# Patient Record
Sex: Male | Born: 1935 | Race: White | Hispanic: No | Marital: Married | Smoking: Former smoker
Health system: Southern US, Community
[De-identification: ages and names within clinical notes are randomized; demographics above are authoritative.]

## PROBLEM LIST (undated history)

## (undated) DIAGNOSIS — N1330 Unspecified hydronephrosis: Secondary | ICD-10-CM

## (undated) DIAGNOSIS — N400 Enlarged prostate without lower urinary tract symptoms: Secondary | ICD-10-CM

## (undated) DIAGNOSIS — I639 Cerebral infarction, unspecified: Secondary | ICD-10-CM

## (undated) DIAGNOSIS — E785 Hyperlipidemia, unspecified: Secondary | ICD-10-CM

## (undated) DIAGNOSIS — I2510 Atherosclerotic heart disease of native coronary artery without angina pectoris: Secondary | ICD-10-CM

## (undated) DIAGNOSIS — I10 Essential (primary) hypertension: Secondary | ICD-10-CM

## (undated) DIAGNOSIS — E119 Type 2 diabetes mellitus without complications: Secondary | ICD-10-CM

## (undated) DIAGNOSIS — R339 Retention of urine, unspecified: Secondary | ICD-10-CM

## (undated) DIAGNOSIS — N2889 Other specified disorders of kidney and ureter: Secondary | ICD-10-CM

## (undated) DIAGNOSIS — M1990 Unspecified osteoarthritis, unspecified site: Secondary | ICD-10-CM

## (undated) HISTORY — DX: Atherosclerotic heart disease of native coronary artery without angina pectoris: I25.10

## (undated) HISTORY — PX: BACK SURGERY: SHX140

## (undated) HISTORY — DX: Type 2 diabetes mellitus without complications: E11.9

## (undated) HISTORY — DX: Other specified disorders of kidney and ureter: N28.89

## (undated) HISTORY — DX: Unspecified hydronephrosis: N13.30

## (undated) HISTORY — DX: Cerebral infarction, unspecified: I63.9

## (undated) HISTORY — DX: Retention of urine, unspecified: R33.9

## (undated) HISTORY — DX: Benign prostatic hyperplasia without lower urinary tract symptoms: N40.0

## (undated) HISTORY — DX: Essential (primary) hypertension: I10

## (undated) HISTORY — DX: Unspecified osteoarthritis, unspecified site: M19.90

## (undated) HISTORY — DX: Hyperlipidemia, unspecified: E78.5

---

## 2005-04-02 ENCOUNTER — Ambulatory Visit: Payer: Self-pay | Admitting: Internal Medicine

## 2008-12-03 ENCOUNTER — Ambulatory Visit: Payer: Self-pay | Admitting: Neurology

## 2013-11-28 ENCOUNTER — Observation Stay: Payer: Self-pay | Admitting: Internal Medicine

## 2013-11-28 LAB — COMPREHENSIVE METABOLIC PANEL
Albumin: 3.5 g/dL (ref 3.4–5.0)
Alkaline Phosphatase: 69 U/L
Calcium, Total: 8.2 mg/dL — ABNORMAL LOW (ref 8.5–10.1)
Chloride: 108 mmol/L — ABNORMAL HIGH (ref 98–107)
Co2: 21 mmol/L (ref 21–32)
Creatinine: 0.71 mg/dL (ref 0.60–1.30)
Glucose: 132 mg/dL — ABNORMAL HIGH (ref 65–99)
Osmolality: 277 (ref 275–301)
Sodium: 135 mmol/L — ABNORMAL LOW (ref 136–145)

## 2013-11-28 LAB — CBC
HGB: 14.4 g/dL (ref 13.0–18.0)
MCH: 31.3 pg (ref 26.0–34.0)
MCHC: 34.1 g/dL (ref 32.0–36.0)
MCV: 92 fL (ref 80–100)
Platelet: 99 10*3/uL — ABNORMAL LOW (ref 150–440)
RBC: 4.6 10*6/uL (ref 4.40–5.90)
RDW: 13.2 % (ref 11.5–14.5)

## 2013-11-28 LAB — TROPONIN I
Troponin-I: <0.02 ng/mL
Troponin-I: <0.02 ng/mL

## 2013-11-28 LAB — URINALYSIS, COMPLETE
Bacteria: NONE SEEN
Glucose,UR: NEGATIVE mg/dL (ref 0–75)
Specific Gravity: 1.019 (ref 1.003–1.030)
WBC UR: NONE SEEN /HPF (ref 0–5)

## 2013-11-28 LAB — CK-MB
CK-MB: 1.5 ng/mL (ref 0.5–3.6)
CK-MB: 1.6 ng/mL (ref 0.5–3.6)

## 2013-11-29 LAB — CK-MB: CK-MB: 1.6 ng/mL (ref 0.5–3.6)

## 2013-11-29 LAB — TROPONIN I: Troponin-I: <0.02 ng/mL

## 2013-11-29 LAB — PLATELET COUNT: Platelet: 100 10*3/uL — ABNORMAL LOW (ref 150–440)

## 2013-12-03 LAB — CULTURE, BLOOD (SINGLE)

## 2014-06-01 DIAGNOSIS — M5416 Radiculopathy, lumbar region: Secondary | ICD-10-CM | POA: Insufficient documentation

## 2014-06-01 DIAGNOSIS — M545 Low back pain, unspecified: Secondary | ICD-10-CM | POA: Insufficient documentation

## 2014-06-18 ENCOUNTER — Ambulatory Visit: Payer: Self-pay | Admitting: Nurse Practitioner

## 2014-06-26 ENCOUNTER — Ambulatory Visit: Payer: Self-pay | Admitting: Family Medicine

## 2014-08-23 DIAGNOSIS — M5416 Radiculopathy, lumbar region: Secondary | ICD-10-CM | POA: Insufficient documentation

## 2014-08-23 DIAGNOSIS — M5136 Other intervertebral disc degeneration, lumbar region: Secondary | ICD-10-CM | POA: Insufficient documentation

## 2015-04-19 NOTE — H&P (Signed)
PATIENT NAME:  Matthew Schultz, Matthew Schultz MR#:  [id number] DATE OF BIRTH:  04/08/1932  DATE OF ADMISSION:  11/28/2013  PRIMARY CARE PHYSICIAN: Dr. Jarome Lamas.   CHIEF COMPLAINT: Dizziness, nausea and vomiting.   HISTORY OF PRESENT ILLNESS: Matthew Schultz is a 81 year old Caucasian gentleman with history of hypertension and TIA in the very remote past without any neuro deficit who complains of chronic dizziness on and off. Comes in today accompanied by his daughter with increasing dizziness, complaining of room spinning and some blurred vision associated with it. He vomited x 2 in the Emergency Room. ER physician, when he evaluated, the patient had some pronator drift on the left side along with weakness. However, during my evaluation, I could not see any pronator drift and the patient did not appear to have any weakness in his left upper or lower extremities.   Please note, the patient is quite a poor historian. He is hard of hearing. Daughter does not have much information regarding the patient's current symptoms.   The patient denies any falls or any trouble walking. He did fill unsteady secondary to dizziness today. Denies any ear pain or any ear discharge or upper respiratory tract infection. Given symptoms with ongoing dizziness, vomiting and possible TIA, the patient is being admitted for further evaluation and management.   PAST MEDICAL HISTORY:  1. Hypertension.  2. TIA. Marland Kitchen   ALLERGIES: No known drug allergies.   MEDICATIONS:  1. Vitamin B12 2000 mcg p.o. daily.  2. Os-Cal D 500 p.o. daily.  3. Motrin 200 mg 1 every 6 hours.  4. Meloxicam 7.5 mg p.o. daily.  5. Melatonin 3 mg daily at bedtime.  6. Garlique 1 tablet daily.  7. Gabapentin 300 mg daily at bedtime.  8. Doxazosin 2 mg p.o. daily at bedtime.  9. Centrum Silver 1 p.o. daily.  10. Aspirin 81 mg p.o. daily.   FAMILY HISTORY: Positive for hypertension and heart disease.   SOCIAL HISTORY: Lives at home with his daughter. Former  smoker, nonalcoholic.   REVIEW OF SYSTEMS:   CONSTITUTIONAL: No fever, fatigue. Positive for weakness.  EYES: Positive for some blurred vision during dizzy spell. No double vision, glaucoma or cataracts.  EARS, NOSE, THROAT: No tinnitus, ear pain, hearing loss or sinus pain.  RESPIRATORY: No cough, wheeze, hemoptysis, dyspnea or COPD.   CARDIOVASCULAR: No chest pain, orthopnea, edema or hypertension.  GASTROINTESTINAL: Positive for nausea and vomiting. No abdominal pain, melena or hematemesis.  GENITOURINARY: No dysuria or hematuria.  ENDOCRINE: No polyuria, nocturia or thyroid problems.  HEMATOLOGY: No anemia or easy bruising.  SKIN: No acne or rash.  MUSCULOSKELETAL: Positive for arthritis. No swelling or gout.  NEUROLOGIC: Positive for dizziness and weakness.   PSYCHIATRIC: No anxiety, depression or bipolar disorder.   All other systems reviewed and negative.   PHYSICAL EXAMINATION:  GENERAL: The patient is awake, alert, oriented x 3, not in acute distress.  VITAL SIGNS: Afebrile. Pulse is 50. Blood pressure is 118/64. Sats are 97% on room air.  HEENT: Atraumatic, normocephalic. RIGHT EYE: The patient has squinted eye. PERRLA. EOM intact. Oral mucosa is moist.  NECK: Supple. No JVD. No carotid bruit.  RESPIRATORY: Clear to auscultation bilaterally. No rales, rhonchi, respiratory distress or labored breathing.  HEART: Both of the heart sounds are normal. Rate, rhythm is regular. PMI not lateralized. Chest is nontender.  EXTREMITIES: Good pedal pulses. Good femoral pulses. No lower extremity edema.  ABDOMEN: Soft, benign, nontender. No organomegaly. Positive bowel sounds.  NEUROLOGIC: Grossly  intact cranial nerves II through XII. No motor or sensory deficit. The patient does not have any pronator drift in the left upper and lower extremities. The patient has reflexes 1+ in both upper and lower extremities. Normal sensory exam. Plantars are flexor bilaterally. No facial droop.  SKIN:  Warm and dry.   LABORATORY AND DIAGNOSTIC DATA: EKG shows sinus bradycardia.   UA negative for urinary tract infection.   CHEST X-RAY: No active disease, mild cardiomegaly.   CT OF THE HEAD: Shows no evidence of acute skull fractures. Mild diffuse cerebellar and cerebral atrophy. No evidence of ischemic or hemorrhagic infarction.   White count is 7.2, H and H is 14.4 and 42.2, platelet count is 99. Basic metabolic panel within normal limits except calcium of 8.2 and chloride of 108. Troponin is 0.02.   ASSESSMENT AND PLAN: Matthew Schultz is a 81 year old with history of hypertension and transient ischemic attack in the remote past. Comes in with:  1. Dizziness, acute on chronic, along with nausea and vomiting: Given history of transient ischemic attack in the past, we need to rule out another transient ischemic attack. Will continue aspirin for now. Will check MRI of the brain in the morning. Will continue neuro checks every 4 hourly. For dizziness, I will give him some meclizine around the clock along with Zofran for vomiting. Continue monitoring on telemetry.  2. Hypertension: The patient has relative low blood pressure at this time. I will hold off on his doxazosin.  3. Acute on chronic dizziness: This could be a possibility or vertigo. However, need to rule out any cerebellar stroke/transient ischemic attack. Will give him meclizine around the clock.  4. Generalized weakness: Will have physical therapy see the patient.  5. Further workup according to the patient's clinical course. Hospital admission plan was discussed with the patient and the patient's family members.   TIME SPENT: 50 minutes.   ____________________________ Hart Rochester Posey Pronto, MD sap:gb D: 11/28/2013 18:36:32 ET T: 11/28/2013 18:48:28 ET JOB#: [id number]  cc: Debanhi Blaker A. Posey Pronto, MD, <Dictator> Irven Easterly. Kary Kos, MD Ilda Basset MD ELECTRONICALLY SIGNED 12/08/2013 11:08

## 2015-04-19 NOTE — Discharge Summary (Signed)
PATIENT NAME:  Matthew Schultz, Matthew Schultz MR#:  [id number] DATE OF BIRTH:  02/02/1932  DATE OF ADMISSION:  11/28/2013 DATE OF DISCHARGE:  11/29/2013  ADMITTING DIAGNOSIS: Dizziness.   DISCHARGE DIAGNOSES: 1.  Dizziness.  2.  Bradycardia.  3.  Relative hypotension, resolved with IV fluid administration.  4.  Likely mild dehydration.  5.  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  6.  History of hypertension, transient ischemic attack.   DISCHARGE CONDITION: Stable.   DISCHARGE MEDICATIONS:  1.  The patient is to continue Motrin 200 mg every 6 hours as needed.  2.  Os-Cal with vitamin D 1 tablet once daily.  3.  Centrum Silver once daily.  4.  Melatonin 3 mg p.o. at bedtime.  5.  Gabapentin 300 mg p.o. at bedtime.  6.  Doxazosin 2 mg p.o. at bedtime.  7.  Meloxicam 7.5 mg p.o. daily as needed.  8.  Aspirin 81 mg p.o. daily.  9.  Simvastatin 40 mg p.o. bedtime.  10.  Lisinopril 2.5 mg p.o. daily.  11.  Meclizine 25 mg p.o. 3 times daily.  12.  Metoprolol extended-release 25 mg p.o. daily. This is a new medication. The patient was advised not to take his usual dose of metoprolol as well as Imdur until recommended by primary care physician.   HOME OXYGEN: None.   DIET: Low fat, low cholesterol. Salt was recommended for taste. Diet consistency regular.   ACTIVITY LIMITATIONS: As tolerated.   REFERRAL: To home health physical therapy.    FOLLOWUP APPOINTMENT: With Dr. Kary Kos in 2 days after discharge. Followup appointment with Dr. Manuella Ghazi on 11/30/2013 at 9:15 in his office in [facility].    CONSULTANTS: Care management, social work.   RADIOLOGIC STUDIES: Chest x-ray, portable single view, 2nd of December 2014 revealed no active disease, mild cardiomegaly. CT scan of head without contrast 2nd of December 2014 revealed no evidence of acute ischemic or hemorrhagic infarction, no intracranial mass effect. There was mild diffuse cerebral as well as  cerebellar atrophy with compensatory ventriculomegaly. No evidence of acute or chronic paranasal sinusitis. No evidence of acute skull fracture. MRI of brain with and without contrast on the 3rd of December 2014 showed no acute infarct, prominence of small vessel disease-type changes, global atrophy, ventricular prominence probably related to atrophy rather than hydrocephalus. No parenchymal enhancing lesion, right parietal calvarial enhancing lesion was noted. This has increased slowly since prior CT scan in 2009 when this had maximal dimension of 1.6 cm versus present at 2.1 cm. It is  possible that this represents a hemangioma. Metastatic disease or aggressive skull lesion felt to be less likely considerations given the lack of significant change in 4 years. Attention to this on followup appointment to radiologist.   HISTORY OF PRESENT ILLNESS: The patient is a 81 year old Caucasian male with past medical history significant for history of TIAs as well as hypertension who presents to the hospital with complaints of dizziness, nausea as well as vomiting. Please refer to Dr. Gus Height Patel's admission note on the 7th of December 2014. Apparently, the patient was evaluated by Emergency Room physician and was noted to have some pronator drift on the left as well as lower extremity weakness. He also was having some dizziness which was worse than in the past. On arrival to the hospital, to the Emergency Room, the patient's pulse was 50, blood pressure 118/64, saturation was 97% on room air. He was afebrile. Physical exam revealed right ICA deviation  towards the midline. Otherwise, he also had some left-sided weakness. EKG showed sinus brady. Lab data revealed mild elevation of BUN to 27, glucose 132, sodium 135, otherwise BMP was unremarkable. Liver enzymes were normal. Cardiac enzymes x 3 were within normal limits. CBC was normal except the platelet count was 99. Blood culture x 1 did not show any growth. Urinalysis was  unremarkable. The patient's CT scan of head was unremarkable as well as chest x-ray.   HOSPITAL COURSE:  The patient was admitted to the hospital for further evaluation because of concerns of possible stroke in the posterior fossa. The patient underwent an MRI of his brain done which showed a mass in his parietal region on the right. Upon this, I discussed patient's case with Dr. Manuella Ghazi, neurology, who recommended followup with him tomorrow, on the 4th of December 2014, in his office at 9:15 a.m. This was discussed with family as well as patient and they were agreeable. In regards to dizziness, the patient's dizziness resolved with therapy with  meclizine. The patient is to continue meclizine as needed. It was also felt that the patient's dizziness could have been related to relative bradycardia as well as hypotension. The patient's heart rate remained low in 50s to low 60s while he was in the hospital. He was also intermittently hypotensive with systolic blood pressure going below 100, somewhere around 99 to 50 range. It was felt that patient's beta blocker dose should be decreased to improve the patient's heart rate as well as possibly his blood pressure. For this reason, the patient's metoprolol 25 mg p.o. twice daily dose was decreased to Toprol-XL 25 mg p.o. daily dose. It is recommended to follow the patient's blood pressure readings and make decisions about advancement or decreasing medications even lower if needed.  The patient's hypotension, however, resolved with IV fluid administration.  It was felt that the patient very likely had mild dehydration. In regards to left-sided weakness, it was felt to be right parietal lobe mass-related since it worsened somewhat, increased in size.  Neurology consultation was requested and Dr. Manuella Ghazi was accepted the patient to see him in the office tomorrow, on the 4th of December 2014. In regards to hypertension, as mentioned above, the patient is to continue his  medications, however, some medication doses were decreased and Imdur was placed on hold until the patient is seen by primary care physician. The patient is being discharged in stable condition with the above-mentioned medications and followup.   TIME SPENT:  40 minutes.    ____________________________ Theodoro Grist, MD rv:cs D: 11/29/2013 16:03:00 ET T: 11/29/2013 18:45:18 ET JOB#: [id number]  cc: Theodoro Grist, MD, <Dictator> Irven Easterly. Kary Kos, MD Dr. Manuella Ghazi in [place] MD ELECTRONICALLY SIGNED 12/06/2013 15:34

## 2015-06-10 ENCOUNTER — Telehealth: Payer: Self-pay

## 2015-06-10 DIAGNOSIS — N400 Enlarged prostate without lower urinary tract symptoms: Secondary | ICD-10-CM

## 2015-06-10 MED ORDER — FINASTERIDE 5 MG PO TABS: 5.0000 mg | ORAL_TABLET | Freq: Every day | ORAL | Status: DC

## 2015-06-10 NOTE — Telephone Encounter (Signed)
Medication called into pharmacy. Cw,lpn

## 2015-08-09 ENCOUNTER — Telehealth: Payer: Self-pay

## 2015-08-09 MED ORDER — TAMSULOSIN HCL 0.4 MG PO CAPS: 0.4000 mg | ORAL_CAPSULE | Freq: Every day | ORAL | Status: DC

## 2015-08-09 NOTE — Telephone Encounter (Signed)
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 Pt pharmacy is requesting a refill on tamsulosin. Pt was last seen Jan 2016. Per Shannon's last note pt was supposed to return in 1mo but did not. Pt has bph with inreased PVR at each visit. Pt previously denied a foley, to learn CIC, and a cysto. Please advise.

## 2015-08-09 NOTE — Telephone Encounter (Signed)
He can have refill x 1 months but needs to arrange a follow up.    Script written but does not have pharmacy on file.    Hollice Espy, MD

## 2015-08-15 ENCOUNTER — Encounter: Payer: Self-pay | Admitting: Urology

## 2015-08-15 ENCOUNTER — Ambulatory Visit: Payer: Self-pay | Admitting: Urology

## 2015-11-12 ENCOUNTER — Emergency Department: Payer: Medicare PPO

## 2015-11-12 ENCOUNTER — Emergency Department
Admission: EM | Admit: 2015-11-12 | Discharge: 2015-11-12 | Disposition: A | Discharge status: Home / Self Care | Payer: Medicare PPO | Attending: Student | Admitting: Student

## 2015-11-12 ENCOUNTER — Encounter: Payer: Self-pay | Admitting: Emergency Medicine

## 2015-11-12 DIAGNOSIS — Z79899 Other long term (current) drug therapy: Secondary | ICD-10-CM | POA: Diagnosis not present

## 2015-11-12 DIAGNOSIS — G8929 Other chronic pain: Secondary | ICD-10-CM | POA: Insufficient documentation

## 2015-11-12 DIAGNOSIS — E119 Type 2 diabetes mellitus without complications: Secondary | ICD-10-CM | POA: Insufficient documentation

## 2015-11-12 DIAGNOSIS — I10 Essential (primary) hypertension: Secondary | ICD-10-CM | POA: Diagnosis not present

## 2015-11-12 DIAGNOSIS — Z87891 Personal history of nicotine dependence: Secondary | ICD-10-CM | POA: Diagnosis not present

## 2015-11-12 DIAGNOSIS — M545 Low back pain: Secondary | ICD-10-CM | POA: Insufficient documentation

## 2015-11-12 LAB — URINALYSIS COMPLETE WITH MICROSCOPIC (ARMC ONLY)
BILIRUBIN URINE: NEGATIVE
Bacteria, UA: NONE SEEN
GLUCOSE, UA: NEGATIVE mg/dL
HGB URINE DIPSTICK: NEGATIVE
KETONES UR: NEGATIVE mg/dL
LEUKOCYTES UA: NEGATIVE
Nitrite: NEGATIVE
PH: 7 (ref 5.0–8.0)
Protein, ur: NEGATIVE mg/dL
Specific Gravity, Urine: 1.013 (ref 1.005–1.030)
Squamous Epithelial / LPF: NONE SEEN

## 2015-11-12 MED ORDER — IBUPROFEN 400 MG PO TABS
400.0000 mg | ORAL_TABLET | Freq: Once | ORAL | Status: AC
  Administered 2015-11-12: 400 mg via ORAL
  Filled 2015-11-12: qty 1

## 2015-11-12 MED ORDER — ACETAMINOPHEN 325 MG PO TABS
650.0000 mg | ORAL_TABLET | Freq: Once | ORAL | Status: AC
  Administered 2015-11-12: 650 mg via ORAL
  Filled 2015-11-12: qty 2

## 2015-11-12 NOTE — ED Notes (Signed)
This RN walked pt in room, pt tolerated well using cane, states his back is hurting much less than on initial assessment. Dr Edd Fabian notified.

## 2015-11-12 NOTE — ED Provider Notes (Signed)
[facility] Emergency Department Provider Note  ____________________________________________  Time seen: Approximately 8:54 AM  I have reviewed the triage vital signs and the nursing notes.   HISTORY  Chief Complaint Back Pain    HPI Matthew Schultz is a 81 y.o. male with coronary artery disease, hypertension, BPH, diabetes, chronic back pain who presents for evaluation of an onset worsening back pain this morning, constant since onset but now improving spontaneously. Patient reports that he has had back pain "for years and years". This morning when he awoke from sleep he reports his pain was more "sharp" than usual and he was having such severe back pain that he was unable to get out of bed. For this reason EMS was called and he was transported to the emergency department. He reports that at this time his pain has improved to mention. He denies any bowel or bladder incontinence, or drug use, fevers, weakness. Currently his pain is mild. He denies any chest pain or abdominal pain, no vomiting or diarrhea.   Past Medical History  Diagnosis Date  . Arthritis   . HLD (hyperlipidemia)   . CAD (coronary artery disease)   . Stroke ([place])   . HTN (hypertension)   . Renal mass   . OA (osteoarthritis)   . DM (diabetes mellitus) ([place])   . Incomplete bladder emptying   . Hydronephrosis   . BPH (benign prostatic hyperplasia)     There are no active problems to display for this patient.   Past Surgical History  Procedure Laterality Date  . Back surgery      Current Outpatient Rx  Name  Route  Sig  Dispense  Refill  . donepezil (ARICEPT) 10 MG tablet   Oral   Take 10 mg by mouth at bedtime.         . finasteride (PROSCAR) 5 MG tablet   Oral   Take 1 tablet (5 mg total) by mouth daily.   30 tablet   3   . fluticasone (FLONASE) 50 MCG/ACT nasal spray   Each Nare   Place 2 sprays into both nostrils daily.          Marland Kitchen gabapentin (NEURONTIN) 300 MG  capsule   Oral   Take 300 mg by mouth at bedtime.          . isosorbide mononitrate (IMDUR) 30 MG 24 hr tablet   Oral   Take 30 mg by mouth daily.         Marland Kitchen lisinopril (PRINIVIL,ZESTRIL) 2.5 MG tablet   Oral   Take 2.5 mg by mouth daily.         . simvastatin (ZOCOR) 40 MG tablet   Oral   Take 40 mg by mouth every evening.          . tamsulosin (FLOMAX) 0.4 MG CAPS capsule   Oral   Take 1 capsule (0.4 mg total) by mouth daily.   30 capsule   0     Allergies Review of patient's allergies indicates no known allergies.  Family History  Problem Relation Age of Onset  . Kidney cancer Neg Hx   . Kidney disease Neg Hx   . Prostate cancer Neg Hx     Social History Social History  Substance Use Topics  . Smoking status: Former Smoker    Types: Cigarettes  . Smokeless tobacco: None  . Alcohol Use: No    Review of Systems Constitutional: No fever/chills Eyes: No visual changes. ENT: No  sore throat. Cardiovascular: Denies chest pain. Respiratory: Denies shortness of breath. Gastrointestinal: No abdominal pain.  No nausea, no vomiting.  No diarrhea.  No constipation. Genitourinary: Negative for dysuria. Musculoskeletal: + for back pain. Skin: Negative for rash. Neurological: Negative for headaches, focal weakness or numbness.  10-point ROS otherwise negative.  ____________________________________________   PHYSICAL EXAM:  VITAL SIGNS: ED Triage Vitals  Enc Vitals Group     BP 11/12/15 0843 133/97 mmHg     Pulse Rate 11/12/15 0843 60     Resp 11/12/15 0843 18     Temp 11/12/15 0843 97.6 F (36.4 C)     Temp Source 11/12/15 0843 Oral     SpO2 11/12/15 0843 100 %     Weight 11/12/15 0843 155 lb (70.308 kg)     Height 11/12/15 0843 5\' 8"  (1.727 m)     Head Cir --      Peak Flow --      Pain Score 11/12/15 0843 9     Pain Loc --      Pain Edu? --      Excl. in [place]? --     Constitutional: Alert and oriented. Well appearing and in no acute  distress. Eyes: Conjunctivae are normal. PERRL. EOMI. Head: Atraumatic. Nose: No congestion/rhinnorhea. Mouth/Throat: Mucous membranes are moist.  Oropharynx non-erythematous. Neck: No stridor.   Cardiovascular: Normal rate, regular rhythm. Grossly normal heart sounds.  Good peripheral circulation. Respiratory: Normal respiratory effort.  No retractions. Lungs CTAB. Gastrointestinal: Soft and nontender. No distention. No abdominal bruits. No CVA tenderness. Genitourinary: deferred Musculoskeletal: No lower extremity tenderness nor edema.  No joint effusions. Mild midline tenderness to palpation in the lumbar spine. Neurologic:  Normal speech and language. No gross focal neurologic deficits are appreciated. 5 out of 5 strength in bilateral upper and lower extremities, sensation intact to light touch throughout. Strong/normal dorsiflexion of the big toes bilaterally. Skin:  Skin is warm, dry and intact. No rash noted. Psychiatric: Mood and affect are normal. Speech and behavior are normal.  ____________________________________________   LABS (all labs ordered are listed, but only abnormal results are displayed)  Labs Reviewed  URINALYSIS COMPLETEWITH MICROSCOPIC ([place]) - Abnormal; Notable for the following:    Color, Urine YELLOW (*)    APPearance CLEAR (*)    All other components within normal limits   ____________________________________________  EKG  none ____________________________________________  RADIOLOGY  Lumbar plain films IMPRESSION: Levoscoliosis and degenerative disease in the lumbar spine. No acute bone abnormality.  ____________________________________________   PROCEDURES  Procedure(s) performed: None  Critical Care performed: No  ____________________________________________   INITIAL IMPRESSION / ASSESSMENT AND PLAN / ED COURSE  Pertinent labs & imaging results that were available during my care of the patient were reviewed by me and  considered in my medical decision making (see chart for details).  Matthew Schultz is a 81 y.o. male with coronary artery disease, hypertension, BPH, diabetes, chronic back pain who presents for evaluation of an onset worsening back pain this morning, constant since onset but now improving spontaneously. On exam, he is very well-appearing and in no acute distress. Vital signs stable, he is afebrile. He does have mild tenderness to palpation in the midline of the lumbar spine but intact neurological exam. Suspect exacerbation of his chronic back pain which is now improved without any intervention. We'll treat with Motrin and Tylenol and reassess for disposition. No read by concern for cauda equina or epidural abscess. The laterality of pain, no abdominal  pain or tenderness, doubt acute aortic pathology.  ----------------------------------------- 11:04 AM on 11/12/2015 -----------------------------------------  Patient reports that he has significant improvement of his pain at this time after motrin and tylenol. He is ambulatory with his cane without any assistance. He reports he feels quite well. Urinalysis is not consistent with infection. Plain films show no acute only abnormality. Discussed return precautions, need for close PCP follow-up and he is comfortable with the discharge plan. ____________________________________________   FINAL CLINICAL IMPRESSION(S) / ED DIAGNOSES  Final diagnoses:  Midline low back pain, with sciatica presence unspecified      Joanne Gavel, MD 11/12/15 1105

## 2015-11-12 NOTE — ED Notes (Signed)
Pt to ED via EMS with c/o severe lower back pain, states he has chronic back pain, but today its worse. Pt appears in no distress.

## 2015-11-12 NOTE — ED Notes (Signed)
Uses a cane for ambulation.

## 2016-07-29 ENCOUNTER — Other Ambulatory Visit: Payer: Self-pay | Admitting: Urology

## 2016-07-29 DIAGNOSIS — N400 Enlarged prostate without lower urinary tract symptoms: Secondary | ICD-10-CM

## 2016-07-30 ENCOUNTER — Telehealth: Payer: Self-pay

## 2016-07-30 NOTE — Telephone Encounter (Signed)
LMOM- pt needs an appt prior to refill of finasteride.

## 2016-07-31 NOTE — Telephone Encounter (Signed)
LMOM

## 2016-08-01 ENCOUNTER — Telehealth: Payer: Self-pay | Admitting: Urology

## 2016-08-01 NOTE — Telephone Encounter (Signed)
Patient is requesting a refill for finasteride, but he will need an office visit prior to this refill.

## 2016-08-03 NOTE — Telephone Encounter (Signed)
LMOM- will send a letter.  

## 2016-08-12 ENCOUNTER — Telehealth: Payer: Self-pay | Admitting: Urology

## 2016-08-12 NOTE — Telephone Encounter (Signed)
Spoke with pt daughter in reference to refill of medications. Made aware will need an office f/u prior to refills due to pt having not been seen within a year. Daughter voiced understanding. Daughter was transferred to the front to make f/u appt.

## 2016-09-07 ENCOUNTER — Encounter: Payer: Self-pay | Admitting: Urology

## 2016-09-07 ENCOUNTER — Ambulatory Visit (INDEPENDENT_AMBULATORY_CARE_PROVIDER_SITE_OTHER): Payer: Medicare PPO | Admitting: Urology

## 2016-09-07 VITALS — BP 138/85 | HR 84 | Ht 67.0 in | Wt 165.0 lb

## 2016-09-07 DIAGNOSIS — M1990 Unspecified osteoarthritis, unspecified site: Secondary | ICD-10-CM | POA: Insufficient documentation

## 2016-09-07 DIAGNOSIS — N400 Enlarged prostate without lower urinary tract symptoms: Secondary | ICD-10-CM | POA: Diagnosis not present

## 2016-09-07 DIAGNOSIS — I639 Cerebral infarction, unspecified: Secondary | ICD-10-CM | POA: Insufficient documentation

## 2016-09-07 DIAGNOSIS — I10 Essential (primary) hypertension: Secondary | ICD-10-CM | POA: Insufficient documentation

## 2016-09-07 DIAGNOSIS — N135 Crossing vessel and stricture of ureter without hydronephrosis: Secondary | ICD-10-CM

## 2016-09-07 DIAGNOSIS — E119 Type 2 diabetes mellitus without complications: Secondary | ICD-10-CM | POA: Insufficient documentation

## 2016-09-07 DIAGNOSIS — E785 Hyperlipidemia, unspecified: Secondary | ICD-10-CM | POA: Insufficient documentation

## 2016-09-07 DIAGNOSIS — N138 Other obstructive and reflux uropathy: Secondary | ICD-10-CM

## 2016-09-07 DIAGNOSIS — N401 Enlarged prostate with lower urinary tract symptoms: Secondary | ICD-10-CM

## 2016-09-07 DIAGNOSIS — I2510 Atherosclerotic heart disease of native coronary artery without angina pectoris: Secondary | ICD-10-CM | POA: Insufficient documentation

## 2016-09-07 DIAGNOSIS — H269 Unspecified cataract: Secondary | ICD-10-CM | POA: Insufficient documentation

## 2016-09-07 LAB — BLADDER SCAN AMB NON-IMAGING

## 2016-09-07 MED ORDER — FINASTERIDE 5 MG PO TABS: 5.0000 mg | ORAL_TABLET | Freq: Every day | ORAL | 4 refills | Status: DC

## 2016-09-07 MED ORDER — TAMSULOSIN HCL 0.4 MG PO CAPS: 0.4000 mg | ORAL_CAPSULE | Freq: Every day | ORAL | 4 refills | Status: AC

## 2016-09-07 NOTE — Progress Notes (Signed)
09/07/2016 11:02 AM   Matthew Schultz 04-03-1932 [id number]  Referring provider: No referring provider defined for this encounter.  Chief Complaint  Patient presents with  . Benign Prostatic Hypertrophy    follow up    HPI: Patient is an 82 year old Caucasian male who presents today for a yearly follow up for BPH with LUTS and a chronic right UPJ obstruction.    BPH WITH LUTS His IPSS score today is 12, which is moderate lower urinary tract symptomatology. He is pleased with his quality life due to his urinary symptoms.  His PVR is 127 mL.  His major complaint today is feeling of incomplete emptying.  He has had these symptoms for several years.  He denies any dysuria, hematuria or suprapubic pain.   He currently taking tamsulosin 0.4 mg and finasteride 5 mg daily.  He also denies any recent fevers, chills, nausea or vomiting.  He does not have a family history of PCa.  He did state that at his visit in 2015, he went home and vomited.  He has psychological issues with GU exams.        IPSS    Row Name 09/07/16 1000         International Prostate Symptom Score   How often have you had the sensation of not emptying your bladder? Almost always     How often have you had to urinate less than every two hours? Not at All     How often have you found you stopped and started again several times when you urinated? Less than 1 in 5 times     How often have you found it difficult to postpone urination? Less than half the time     How often have you had a weak urinary stream? Less than 1 in 5 times     How often have you had to strain to start urination? Not at All     How many times did you typically get up at night to urinate? 3 Times     Total IPSS Score 12       Quality of Life due to urinary symptoms   If you were to spend the rest of your life with your urinary condition just the way it is now how would you feel about that? Pleased        Score:  1-7 Mild 8-19  Moderate 20-35 Severe  Chronic right UPJ obstruction CT scan performed on 06/26/2014 noted a right pelvic kidney. Marked right hydronephrosis with decompressed right ureter. Findings most compatible with long-standing severe chronic UPJ obstruction. Right nephrolithiasis. Right adrenal adenoma. Coronary artery disease. His most recent serum creatinine was 1.3 in 08/2015.  He is not experiencing any flank pain or gross hematuria at this time.    PMH: Past Medical History:  Diagnosis Date  . Arthritis   . BPH (benign prostatic hyperplasia)   . CAD (coronary artery disease)   . DM (diabetes mellitus) ([place])   . HLD (hyperlipidemia)   . HTN (hypertension)   . Hydronephrosis   . Incomplete bladder emptying   . OA (osteoarthritis)   . Renal mass   . Stroke [facility])     Surgical History: Past Surgical History:  Procedure Laterality Date  . BACK SURGERY      Home Medications:    Medication List       Accurate as of 09/07/16 11:02 AM. Always use your most recent med list.  donepezil 10 MG tablet Commonly known as:  ARICEPT Take 10 mg by mouth at bedtime.   finasteride 5 MG tablet Commonly known as:  PROSCAR Take 1 tablet (5 mg total) by mouth daily.   fluticasone 50 MCG/ACT nasal spray Commonly known as:  FLONASE Place 2 sprays into both nostrils daily.   gabapentin 300 MG capsule Commonly known as:  NEURONTIN Take 300 mg by mouth at bedtime.   isosorbide mononitrate 30 MG 24 hr tablet Commonly known as:  IMDUR Take 30 mg by mouth daily.   lisinopril 2.5 MG tablet Commonly known as:  PRINIVIL,ZESTRIL Take 2.5 mg by mouth daily.   simvastatin 40 MG tablet Commonly known as:  ZOCOR Take 40 mg by mouth every evening.   tamsulosin 0.4 MG Caps capsule Commonly known as:  FLOMAX Take 1 capsule (0.4 mg total) by mouth daily.       Allergies: No Known Allergies  Family History: Family History  Problem Relation Age of Onset  . Kidney cancer Neg Hx   .  Kidney disease Neg Hx   . Prostate cancer Neg Hx     Social History:  reports that he has quit smoking. His smoking use included Cigarettes. His smokeless tobacco use includes Chew. He reports that he does not drink alcohol or use drugs.  ROS: UROLOGY Frequent Urination?: No Hard to postpone urination?: No Burning/pain with urination?: No Get up at night to urinate?: No Leakage of urine?: No Urine stream starts and stops?: No Trouble starting stream?: No Do you have to strain to urinate?: No Blood in urine?: No Urinary tract infection?: No Sexually transmitted disease?: No Injury to kidneys or bladder?: No Painful intercourse?: No Weak stream?: No Erection problems?: No Penile pain?: No  Gastrointestinal Nausea?: No Vomiting?: No Indigestion/heartburn?: No Diarrhea?: No Constipation?: No  Constitutional Fever: No Night sweats?: No Weight loss?: No Fatigue?: No  Skin Skin rash/lesions?: No Itching?: No  Eyes Blurred vision?: No Double vision?: No  Ears/Nose/Throat Sore throat?: No Sinus problems?: No  Hematologic/Lymphatic Swollen glands?: No Easy bruising?: No  Cardiovascular Leg swelling?: No Chest pain?: No  Respiratory Cough?: No Shortness of breath?: No  Endocrine Excessive thirst?: No  Musculoskeletal Back pain?: No Joint pain?: No  Neurological Headaches?: No Dizziness?: No  Psychologic Depression?: No Anxiety?: No  Physical Exam: BP 138/85   Pulse 84   Ht 5\' 7"  (1.702 m)   Wt 165 lb (74.8 kg)   BMI 25.84 kg/m   Constitutional: Well nourished. Alert and oriented, No acute distress. HEENT: [place] AT, moist mucus membranes. Trachea midline, no masses. Cardiovascular: No clubbing, cyanosis, or edema. Respiratory: Normal respiratory effort, no increased work of breathing. GI: Abdomen is soft, non tender, non distended, no abdominal masses. Liver and spleen not palpable.  No hernias appreciated.  Stool sample for occult testing is  not indicated.   GU: No CVA tenderness.  No bladder fullness or masses.  Patient with uncircumcised phallus. Foreskin easily retracted  Urethral meatus is patent.  No penile discharge. No penile lesions or rashes. Scrotum without lesions, cysts, rashes and/or edema.  Testicles are located scrotally bilaterally. No masses are appreciated in the testicles. Left and right epididymis are normal. Rectal: Patient with  normal sphincter tone. Anus and perineum without scarring or rashes. No rectal masses are appreciated. Prostate is approximately 45 grams, no nodules are appreciated. Seminal vesicles are normal. Skin: No rashes, bruises or suspicious lesions. Lymph: No cervical or inguinal adenopathy. Neurologic: Grossly intact, no focal  deficits, moving all 4 extremities. Psychiatric: Normal mood and affect.  Laboratory Data: Lab Results  Component Value Date   WBC 7.2 11/28/2013   HGB 14.4 11/28/2013   HCT 42.2 11/28/2013   MCV 92 11/28/2013   PLT 100 (L) 11/29/2013    Lab Results  Component Value Date   CREATININE 0.71 11/28/2013    Lab Results  Component Value Date   AST 28 11/28/2013   Lab Results  Component Value Date   ALT 25 11/28/2013      Pertinent Imaging: Results for RYANN, HANSBURY (MRN [id number]) as of 09/13/2016 15:59  Ref. Range 09/07/2016 10:58  Scan Result Unknown 140ml    Assessment & Plan:    1. BPH with LUTS  - IPSS score is 12/1   - Continue conservative management, avoiding bladder irritants and timed voiding's  - Continue tamsulosin 0.4 mg daily and finasteride 5 mg daily, refills given today  - RTC in 12 months for IPSS, PSA, PVR and exam   2. Chronic UPJ obstruction  - recheck BUN + creatinine  - follow up in one year  Return in about 1 year (around 09/07/2017) for IPSS, PVR, BUN + creatinine and exam.  These notes generated with voice recognition software. I apologize for typographical errors.  Zara Council, [facility] [address] [place], [place] [address] [phone]

## 2016-09-08 LAB — BUN+CREAT
BUN / CREAT RATIO: 24 (ref 10–24)
BUN: 25 mg/dL (ref 8–27)
CREATININE: 1.05 mg/dL (ref 0.76–1.27)
GFR, EST AFRICAN AMERICAN: 76 mL/min/{1.73_m2} (ref >59)
GFR, EST NON AFRICAN AMERICAN: 66 mL/min/{1.73_m2} (ref >59)

## 2016-10-19 ENCOUNTER — Emergency Department: Payer: Medicare PPO

## 2016-10-19 ENCOUNTER — Observation Stay
Admission: EM | Admit: 2016-10-19 | Discharge: 2016-10-20 | Disposition: A | Discharge status: Home / Self Care | Payer: Medicare PPO | Attending: Internal Medicine | Admitting: Internal Medicine

## 2016-10-19 ENCOUNTER — Encounter: Payer: Self-pay | Admitting: Emergency Medicine

## 2016-10-19 DIAGNOSIS — Z7951 Long term (current) use of inhaled steroids: Secondary | ICD-10-CM | POA: Diagnosis not present

## 2016-10-19 DIAGNOSIS — M1990 Unspecified osteoarthritis, unspecified site: Secondary | ICD-10-CM | POA: Diagnosis not present

## 2016-10-19 DIAGNOSIS — I2510 Atherosclerotic heart disease of native coronary artery without angina pectoris: Secondary | ICD-10-CM | POA: Diagnosis present

## 2016-10-19 DIAGNOSIS — I700 Atherosclerosis of aorta: Secondary | ICD-10-CM | POA: Diagnosis not present

## 2016-10-19 DIAGNOSIS — Z87891 Personal history of nicotine dependence: Secondary | ICD-10-CM | POA: Diagnosis not present

## 2016-10-19 DIAGNOSIS — N179 Acute kidney failure, unspecified: Secondary | ICD-10-CM | POA: Diagnosis not present

## 2016-10-19 DIAGNOSIS — Z23 Encounter for immunization: Secondary | ICD-10-CM | POA: Diagnosis not present

## 2016-10-19 DIAGNOSIS — E119 Type 2 diabetes mellitus without complications: Secondary | ICD-10-CM

## 2016-10-19 DIAGNOSIS — I10 Essential (primary) hypertension: Secondary | ICD-10-CM | POA: Diagnosis not present

## 2016-10-19 DIAGNOSIS — Z8673 Personal history of transient ischemic attack (TIA), and cerebral infarction without residual deficits: Secondary | ICD-10-CM | POA: Insufficient documentation

## 2016-10-19 DIAGNOSIS — E1136 Type 2 diabetes mellitus with diabetic cataract: Secondary | ICD-10-CM | POA: Insufficient documentation

## 2016-10-19 DIAGNOSIS — M6281 Muscle weakness (generalized): Secondary | ICD-10-CM

## 2016-10-19 DIAGNOSIS — N400 Enlarged prostate without lower urinary tract symptoms: Secondary | ICD-10-CM | POA: Diagnosis not present

## 2016-10-19 DIAGNOSIS — E785 Hyperlipidemia, unspecified: Secondary | ICD-10-CM | POA: Insufficient documentation

## 2016-10-19 DIAGNOSIS — I959 Hypotension, unspecified: Secondary | ICD-10-CM | POA: Diagnosis not present

## 2016-10-19 DIAGNOSIS — R55 Syncope and collapse: Principal | ICD-10-CM | POA: Insufficient documentation

## 2016-10-19 DIAGNOSIS — R2681 Unsteadiness on feet: Secondary | ICD-10-CM

## 2016-10-19 DIAGNOSIS — Z79899 Other long term (current) drug therapy: Secondary | ICD-10-CM | POA: Diagnosis not present

## 2016-10-19 LAB — HEPATIC FUNCTION PANEL
ALK PHOS: 65 U/L (ref 38–126)
ALT: 15 U/L — AB (ref 17–63)
AST: 24 U/L (ref 15–41)
Albumin: 4.1 g/dL (ref 3.5–5.0)
BILIRUBIN DIRECT: 0.2 mg/dL (ref 0.1–0.5)
BILIRUBIN INDIRECT: 0.7 mg/dL (ref 0.3–0.9)
TOTAL PROTEIN: 7.1 g/dL (ref 6.5–8.1)
Total Bilirubin: 0.9 mg/dL (ref 0.3–1.2)

## 2016-10-19 LAB — CBC
HEMATOCRIT: 43.1 % (ref 40.0–52.0)
HEMOGLOBIN: 14.9 g/dL (ref 13.0–18.0)
MCH: 31.4 pg (ref 26.0–34.0)
MCHC: 34.5 g/dL (ref 32.0–36.0)
MCV: 90.9 fL (ref 80.0–100.0)
Platelets: 162 10*3/uL (ref 150–440)
RBC: 4.74 MIL/uL (ref 4.40–5.90)
RDW: 13.3 % (ref 11.5–14.5)
WBC: 8.9 10*3/uL (ref 3.8–10.6)

## 2016-10-19 LAB — CBC WITH DIFFERENTIAL/PLATELET
BASOS ABS: 0 10*3/uL (ref 0–0.1)
Basophils Relative: 1 %
Eosinophils Absolute: 0.1 10*3/uL (ref 0–0.7)
Eosinophils Relative: 2 %
LYMPHS ABS: 1.5 10*3/uL (ref 1.0–3.6)
LYMPHS PCT: 16 %
Monocytes Absolute: 0.8 10*3/uL (ref 0.2–1.0)
Monocytes Relative: 9 %
NEUTROS PCT: 72 %
Neutro Abs: 6.6 10*3/uL — ABNORMAL HIGH (ref 1.4–6.5)

## 2016-10-19 LAB — BASIC METABOLIC PANEL
ANION GAP: 9 (ref 5–15)
BUN: 41 mg/dL — ABNORMAL HIGH (ref 6–20)
CALCIUM: 9 mg/dL (ref 8.9–10.3)
CO2: 23 mmol/L (ref 22–32)
Chloride: 105 mmol/L (ref 101–111)
Creatinine, Ser: 1.43 mg/dL — ABNORMAL HIGH (ref 0.61–1.24)
GFR, EST AFRICAN AMERICAN: 51 mL/min — AB (ref >60)
GFR, EST NON AFRICAN AMERICAN: 44 mL/min — AB (ref >60)
Glucose, Bld: 184 mg/dL — ABNORMAL HIGH (ref 65–99)
POTASSIUM: 4.1 mmol/L (ref 3.5–5.1)
SODIUM: 137 mmol/L (ref 135–145)

## 2016-10-19 LAB — TROPONIN I

## 2016-10-19 LAB — GLUCOSE, CAPILLARY: GLUCOSE-CAPILLARY: 122 mg/dL — AB (ref 65–99)

## 2016-10-19 MED ORDER — ONDANSETRON HCL 4 MG PO TABS: 4.0000 mg | ORAL_TABLET | Freq: Four times a day (QID) | ORAL | Status: DC | PRN

## 2016-10-19 MED ORDER — SODIUM CHLORIDE 0.9 % IV SOLN
INTRAVENOUS | Status: AC
  Administered 2016-10-19: 23:00:00 via INTRAVENOUS

## 2016-10-19 MED ORDER — DIPHENHYDRAMINE HCL 25 MG PO CAPS
25.0000 mg | ORAL_CAPSULE | Freq: Every evening | ORAL | Status: DC | PRN
  Administered 2016-10-19: 25 mg via ORAL
  Filled 2016-10-19: qty 1

## 2016-10-19 MED ORDER — SODIUM CHLORIDE 0.9% FLUSH
3.0000 mL | Freq: Two times a day (BID) | INTRAVENOUS | Status: DC
  Administered 2016-10-19 – 2016-10-20 (×2): 3 mL via INTRAVENOUS

## 2016-10-19 MED ORDER — ACETAMINOPHEN 650 MG RE SUPP: 650.0000 mg | Freq: Four times a day (QID) | RECTAL | Status: DC | PRN

## 2016-10-19 MED ORDER — FINASTERIDE 5 MG PO TABS
5.0000 mg | ORAL_TABLET | Freq: Every day | ORAL | Status: DC
  Administered 2016-10-20: 5 mg via ORAL
  Filled 2016-10-19: qty 1

## 2016-10-19 MED ORDER — ENOXAPARIN SODIUM 40 MG/0.4ML ~~LOC~~ SOLN
40.0000 mg | SUBCUTANEOUS | Status: DC
  Administered 2016-10-19: 40 mg via SUBCUTANEOUS
  Filled 2016-10-19: qty 0.4

## 2016-10-19 MED ORDER — ONDANSETRON HCL 4 MG/2ML IJ SOLN: 4.0000 mg | Freq: Four times a day (QID) | INTRAMUSCULAR | Status: DC | PRN

## 2016-10-19 MED ORDER — INFLUENZA VAC SPLIT QUAD 0.5 ML IM SUSY
0.5000 mL | PREFILLED_SYRINGE | INTRAMUSCULAR | Status: AC
  Administered 2016-10-20: 0.5 mL via INTRAMUSCULAR
  Filled 2016-10-19: qty 0.5

## 2016-10-19 MED ORDER — INSULIN ASPART 100 UNIT/ML ~~LOC~~ SOLN: 0.0000 [IU] | Freq: Every day | SUBCUTANEOUS | Status: DC

## 2016-10-19 MED ORDER — TAMSULOSIN HCL 0.4 MG PO CAPS
0.4000 mg | ORAL_CAPSULE | Freq: Every day | ORAL | Status: DC
  Administered 2016-10-20: 0.4 mg via ORAL
  Filled 2016-10-19: qty 1

## 2016-10-19 MED ORDER — ACETAMINOPHEN 325 MG PO TABS: 650.0000 mg | ORAL_TABLET | Freq: Four times a day (QID) | ORAL | Status: DC | PRN

## 2016-10-19 MED ORDER — SODIUM CHLORIDE 0.9 % IV BOLUS (SEPSIS)
1000.0000 mL | Freq: Once | INTRAVENOUS | Status: AC
Start: 2016-10-19 — End: 2016-10-19
  Administered 2016-10-19: 1000 mL via INTRAVENOUS

## 2016-10-19 MED ORDER — INSULIN ASPART 100 UNIT/ML ~~LOC~~ SOLN
0.0000 [IU] | Freq: Three times a day (TID) | SUBCUTANEOUS | Status: DC
  Administered 2016-10-20: 1 [IU] via SUBCUTANEOUS
  Administered 2016-10-20: 2 [IU] via SUBCUTANEOUS
  Filled 2016-10-19: qty 1
  Filled 2016-10-19: qty 2

## 2016-10-19 MED ORDER — SIMVASTATIN 40 MG PO TABS
40.0000 mg | ORAL_TABLET | Freq: Every evening | ORAL | Status: DC
  Administered 2016-10-19: 40 mg via ORAL
  Filled 2016-10-19: qty 1

## 2016-10-19 NOTE — ED Notes (Signed)
Pt brought in by daughter.  Family reports pt may have taken a double dose of blood pressure medicine today.  Blood pressure is low today.  Pt denies any pain.  Pt reports dizziness.  Pt has a headache.  md at bedside with family.  Pt alert.  Iv in place.

## 2016-10-19 NOTE — ED Provider Notes (Signed)
[facility] Emergency Department Provider Note   ____________________________________________   First MD Initiated Contact with Patient 10/19/16 1808     (approximate)  I have reviewed the triage vital signs and the nursing notes.   HISTORY  Chief Complaint Loss of Consciousness    HPI Matthew Schultz is a 82 y.o. male who comes in with his daughter. Reportedly he was sitting at the table and killed over past out onto the table. He became pale. He was out for not very long. Daughter reports that she saw that called 911 and he woke up spontaneously other still on the phone. At present patient says he just doesn't feel right. Nothing is hurting him he has no chest discomfort no shortness of breath is not sweating he "just doesn't feel right".  Past Medical History:  Diagnosis Date  . Arthritis   . BPH (benign prostatic hyperplasia)   . CAD (coronary artery disease)   . DM (diabetes mellitus) ([place])   . HLD (hyperlipidemia)   . HTN (hypertension)   . Hydronephrosis   . Incomplete bladder emptying   . OA (osteoarthritis)   . Renal mass   . Stroke [facility])     Patient Active Problem List   Diagnosis Date Noted  . Cataract 09/07/2016  . Coronary artery disease 09/07/2016  . Diabetes ([place]) 09/07/2016  . HTN (hypertension) 09/07/2016  . Hyperlipidemia 09/07/2016  . Osteoarthritis 09/07/2016  . Stroke ([place]) 09/07/2016  . DDD (degenerative disc disease), lumbar 08/23/2014  . Lumbar radiculitis 08/23/2014  . Low back pain 06/01/2014  . Lumbar radiculopathy 06/01/2014    Past Surgical History:  Procedure Laterality Date  . BACK SURGERY      Prior to Admission medications   Medication Sig Start Date End Date Taking? Authorizing Provider  donepezil (ARICEPT) 10 MG tablet Take 10 mg by mouth at bedtime.    Historical Provider, MD  finasteride (PROSCAR) 5 MG tablet Take 1 tablet (5 mg total) by mouth daily. 09/07/16   Larene Beach A McGowan, PA-C    fluticasone (FLONASE) 50 MCG/ACT nasal spray Place 2 sprays into both nostrils daily.     Historical Provider, MD  gabapentin (NEURONTIN) 300 MG capsule Take 300 mg by mouth at bedtime.     Historical Provider, MD  isosorbide mononitrate (IMDUR) 30 MG 24 hr tablet Take 30 mg by mouth daily.    Historical Provider, MD  lisinopril (PRINIVIL,ZESTRIL) 2.5 MG tablet Take 2.5 mg by mouth daily.    Historical Provider, MD  simvastatin (ZOCOR) 40 MG tablet Take 40 mg by mouth every evening.     Historical Provider, MD  tamsulosin (FLOMAX) 0.4 MG CAPS capsule Take 1 capsule (0.4 mg total) by mouth daily. 09/07/16   Nori Riis, PA-C    Allergies Review of patient's allergies indicates no known allergies.  Family History  Problem Relation Age of Onset  . Kidney cancer Neg Hx   . Kidney disease Neg Hx   . Prostate cancer Neg Hx     Social History Social History  Substance Use Topics  . Smoking status: Former Smoker    Types: Cigarettes  . Smokeless tobacco: Current User    Types: Chew  . Alcohol use No    Review of Systems Constitutional: No fever/chills Eyes: No visual changes. ENT: No sore throat. Cardiovascular: Denies chest pain. Respiratory: Denies shortness of breath. Gastrointestinal: No abdominal pain.  No nausea, no vomiting.  No diarrhea.  No constipation. Genitourinary: Negative for dysuria.  Musculoskeletal: Negative for back pain. Skin: Negative for rash. Neurological: Negative for headaches, focal weakness or numbness.  10-point ROS otherwise negative.  ____________________________________________   PHYSICAL EXAM:  VITAL SIGNS: ED Triage Vitals  Enc Vitals Group     BP 10/19/16 1754 (!) 74/47     Pulse Rate 10/19/16 1754 66     Resp 10/19/16 1754 16     Temp 10/19/16 1754 97.6 F (36.4 C)     Temp Source 10/19/16 1754 Oral     SpO2 10/19/16 1754 96 %     Weight 10/19/16 1753 160 lb (72.6 kg)     Height 10/19/16 1752 5\' 9"  (1.753 m)     Head  Circumference --      Peak Flow --      Pain Score 10/19/16 1753 0     Pain Loc --      Pain Edu? --      Excl. in [place]? --     Constitutional: Alert and oriented. Well appearing and in no acute distress. Eyes: Conjunctivae are normal. PERRL. EOMI.Patient's right eye turns in  chronically. Head: Atraumatic. Nose: No congestion/rhinnorhea. Mouth/Throat: Mucous membranes are moist.  Oropharynx non-erythematous. Neck: No stridor.  Cardiovascular: Normal rate, regular rhythm. Grossly normal heart sounds.  Good peripheral circulation. Respiratory: Normal respiratory effort.  No retractions. Lungs CTAB. Gastrointestinal: Soft and nontender. No distention. No abdominal bruits. No CVA tenderness. Musculoskeletal: No lower extremity tenderness nor edema.  No joint effusions. Neurologic:  Normal speech and language. No gross focal neurologic deficits are appreciated. Skin:  Skin is warm, dry and intact. No rash noted. Psychiatric: Mood and affect are normal. Speech and behavior are normal.  ____________________________________________   LABS (all labs ordered are listed, but only abnormal results are displayed)  Labs Reviewed  BASIC METABOLIC PANEL - Abnormal; Notable for the following:       Result Value   Glucose, Bld 184 (*)    BUN 41 (*)    Creatinine, Ser 1.43 (*)    GFR calc non Af Amer 44 (*)    GFR calc Af Amer 51 (*)    All other components within normal limits  CBC  URINALYSIS COMPLETEWITH MICROSCOPIC ([facility] ONLY)  TROPONIN I  CBC WITH DIFFERENTIAL/PLATELET  HEPATIC FUNCTION PANEL  CBG MONITORING, ED   ____________________________________________  EKG  EKG read and interpreted by me shows normal sinus rhythm rate of 75 normal axis low amplitude no change from EKG done by EMS. ____________________________________________  RADIOLOGY dy Result   CLINICAL DATA:  Syncope  EXAM: PORTABLE CHEST 1 VIEW  COMPARISON:  11/28/2013 chest radiograph  FINDINGS: Stable  cardiomediastinal silhouette with top-normal heart size and aortic atherosclerosis. No pneumothorax. No pleural effusion. Mild left basilar atelectasis. No pulmonary edema.  IMPRESSION: 1. Mild left basilar atelectasis. 2. Aortic atherosclerosis.   Electronically Signed   By: Ilona Sorrel M.D.   On: 10/19/2016 18:33      ____________________________________________   PROCEDURES  Procedure(s) performed:   Procedures  Critical Care performed:   ____________________________________________   INITIAL IMPRESSION / ASSESSMENT AND PLAN / ED COURSE  Pertinent labs & imaging results that were available during my care of the patient were reviewed by me and considered in my medical decision making (see chart for details).    Clinical Course   Blood pressures coming up slowly with fluids. Patient still feels well. We'll admit the patient  ____________________________________________   FINAL CLINICAL IMPRESSION(S) / ED DIAGNOSES  Final diagnoses:  Hypotension,  unspecified hypotension type  Syncope, unspecified syncope type      NEW MEDICATIONS STARTED DURING THIS VISIT:  New Prescriptions   No medications on file     Note:  This document was prepared using Dragon voice recognition software and may include unintentional dictation errors.    Nena Polio, MD 10/19/16 2035

## 2016-10-19 NOTE — ED Notes (Signed)
Pt removed own IV and was getting dressed with this RN came into room.  Daughter and patient educated that patient needed to stay until blood pressure increased.  Per MD, hung 1000cc NS bolus on patient.

## 2016-10-19 NOTE — H&P (Signed)
[place] [facility] [place] NAME: Matthew Schultz    MR#:  [id number]  DATE OF BIRTH:  1932-01-20  DATE OF ADMISSION:  10/19/2016  PRIMARY CARE PHYSICIAN: Maryland Pink, MD   REQUESTING/REFERRING PHYSICIAN: Cinda Quest, MD  CHIEF COMPLAINT:   Chief Complaint  Patient presents with  . Loss of Consciousness    HISTORY OF PRESENT ILLNESS:  Matthew Schultz  is a 82 y.o. male who presents with Syncopal episode at home. Patient and daughter state that he had no symptoms leading up to this. The daughter found him at the dinner table unresponsive for a brief period of time. He denies any prior history of cardiac issues, and denies any associated symptoms with this episode. Initial workup in the ED is largely negative except for some low blood pressure initially which responded well to IV hydration, and a little bit of [place] I. Hospitals were called for admission and further evaluation  PAST MEDICAL HISTORY:   Past Medical History:  Diagnosis Date  . Arthritis   . BPH (benign prostatic hyperplasia)   . CAD (coronary artery disease)   . DM (diabetes mellitus) ([place])   . HLD (hyperlipidemia)   . HTN (hypertension)   . Hydronephrosis   . Incomplete bladder emptying   . OA (osteoarthritis)   . Renal mass   . Stroke [facility])     PAST SURGICAL HISTORY:   Past Surgical History:  Procedure Laterality Date  . BACK SURGERY      SOCIAL HISTORY:   Social History  Substance Use Topics  . Smoking status: Former Smoker    Types: Cigarettes  . Smokeless tobacco: Current User    Types: Chew  . Alcohol use No    FAMILY HISTORY:   Family History  Problem Relation Age of Onset  . Pneumonia Father   . Kidney cancer Neg Hx   . Kidney disease Neg Hx   . Prostate cancer Neg Hx     DRUG ALLERGIES:  No Known Allergies  MEDICATIONS AT HOME:   Prior to Admission medications   Medication Sig Start Date End Date Taking? Authorizing Provider   finasteride (PROSCAR) 5 MG tablet Take 1 tablet (5 mg total) by mouth daily. 09/07/16  Yes Shannon A McGowan, PA-C  gabapentin (NEURONTIN) 300 MG capsule Take 300 mg by mouth at bedtime.    Yes Historical Provider, MD  isosorbide mononitrate (IMDUR) 30 MG 24 hr tablet Take 30 mg by mouth daily.   Yes Historical Provider, MD  lisinopril (PRINIVIL,ZESTRIL) 2.5 MG tablet Take 2.5 mg by mouth daily.   Yes Historical Provider, MD  metoprolol tartrate (LOPRESSOR) 25 MG tablet Take 25 mg by mouth 2 (two) times daily.   Yes Historical Provider, MD  simvastatin (ZOCOR) 40 MG tablet Take 40 mg by mouth every evening.    Yes Historical Provider, MD  tamsulosin (FLOMAX) 0.4 MG CAPS capsule Take 1 capsule (0.4 mg total) by mouth daily. 09/07/16  Yes Shannon A McGowan, PA-C  traMADol (ULTRAM) 50 MG tablet Take 50 mg by mouth every 8 (eight) hours as needed.   Yes Historical Provider, MD    REVIEW OF SYSTEMS:  Review of Systems  Constitutional: Negative for chills, fever, malaise/fatigue and weight loss.  HENT: Negative for ear pain, hearing loss and tinnitus.   Eyes: Negative for blurred vision, double vision, pain and redness.  Respiratory: Negative for cough, hemoptysis and shortness of breath.   Cardiovascular: Negative for chest pain, palpitations, orthopnea  and leg swelling.  Gastrointestinal: Negative for abdominal pain, constipation, diarrhea, nausea and vomiting.  Genitourinary: Negative for dysuria, frequency and hematuria.  Musculoskeletal: Negative for back pain, joint pain and neck pain.  Skin:       No acne, rash, or lesions  Neurological: Positive for loss of consciousness. Negative for dizziness, tremors, focal weakness and weakness.  Endo/Heme/Allergies: Negative for polydipsia. Does not bruise/bleed easily.  Psychiatric/Behavioral: Negative for depression. The patient is not nervous/anxious and does not have insomnia.      VITAL SIGNS:   Vitals:   10/19/16 2000 10/19/16 2015 10/19/16  2030 10/19/16 2102  BP: 97/60 104/65 99/63 105/65  Pulse:    (!) 58  Resp: 20 (!) 26 (!) 24 20  Temp:    97.9 F (36.6 C)  TempSrc:    Oral  SpO2:    96%  Weight:      Height:       Wt Readings from Last 3 Encounters:  10/19/16 72.6 kg (160 lb)  09/07/16 74.8 kg (165 lb)  11/12/15 70.3 kg (155 lb)    PHYSICAL EXAMINATION:  Physical Exam  Vitals reviewed. Constitutional: He is oriented to person, place, and time. He appears well-developed and well-nourished. No distress.  HENT:  Head: Normocephalic and atraumatic.  Mouth/Throat: Oropharynx is clear and moist.  Eyes: Conjunctivae and EOM are normal. Pupils are equal, round, and reactive to light. No scleral icterus.  Neck: Normal range of motion. Neck supple. No JVD present. No thyromegaly present.  Cardiovascular: Normal rate, regular rhythm and intact distal pulses.  Exam reveals no gallop and no friction rub.   No murmur heard. Respiratory: Effort normal and breath sounds normal. No respiratory distress. He has no wheezes. He has no rales.  GI: Soft. Bowel sounds are normal. He exhibits no distension. There is no tenderness.  Musculoskeletal: Normal range of motion. He exhibits no edema.  No arthritis, no gout  Lymphadenopathy:    He has no cervical adenopathy.  Neurological: He is alert and oriented to person, place, and time. No cranial nerve deficit.  No dysarthria, no aphasia  Skin: Skin is warm and dry. No rash noted. No erythema.  Psychiatric: He has a normal mood and affect. His behavior is normal. Judgment and thought content normal.    LABORATORY PANEL:   CBC  Recent Labs Lab 10/19/16 1757  WBC 8.9  HGB 14.9  HCT 43.1  PLT 162   ------------------------------------------------------------------------------------------------------------------  Chemistries   Recent Labs Lab 10/19/16 1757  NA 137  K 4.1  CL 105  CO2 23  GLUCOSE 184*  BUN 41*  CREATININE 1.43*  CALCIUM 9.0    ------------------------------------------------------------------------------------------------------------------  Cardiac Enzymes No results for input(s): TROPONINI in the last 168 hours. ------------------------------------------------------------------------------------------------------------------  RADIOLOGY:  Dg Chest Portable 1 View  Result Date: 10/19/2016 CLINICAL DATA:  Syncope EXAM: PORTABLE CHEST 1 VIEW COMPARISON:  11/28/2013 chest radiograph FINDINGS: Stable cardiomediastinal silhouette with top-normal heart size and aortic atherosclerosis. No pneumothorax. No pleural effusion. Mild left basilar atelectasis. No pulmonary edema. IMPRESSION: 1. Mild left basilar atelectasis. 2. Aortic atherosclerosis. Electronically Signed   By: Ilona Sorrel M.D.   On: 10/19/2016 18:33    EKG:   Orders placed or performed during the hospital encounter of 10/19/16  . EKG 12-Lead  . EKG 12-Lead  . ED EKG  . ED EKG    IMPRESSION AND PLAN:  Principal Problem:   Syncope - Unclear etiology, potentially related to some dehydration and low blood pressure.  We will trend his cardiac enzymes tonight, get an echocardiogram and a cardiology consult in the morning. Active Problems:   AKI (acute kidney injury) ([place]) - hydrate gently with IV fluids tonight, monitor for expected improvement, avoid nephrotoxins   Coronary artery disease - continue home meds   Diabetes ([place]) - sliding scale insulin with corresponding glucose checks and carb modified diet   HTN (hypertension) - we will hold his antihypertensives for now given that he is hypotensive on arrival and now low and normotensive   Hyperlipidemia - continue home meds  All the records are reviewed and case discussed with ED provider. Management plans discussed with the patient and/or family.  DVT PROPHYLAXIS: SubQ lovenox  GI PROPHYLAXIS: None  ADMISSION STATUS: Observation  CODE STATUS: Full Code Status History    This patient does not  have a recorded code status. Please follow your organizational policy for patients in this situation.      TOTAL TIME TAKING CARE OF THIS PATIENT: 40 minutes.    Matthew Schultz 10/19/2016, 9:19 PM  [facility]  Office  [phone]  CC: Primary care physician; Maryland Pink, MD

## 2016-10-19 NOTE — ED Triage Notes (Signed)
Syncope one hour.  Daughter states patient was eating supper and just leaned over table.  EMS called and ekg done.  BP low when checked by EMS.  Possible patient took a two blood pressure pills in error.

## 2016-10-20 ENCOUNTER — Observation Stay (HOSPITAL_BASED_OUTPATIENT_CLINIC_OR_DEPARTMENT_OTHER)
Admit: 2016-10-20 | Discharge: 2016-10-20 | Disposition: A | Discharge status: Home / Self Care | Payer: Medicare PPO | Attending: Internal Medicine | Admitting: Internal Medicine

## 2016-10-20 ENCOUNTER — Observation Stay: Payer: Medicare PPO

## 2016-10-20 DIAGNOSIS — R55 Syncope and collapse: Secondary | ICD-10-CM

## 2016-10-20 LAB — CBC
HCT: 39.4 % — ABNORMAL LOW (ref 40.0–52.0)
Hemoglobin: 13.9 g/dL (ref 13.0–18.0)
MCH: 31.9 pg (ref 26.0–34.0)
MCHC: 35.2 g/dL (ref 32.0–36.0)
MCV: 90.6 fL (ref 80.0–100.0)
PLATELETS: 123 10*3/uL — AB (ref 150–440)
RBC: 4.35 MIL/uL — AB (ref 4.40–5.90)
RDW: 13 % (ref 11.5–14.5)
WBC: 7.3 10*3/uL (ref 3.8–10.6)

## 2016-10-20 LAB — GLUCOSE, CAPILLARY
Glucose-Capillary: 122 mg/dL — ABNORMAL HIGH (ref 65–99)
Glucose-Capillary: 176 mg/dL — ABNORMAL HIGH (ref 65–99)

## 2016-10-20 LAB — TROPONIN I: Troponin I: <0.03 ng/mL (ref <0.03)

## 2016-10-20 LAB — ECHOCARDIOGRAM COMPLETE
Height: 69 in
Weight: 2508.8 oz

## 2016-10-20 LAB — BASIC METABOLIC PANEL
Anion gap: 5 (ref 5–15)
BUN: 35 mg/dL — AB (ref 6–20)
CO2: 26 mmol/L (ref 22–32)
Calcium: 8.8 mg/dL — ABNORMAL LOW (ref 8.9–10.3)
Chloride: 108 mmol/L (ref 101–111)
Creatinine, Ser: 1.02 mg/dL (ref 0.61–1.24)
Glucose, Bld: 122 mg/dL — ABNORMAL HIGH (ref 65–99)
POTASSIUM: 3.8 mmol/L (ref 3.5–5.1)
SODIUM: 139 mmol/L (ref 135–145)

## 2016-10-20 NOTE — Care Management Obs Status (Signed)
[place] NOTIFICATION   Patient Details  Name: DEMARIA EPTING MRN: [id number] Date of Birth: Feb 12, 1932   Medicare Observation Status Notification Given:  Yes    Beau Fanny, RN 10/20/2016, 8:58 AM

## 2016-10-20 NOTE — Discharge Summary (Signed)
Matthew Schultz, 82 y.o., DOB 1932/05/26, MRN [id number]. Admission date: 10/19/2016 Discharge Date 10/20/2016 Primary MD [place] Pink, MD Admitting Physician Lance Coon, MD  Admission Diagnosis  Hypotension, unspecified hypotension type [I95.9] Syncope, unspecified syncope type [R55]  Discharge Diagnosis   Principal Problem:   Syncope due to hypotension   Coronary artery disease   Diabetes ([place])   HTN (hypertension)   Hyperlipidemia   AKI (acute kidney injury) ([place])  Generalized weakness and deconditioning        Hospital Course Matthew Schultz  is a 82 y.o. male who presents with Syncopal episode at home. Patient and daughter state that he had no symptoms leading up to this. The daughter found him at the dinner table unresponsive for a brief period of time. He denies any prior history of cardiac issues, and denies any associated symptoms with this episode. She has blood pressure was little low on admission. He was also noticed acute kidney injury. Patient was admitted for IV fluids blood pressure meds were held. Patient was monitored overnight was admitted under observation. Telemetry showed no evidence of arrhythmia. Patient was not noted to be orthostatic. He had a echocardiogram which is currently pending but other than that patient is doing well. And denying any symptoms. He was seen by physical therapy and recommended home PT.            Consults  None  Significant Tests:  See full reports for all details    US Carotid Bilateral  Result Date: 10/20/2016 CLINICAL DATA:  Syncope. EXAM: BILATERAL CAROTID DUPLEX ULTRASOUND TECHNIQUE: Pearline Cables scale imaging, color Doppler and duplex ultrasound were performed of bilateral carotid and vertebral arteries in the neck. COMPARISON:  None. FINDINGS: Criteria: Quantification of carotid stenosis is based on velocity parameters that correlate the residual internal carotid diameter with NASCET-based stenosis levels, using the diameter  of the distal internal carotid lumen as the denominator for stenosis measurement. The following velocity measurements were obtained: RIGHT ICA:  79/13 cm/sec CCA:  [id number] cm/sec SYSTOLIC ICA/CCA RATIO:  1.1 DIASTOLIC ICA/CCA RATIO:  1.3 ECA:  9 7 cm/sec LEFT ICA:  73/14 cm/sec CCA:  [id number] cm/sec SYSTOLIC ICA/CCA RATIO:  1.2 DIASTOLIC ICA/CCA RATIO:  1.3 ECA:  78 cm/sec RIGHT CAROTID ARTERY: Mild right carotid bifurcation atherosclerotic vascular plaque. No flow limiting stenosis. RIGHT VERTEBRAL ARTERY:  Patent with antegrade flow . LEFT CAROTID ARTERY: Mild left carotid bifurcation atherosclerotic plaque. No flow limiting stenosis. LEFT VERTEBRAL ARTERY:  Patent antegrade flow. IMPRESSION: 1. Mild bilateral carotid bifurcation atherosclerotic vascular plaque. No flow limiting stenosis. Degree of stenosis less than 50% bilaterally. 2. Vertebral arteries are patent with antegrade flow. Electronically Signed   By: Marcello Moores  Register   On: 10/20/2016 11:06   Dg Chest Portable 1 View  Result Date: 10/19/2016 CLINICAL DATA:  Syncope EXAM: PORTABLE CHEST 1 VIEW COMPARISON:  11/28/2013 chest radiograph FINDINGS: Stable cardiomediastinal silhouette with top-normal heart size and aortic atherosclerosis. No pneumothorax. No pleural effusion. Mild left basilar atelectasis. No pulmonary edema. IMPRESSION: 1. Mild left basilar atelectasis. 2. Aortic atherosclerosis. Electronically Signed   By: Ilona Sorrel M.D.   On: 10/19/2016 18:33       Today   Subjective:   Matthew Schultz  patient is feeling better denies any complaints  Objective:   Blood pressure 128/65, pulse 62, temperature 97.5 F (36.4 C), temperature source Oral, resp. rate 19, height 5\' 9"  (1.753 m), weight 156 lb 12.8 oz (71.1 kg), SpO2 98 %.  .  Intake/Output Summary (  Last 24 hours) at 10/20/16 1242 Last data filed at 10/20/16 0730  Gross per 24 hour  Intake                0 ml  Output              550 ml  Net             -550 ml     Exam VITAL SIGNS: Blood pressure 128/65, pulse 62, temperature 97.5 F (36.4 C), temperature source Oral, resp. rate 19, height 5\' 9"  (1.753 m), weight 156 lb 12.8 oz (71.1 kg), SpO2 98 %.  GENERAL:  82 y.o.-year-old patient lying in the bed with no acute distress.  EYES: Pupils equal, round, reactive to light and accommodation. No scleral icterus. Extraocular muscles intact.  HEENT: Head atraumatic, normocephalic. Oropharynx and nasopharynx clear.  NECK:  Supple, no jugular venous distention. No thyroid enlargement, no tenderness.  LUNGS: Normal breath sounds bilaterally, no wheezing, rales,rhonchi or crepitation. No use of accessory muscles of respiration.  CARDIOVASCULAR: S1, S2 normal. No murmurs, rubs, or gallops.  ABDOMEN: Soft, nontender, nondistended. Bowel sounds present. No organomegaly or mass.  EXTREMITIES: No pedal edema, cyanosis, or clubbing.  NEUROLOGIC: Cranial nerves II through XII are intact. Muscle strength 5/5 in all extremities. Sensation intact. Gait not checked.  PSYCHIATRIC: The patient is alert and oriented x 3.  SKIN: No obvious rash, lesion, or ulcer.   Data Review     CBC w Diff: Lab Results  Component Value Date   WBC 7.3 10/20/2016   HGB 13.9 10/20/2016   HGB 14.4 11/28/2013   HCT 39.4 (L) 10/20/2016   HCT 42.2 11/28/2013   PLT 123 (L) 10/20/2016   PLT 100 (L) 11/29/2013   LYMPHOPCT 16 10/19/2016   MONOPCT 9 10/19/2016   EOSPCT 2 10/19/2016   BASOPCT 1 10/19/2016   CMP: Lab Results  Component Value Date   NA 139 10/20/2016   NA 141 11/29/2013   K 3.8 10/20/2016   K 4.2 11/28/2013   CL 108 10/20/2016   CL 108 (H) 11/28/2013   CO2 26 10/20/2016   CO2 21 11/28/2013   BUN 35 (H) 10/20/2016   BUN 25 09/07/2016   BUN 27 (H) 11/28/2013   CREATININE 1.02 10/20/2016   CREATININE 0.71 11/28/2013   PROT 7.1 10/19/2016   PROT 6.8 11/28/2013   ALBUMIN 4.1 10/19/2016   ALBUMIN 3.5 11/28/2013   BILITOT 0.9 10/19/2016   BILITOT 1.0 11/28/2013    ALKPHOS 65 10/19/2016   ALKPHOS 69 11/28/2013   AST 24 10/19/2016   AST 28 11/28/2013   ALT 15 (L) 10/19/2016   ALT 25 11/28/2013  .  Micro Results No results found for this or any previous visit (from the past 240 hour(s)).      Code Status Orders        Start     Ordered   10/19/16 2222  Full code  Continuous     10/19/16 2221    Code Status History    Date Active Date Inactive Code Status Order ID Comments User Context   This patient has a current code status but no historical code status.          Follow-up Information    [place] Pink, MD Follow up in 7 day(s).   Specialty:  Family Medicine Contact information: [address] [place] [address] [phone]        Teodoro Spray., MD. Daphane Shepherd  on 11/03/2016.   Specialty:  Cardiology Why:  at 1:15pm Contact information: [place] [address] [phone]           Discharge Medications     Medication List    STOP taking these medications   isosorbide mononitrate 30 MG 24 hr tablet Commonly known as:  IMDUR   lisinopril 2.5 MG tablet Commonly known as:  PRINIVIL,ZESTRIL   metoprolol tartrate 25 MG tablet Commonly known as:  LOPRESSOR     TAKE these medications   finasteride 5 MG tablet Commonly known as:  PROSCAR Take 1 tablet (5 mg total) by mouth daily.   gabapentin 300 MG capsule Commonly known as:  NEURONTIN Take 300 mg by mouth at bedtime.   simvastatin 40 MG tablet Commonly known as:  ZOCOR Take 40 mg by mouth every evening.   tamsulosin 0.4 MG Caps capsule Commonly known as:  FLOMAX Take 1 capsule (0.4 mg total) by mouth daily.   traMADol 50 MG tablet Commonly known as:  ULTRAM Take 50 mg by mouth every 8 (eight) hours as needed.          Total Time in preparing paper work, data evaluation and todays exam - 35 minutes  Dustin Flock M.D on 10/20/2016 at 12:42 PM  [facility]   Office  [phone]

## 2016-10-20 NOTE — Care Management Note (Signed)
Case Management Note  Patient Details  Name: BRODERIC CULLIMORE MRN: [id number] Date of Birth: 17-Aug-1932  Subjective/Objective:        Discussed discharge planning with Mr Latz who reports that he already has a front wheel walker at home.             Action/Plan:   Expected Discharge Date:                  Expected Discharge Plan:     In-House Referral:     Discharge planning Services     Post Acute Care Choice:    Choice offered to:     DME Arranged:    DME Agency:     HH Arranged:    HH Agency:     Status of Service:     If discussed at [employer] of Stay Meetings, dates discussed:    Additional Comments:  Kenesha Moshier A, RN 10/20/2016, 10:06 AM

## 2016-10-20 NOTE — Consult Note (Signed)
[facility] Cardiology  CARDIOLOGY CONSULT NOTE  Patient ID: Matthew Schultz MRN: [id number] DOB/AGE: 1932/04/25 82 y.o.  Admit date: 10/19/2016 Referring Physician Posey Pronto Primary Physician [facility] Primary Cardiologist Fath Reason for Consultation Syncope  HPI: 82 year old gentleman referred for evaluation of syncope. The patient has known coronary artery disease. He was in his usual state of health until day of admission when he experienced brief loss of consciousness while sitting at the dinner table. The patient reports that he felt nauseous are to the event, and had brief loss of consciousness. The patient was admitted to telemetry for observation. He has remained in normal sinus rhythm. He has ruled out for myocardial infarction with negative troponin 2. He denies chest pain or shortness of breath.  Review of systems complete and found to be negative unless listed above     Past Medical History:  Diagnosis Date  . Arthritis   . BPH (benign prostatic hyperplasia)   . CAD (coronary artery disease)   . DM (diabetes mellitus) ([place])   . HLD (hyperlipidemia)   . HTN (hypertension)   . Hydronephrosis   . Incomplete bladder emptying   . OA (osteoarthritis)   . Renal mass   . Stroke [facility])     Past Surgical History:  Procedure Laterality Date  . BACK SURGERY      Prescriptions Prior to Admission  Medication Sig Dispense Refill Last Dose  . finasteride (PROSCAR) 5 MG tablet Take 1 tablet (5 mg total) by mouth daily. 90 tablet 4 10/19/2016 at Unknown time  . gabapentin (NEURONTIN) 300 MG capsule Take 300 mg by mouth at bedtime.    10/18/2016 at Unknown time  . isosorbide mononitrate (IMDUR) 30 MG 24 hr tablet Take 30 mg by mouth daily.   10/19/2016 at Unknown time  . lisinopril (PRINIVIL,ZESTRIL) 2.5 MG tablet Take 2.5 mg by mouth daily.   10/19/2016 at Unknown time  . metoprolol tartrate (LOPRESSOR) 25 MG tablet Take 25 mg by mouth 2 (two) times daily.   10/19/2016 at Unknown time  .  simvastatin (ZOCOR) 40 MG tablet Take 40 mg by mouth every evening.    10/18/2016 at Unknown time  . tamsulosin (FLOMAX) 0.4 MG CAPS capsule Take 1 capsule (0.4 mg total) by mouth daily. 90 capsule 4 10/19/2016 at Unknown time  . traMADol (ULTRAM) 50 MG tablet Take 50 mg by mouth every 8 (eight) hours as needed.   prn at prn   Social History   Social History  . Marital status: Married    Spouse name: N/A  . Number of children: N/A  . Years of education: N/A   Occupational History  . Not on file.   Social History Main Topics  . Smoking status: Former Smoker    Types: Cigarettes  . Smokeless tobacco: Current User    Types: Chew  . Alcohol use No  . Drug use: No  . Sexual activity: Not on file   Other Topics Concern  . Not on file   Social History Narrative  . No narrative on file    Family History  Problem Relation Age of Onset  . Pneumonia Father   . Kidney cancer Neg Hx   . Kidney disease Neg Hx   . Prostate cancer Neg Hx       Review of systems complete and found to be negative unless listed above      PHYSICAL EXAM  General: Well developed, well nourished, in no acute distress HEENT:  Normocephalic and atramatic Neck:  No JVD.  Lungs: Clear bilaterally to auscultation and percussion. Heart: HRRR . Normal S1 and S2 without gallops or murmurs.  Abdomen: Bowel sounds are positive, abdomen soft and non-tender  Msk:  Back normal, normal gait. Normal strength and tone for age. Extremities: No clubbing, cyanosis or edema.   Neuro: Alert and oriented X 3. Psych:  Good affect, responds appropriately  Labs:   Lab Results  Component Value Date   WBC 7.3 10/20/2016   HGB 13.9 10/20/2016   HCT 39.4 (L) 10/20/2016   MCV 90.6 10/20/2016   PLT 123 (L) 10/20/2016    Recent Labs Lab 10/19/16 1757 10/20/16 0439  NA 137 139  K 4.1 3.8  CL 105 108  CO2 23 26  BUN 41* 35*  CREATININE 1.43* 1.02  CALCIUM 9.0 8.8*  PROT 7.1  --   BILITOT 0.9  --   ALKPHOS 65   --   ALT 15*  --   AST 24  --   GLUCOSE 184* 122*   Lab Results  Component Value Date   CKMB 1.6 11/29/2013   TROPONINI <0.03 10/20/2016   No results found for: CHOL No results found for: HDL No results found for: LDLCALC No results found for: TRIG No results found for: CHOLHDL No results found for: LDLDIRECT    Radiology: Dg Chest Portable 1 View  Result Date: 10/19/2016 CLINICAL DATA:  Syncope EXAM: PORTABLE CHEST 1 VIEW COMPARISON:  11/28/2013 chest radiograph FINDINGS: Stable cardiomediastinal silhouette with top-normal heart size and aortic atherosclerosis. No pneumothorax. No pleural effusion. Mild left basilar atelectasis. No pulmonary edema. IMPRESSION: 1. Mild left basilar atelectasis. 2. Aortic atherosclerosis. Electronically Signed   By: Ilona Sorrel M.D.   On: 10/19/2016 18:33    EKG: Normal sinus rhythm  ASSESSMENT AND PLAN:   1. Syncope, consistent with vasovagal episode, without recurrence, with normal sinus rhythm 2. Known CAD, negative troponin, without chest pain  Recommendations  1. Agree with current therapy 2. Review 2-D echocardiogram 3. Defer further cardiac diagnostics at this time 4. If patient does well today, ambulates without difficulty, may consider discharge home and follow-up with Dr. Ubaldo Glassing  Signed: Isaias Cowman MD,PhD, [facility] 10/20/2016, 9:28 AM

## 2016-10-20 NOTE — Discharge Instructions (Signed)
°  DIET:  Cardiac diet  DISCHARGE CONDITION:  Stable  ACTIVITY:  Activity as tolerated  OXYGEN:  Home Oxygen: No.   Oxygen Delivery:  none  DISCHARGE LOCATION:  home    ADDITIONAL DISCHARGE INSTRUCTION: don't take blood pressure  medication until  Seen by primary physicain.   If you experience worsening of your admission symptoms, develop shortness of breath, life threatening emergency, suicidal or homicidal thoughts you must seek medical attention immediately by calling 911 or calling your MD immediately  if symptoms less severe.  You Must read complete instructions/literature along with all the possible adverse reactions/side effects for all the Medicines you take and that have been prescribed to you. Take any new Medicines after you have completely understood and accpet all the possible adverse reactions/side effects.   Please note  You were cared for by a hospitalist during your hospital stay. If you have any questions about your discharge medications or the care you received while you were in the hospital after you are discharged, you can call the unit and asked to speak with the hospitalist on call if the hospitalist that took care of you is not available. Once you are discharged, your primary care physician will handle any further medical issues. Please note that NO REFILLS for any discharge medications will be authorized once you are discharged, as it is imperative that you return to your primary care physician (or establish a relationship with a primary care physician if you do not have one) for your aftercare needs so that they can reassess your need for medications and monitor your lab values.

## 2016-10-20 NOTE — Progress Notes (Signed)
*  PRELIMINARY RESULTS* Echocardiogram 2D Echocardiogram has been performed.  Sherrie Sport 10/20/2016, 12:28 PM

## 2016-10-20 NOTE — Progress Notes (Signed)
Patient discharged to home with proper discharge instructions, education and follow-up appointments. Patient was discharged via wheelchair with family care. Follow up instructions were given to patient and patients daughter who had no further questions after education. No new medications were started at this time.

## 2016-10-20 NOTE — Care Management Note (Signed)
Case Management Note  Patient Details  Name: Matthew Schultz MRN: [id number] Date of Birth: Jul 09, 1932  Subjective/Objective:       Discussed discharge planning with [name] and his wife. They chose [facility] to be [name]'s home health PT provider. A referral was texted to Floydene Flock at [facility]. [name] already has a RW at home per his report.              Action/Plan:   Expected Discharge Date:                  Expected Discharge Plan:     In-House Referral:     Discharge planning Services     Post Acute Care Choice:    Choice offered to:     DME Arranged:    DME Agency:     HH Arranged:    HH Agency:     Status of Service:     If discussed at [employer] of Stay Meetings, dates discussed:    Additional Comments:  Harli Engelken A, RN 10/20/2016, 11:14 AM

## 2016-10-20 NOTE — Evaluation (Signed)
Physical Therapy Evaluation Patient Details Name: Matthew Schultz MRN: [id number] DOB: 06-Mar-1932 Today's Date: 10/20/2016   History of Present Illness  Pt admitted for complaints of loss of consciousness at home. Pt with negative orthostatics this session. History includes CAD, DM, HTN, OA, and CVA.  Clinical Impression  Pt is a pleasant 82 year old male who was admitted for syncope. Orthostatics BP: supine: 130/68; seated: 130/76; standing: 128/68. RN informed. Pt performs bed mobility with independence, transfers with cga, and ambulation with cga and RW. Pt reports dizziness with mobility, however no LOB noted with RW. Would recommend use of RW at this time to improve balance and decrease fall risk.  Pt demonstrates deficits with strength/mobility/balance. Pt also with R eye inward gaze, appears crossing midline. Would benefit from skilled PT to address above deficits and promote optimal return to PLOF. Recommend transition to [place] upon discharge from acute hospitalization.       Follow Up Recommendations Home health PT    Equipment Recommendations  Rolling walker with 5" wheels    Recommendations for Other Services       Precautions / Restrictions Precautions Precautions: Fall Restrictions Weight Bearing Restrictions: No      Mobility  Bed Mobility Overal bed mobility: Independent             General bed mobility comments: safe technique performed with no assistance  Transfers Overall transfer level: Needs assistance Equipment used: Rolling walker (2 wheeled) Transfers: Sit to/from Stand Sit to Stand: Min guard         General transfer comment: safe technique performed with RW. No difficulty noted  Ambulation/Gait Ambulation/Gait assistance: Min guard Ambulation Distance (Feet): 200 Feet Assistive device: Rolling walker (2 wheeled) Gait Pattern/deviations: Step-through pattern     General Gait Details: ambulated using reciprocal gait pattern with slight  unsteadiness while ambulating around turns. Good gait speed noted, ambulating 10' in 5 seconds.  Stairs            Wheelchair Mobility    Modified Rankin (Stroke Patients Only)       Balance Overall balance assessment: Needs assistance Sitting-balance support: Feet supported;No upper extremity supported Sitting balance-Leahy Scale: Normal     Standing balance support: Bilateral upper extremity supported Standing balance-Leahy Scale: Good                               Pertinent Vitals/Pain Pain Assessment: No/denies pain    Home Living Family/patient expects to be discharged to:: Private residence Living Arrangements: Spouse/significant other Available Help at Discharge: Family Type of Home: House Home Access: Stairs to enter Entrance Stairs-Rails: Can reach both Entrance Stairs-Number of Steps: 4 Home Layout: One level Home Equipment: Cane - single point      Prior Function Level of Independence: Independent with assistive device(s)         Comments: Pt comments he is very active at baseline     Hand Dominance        Extremity/Trunk Assessment   Upper Extremity Assessment: Overall WFL for tasks assessed           Lower Extremity Assessment: Generalized weakness (B LE grossly 4/5)         Communication   Communication: No difficulties  Cognition Arousal/Alertness: Awake/alert Behavior During Therapy: WFL for tasks assessed/performed Overall Cognitive Status: Within Functional Limits for tasks assessed  General Comments      Exercises Other Exercises Other Exercises: seated ther-ex performed including B LE LAQ, alt marching, and SLRs. All ther-ex performed x 10 reps with cga. Safe technique performed   Assessment/Plan    PT Assessment Patient needs continued PT services  PT Problem List Decreased strength;Decreased activity tolerance;Decreased balance;Decreased mobility          PT  Treatment Interventions Gait training;DME instruction;Therapeutic exercise    PT Goals (Current goals can be found in the Care Plan section)  Acute Rehab PT Goals Patient Stated Goal: to go home PT Goal Formulation: With patient Time For Goal Achievement: 11/03/16 Potential to Achieve Goals: Good    Frequency Min 2X/week   Barriers to discharge        Co-evaluation               End of Session Equipment Utilized During Treatment: Gait belt Activity Tolerance: Patient tolerated treatment well Patient left: in chair;with chair alarm set Nurse Communication: Mobility status    Functional Assessment Tool Used: gait speed Functional Limitation: Mobility: Walking and moving around Mobility: Walking and Moving Around Current Status [phone]): At least 1 percent but less than 20 percent impaired, limited or restricted Mobility: Walking and Moving Around Goal Status [phone]): 0 percent impaired, limited or restricted    Time: [id number] PT Time Calculation (min) (ACUTE ONLY): 24 min   Charges:   PT Evaluation $PT Eval Low Complexity: 1 Procedure PT Treatments $Therapeutic Exercise: 8-22 mins   PT G Codes:   PT G-Codes **NOT FOR INPATIENT CLASS** Functional Assessment Tool Used: gait speed Functional Limitation: Mobility: Walking and moving around Mobility: Walking and Moving Around Current Status [id number]): At least 1 percent but less than 20 percent impaired, limited or restricted Mobility: Walking and Moving Around Goal Status [phone]): 0 percent impaired, limited or restricted    Matthew Schultz 10/20/2016, 11:19 AM  Greggory Stallion, PT, DPT [phone]

## 2016-10-20 NOTE — Care Management Note (Signed)
Case Management Note  Patient Details  Name: Matthew Schultz MRN: [id number] Date of Birth: 05/08/1932  Subjective/Objective:      Daughter Colletta [place] arrived to transport Mr Churchill home. Colletta [place] reported that she does not want to use [facility] and chose [facility]. This Probation officer called Malachy Mood at [employer] who stated that [facility] could offer more services through [employer] than [facility] can. So a referral was made to [facility] for home health PT. This Probation officer updated daughter Colletta [place] that we would honor her choice from the list of providers, but would have to go with the provider who would accept Mr [employer].               Action/Plan:   Expected Discharge Date:                  Expected Discharge Plan:     In-House Referral:     Discharge planning Services     Post Acute Care Choice:    Choice offered to:     DME Arranged:    DME Agency:     HH Arranged:    HH Agency:     Status of Service:     If discussed at [employer] of Stay Meetings, dates discussed:    Additional Comments:  Lijah Bourque A, RN 10/20/2016, 1:39 PM

## 2017-09-07 ENCOUNTER — Ambulatory Visit: Payer: Medicare PPO | Admitting: Urology

## 2017-09-30 ENCOUNTER — Other Ambulatory Visit: Payer: Self-pay | Admitting: Urology

## 2018-01-06 ENCOUNTER — Other Ambulatory Visit: Payer: Self-pay

## 2018-01-06 ENCOUNTER — Encounter: Payer: Self-pay | Admitting: Emergency Medicine

## 2018-01-06 ENCOUNTER — Emergency Department
Admission: EM | Admit: 2018-01-06 | Discharge: 2018-01-06 | Disposition: A | Discharge status: Home / Self Care | Payer: Medicare PPO | Attending: Emergency Medicine | Admitting: Emergency Medicine

## 2018-01-06 DIAGNOSIS — Z79899 Other long term (current) drug therapy: Secondary | ICD-10-CM | POA: Insufficient documentation

## 2018-01-06 DIAGNOSIS — I2510 Atherosclerotic heart disease of native coronary artery without angina pectoris: Secondary | ICD-10-CM | POA: Diagnosis not present

## 2018-01-06 DIAGNOSIS — I10 Essential (primary) hypertension: Secondary | ICD-10-CM | POA: Insufficient documentation

## 2018-01-06 DIAGNOSIS — R339 Retention of urine, unspecified: Secondary | ICD-10-CM | POA: Diagnosis present

## 2018-01-06 DIAGNOSIS — Z8673 Personal history of transient ischemic attack (TIA), and cerebral infarction without residual deficits: Secondary | ICD-10-CM | POA: Diagnosis not present

## 2018-01-06 DIAGNOSIS — E119 Type 2 diabetes mellitus without complications: Secondary | ICD-10-CM | POA: Insufficient documentation

## 2018-01-06 DIAGNOSIS — E860 Dehydration: Secondary | ICD-10-CM | POA: Diagnosis not present

## 2018-01-06 DIAGNOSIS — R311 Benign essential microscopic hematuria: Secondary | ICD-10-CM | POA: Insufficient documentation

## 2018-01-06 DIAGNOSIS — Z87891 Personal history of nicotine dependence: Secondary | ICD-10-CM | POA: Diagnosis not present

## 2018-01-06 DIAGNOSIS — R300 Dysuria: Secondary | ICD-10-CM

## 2018-01-06 LAB — URINALYSIS, COMPLETE (UACMP) WITH MICROSCOPIC
BACTERIA UA: NONE SEEN
Bilirubin Urine: NEGATIVE
GLUCOSE, UA: NEGATIVE mg/dL
KETONES UR: NEGATIVE mg/dL
LEUKOCYTES UA: NEGATIVE
Nitrite: NEGATIVE
Specific Gravity, Urine: 1.025 (ref 1.005–1.030)
pH: 7 (ref 5.0–8.0)

## 2018-01-06 NOTE — ED Triage Notes (Addendum)
Pt to ed with c/o urinary retention x 2 days per family member states dry pull up last night and dry pull up this am.  Hx of urine retention.  Pt has foul odor of urine noted.  Pt with hx of dementia.  Pt denies pain at this time.

## 2018-01-06 NOTE — ED Notes (Signed)
See triage note. Brought in to ed by family. States he is not able to empty bladder   Voiding small amts at a time

## 2018-01-06 NOTE — ED Notes (Signed)
Bladder scan at triage done: 332 ml noted in bladder

## 2018-01-06 NOTE — ED Provider Notes (Signed)
[facility] Emergency Department Provider Note   ____________________________________________   First MD Initiated Contact with Patient 01/06/18 1214     (approximate)  I have reviewed the triage vital signs and the nursing notes.   HISTORY Son Chief Complaint  Urinary Retention    HPI Matthew Schultz is a 84 y.o. male patient presents with urinary retention times 2 days family members.  His son states that his pull-up diapers are dry but they has noticed a foul odor of urine.  Patient has a history of dementia..  Patient he states he is urinating "but on a little bit comes out".  Family member state there is a history of urinary retention.  Patient denies pain.  Bladder scan shows approximately 300+ milliliters of urine bladder.   Past Medical History:  Diagnosis Date  . Arthritis   . BPH (benign prostatic hyperplasia)   . CAD (coronary artery disease)   . DM (diabetes mellitus) ([place])   . HLD (hyperlipidemia)   . HTN (hypertension)   . Hydronephrosis   . Incomplete bladder emptying   . OA (osteoarthritis)   . Renal mass   . Stroke [facility])     Patient Active Problem List   Diagnosis Date Noted  . Syncope 10/19/2016  . AKI (acute kidney injury) ([place]) 10/19/2016  . Cataract 09/07/2016  . Coronary artery disease 09/07/2016  . Diabetes ([place]) 09/07/2016  . HTN (hypertension) 09/07/2016  . Hyperlipidemia 09/07/2016  . Osteoarthritis 09/07/2016  . Stroke ([place]) 09/07/2016  . DDD (degenerative disc disease), lumbar 08/23/2014  . Lumbar radiculitis 08/23/2014  . Low back pain 06/01/2014  . Lumbar radiculopathy 06/01/2014    Past Surgical History:  Procedure Laterality Date  . BACK SURGERY      Prior to Admission medications   Medication Sig Start Date End Date Taking? Authorizing Provider  finasteride (PROSCAR) 5 MG tablet Take 1 tablet (5 mg total) by mouth daily. 09/07/16   Zara Council A, PA-C  gabapentin (NEURONTIN) 300 MG capsule  Take 300 mg by mouth at bedtime.     [provider]  simvastatin (ZOCOR) 40 MG tablet Take 40 mg by mouth every evening.     [provider]  tamsulosin (FLOMAX) 0.4 MG CAPS capsule Take 1 capsule (0.4 mg total) by mouth daily. 09/07/16   McGowan, Larene Beach A, PA-C  traMADol (ULTRAM) 50 MG tablet Take 50 mg by mouth every 8 (eight) hours as needed.    [provider]    Allergies Patient has no known allergies.  Family History  Problem Relation Age of Onset  . Pneumonia Father   . Kidney cancer Neg Hx   . Kidney disease Neg Hx   . Prostate cancer Neg Hx     Social History Social History   Tobacco Use  . Smoking status: Former Smoker    Types: Cigarettes  . Smokeless tobacco: Current User    Types: Chew  Substance Use Topics  . Alcohol use: No  . Drug use: No    Review of Systems Constitutional: No fever/chills Eyes: No visual changes. ENT: No sore throat. Cardiovascular: Denies chest pain. Respiratory: Denies shortness of breath. Gastrointestinal: No abdominal pain.  No nausea, no vomiting.  No diarrhea.  No constipation. Genitourinary: Urinary retention Musculoskeletal: Negative for back pain. Skin: Negative for rash. Neurological: Negative for headaches, focal weakness or numbness. Psychiatric:Dementia Endocrine:Diabetes, hyperlipidemia, and hypertension.  BPH  ____________________________________________   PHYSICAL EXAM:  VITAL SIGNS: ED Triage Vitals [01/06/18 1159]  Enc Vitals Group     BP 104/63     Pulse Rate 63     Resp 16     Temp 98.1 F (36.7 C)     Temp Source Oral     SpO2 96 %     Weight 156 lb (70.8 kg)     Height      Head Circumference      Peak Flow      Pain Score      Pain Loc      Pain Edu?      Excl. in [place]?    Constitutional: Alert and oriented. Well appearing and in no acute distress. Cardiovascular: Normal rate, regular rhythm. Grossly normal heart sounds.  Good peripheral  circulation. Respiratory: Normal respiratory effort.  No retractions. Lungs CTAB. Gastrointestinal: Soft and nontender. No distention. No abdominal bruits. No CVA tenderness. Genitourinary deferred Musculoskeletal: No lower extremity tenderness nor edema.  No joint effusions. Neurologic:  Normal speech and language. No gross focal neurologic deficits are appreciated. No gait instability. Skin:  Skin is warm, dry and intact. No rash noted. Psychiatric: Mood and affect are normal. Speech and behavior are normal.  ____________________________________________   LABS (all labs ordered are listed, but only abnormal results are displayed)  Labs Reviewed  URINALYSIS, COMPLETE (UACMP) WITH MICROSCOPIC - Abnormal; Notable for the following components:      Result Value   APPearance CLOUDY (*)    Hgb urine dipstick MODERATE (*)    Protein, ur TRACE (*)    Squamous Epithelial / LPF 0-5 (*)    All other components within normal limits  URINE CULTURE   ____________________________________________  EKG   ____________________________________________  RADIOLOGY  No results found.  ____________________________________________   PROCEDURES  Procedure(s) performed: None  Procedures  Critical Care performed: No  ____________________________________________   INITIAL IMPRESSION / ASSESSMENT AND PLAN / ED COURSE  As part of my medical decision making, I reviewed the following data within the [place]    Patient does not have a urinary retention.  Urine is concentrated feel the patient does not hydrate sufficiently.  Advised to increase water intake and follow-up urology if complaints persist.      ____________________________________________   FINAL CLINICAL IMPRESSION(S) / ED DIAGNOSES  Final diagnoses:  Dysuria  Dehydration  Benign essential microscopic hematuria     ED Discharge Orders    None       Note:  This document was prepared using  Dragon voice recognition software and may include unintentional dictation errors.     Sable Feil, PA-C 01/06/18 1449    Lisa Roca, MD 01/08/18 [phone]

## 2018-01-06 NOTE — ED Notes (Signed)
Pt was able to void 220 ml on his own  Urine is concentrated  Provider aware   Family at bedside

## 2018-01-06 NOTE — ED Notes (Signed)
Attempted to pass 16 f/19ml foley cath w/o success . provider aware.

## 2018-01-07 LAB — URINE CULTURE: SPECIAL REQUESTS: NORMAL

## 2018-03-29 DIAGNOSIS — F0150 Vascular dementia without behavioral disturbance: Secondary | ICD-10-CM | POA: Diagnosis not present

## 2018-03-29 DIAGNOSIS — F329 Major depressive disorder, single episode, unspecified: Secondary | ICD-10-CM | POA: Diagnosis not present

## 2018-03-29 DIAGNOSIS — I10 Essential (primary) hypertension: Secondary | ICD-10-CM | POA: Diagnosis not present

## 2018-03-29 DIAGNOSIS — E119 Type 2 diabetes mellitus without complications: Secondary | ICD-10-CM | POA: Diagnosis not present

## 2018-03-29 DIAGNOSIS — F0280 Dementia in other diseases classified elsewhere without behavioral disturbance: Secondary | ICD-10-CM | POA: Diagnosis not present

## 2018-03-29 DIAGNOSIS — G309 Alzheimer's disease, unspecified: Secondary | ICD-10-CM | POA: Diagnosis not present

## 2018-04-08 DIAGNOSIS — F0390 Unspecified dementia without behavioral disturbance: Secondary | ICD-10-CM | POA: Diagnosis not present

## 2018-04-08 DIAGNOSIS — I10 Essential (primary) hypertension: Secondary | ICD-10-CM | POA: Diagnosis not present

## 2018-04-08 DIAGNOSIS — K219 Gastro-esophageal reflux disease without esophagitis: Secondary | ICD-10-CM | POA: Diagnosis not present

## 2018-04-08 DIAGNOSIS — E785 Hyperlipidemia, unspecified: Secondary | ICD-10-CM | POA: Diagnosis not present

## 2018-04-08 DIAGNOSIS — H547 Unspecified visual loss: Secondary | ICD-10-CM | POA: Diagnosis not present

## 2018-04-08 DIAGNOSIS — H9190 Unspecified hearing loss, unspecified ear: Secondary | ICD-10-CM | POA: Diagnosis not present

## 2018-04-08 DIAGNOSIS — N400 Enlarged prostate without lower urinary tract symptoms: Secondary | ICD-10-CM | POA: Diagnosis not present

## 2018-04-08 DIAGNOSIS — F329 Major depressive disorder, single episode, unspecified: Secondary | ICD-10-CM | POA: Diagnosis not present

## 2018-04-08 DIAGNOSIS — M1990 Unspecified osteoarthritis, unspecified site: Secondary | ICD-10-CM | POA: Diagnosis not present

## 2018-04-11 ENCOUNTER — Encounter: Payer: Self-pay | Admitting: Emergency Medicine

## 2018-04-11 ENCOUNTER — Emergency Department: Payer: Medicare HMO

## 2018-04-11 ENCOUNTER — Other Ambulatory Visit: Payer: Self-pay

## 2018-04-11 ENCOUNTER — Observation Stay
Admission: EM | Admit: 2018-04-11 | Discharge: 2018-04-15 | Disposition: A | Discharge status: Home / Self Care | Payer: Medicare HMO | Attending: Internal Medicine | Admitting: Internal Medicine

## 2018-04-11 DIAGNOSIS — I10 Essential (primary) hypertension: Secondary | ICD-10-CM | POA: Insufficient documentation

## 2018-04-11 DIAGNOSIS — K8042 Calculus of bile duct with acute cholecystitis without obstruction: Principal | ICD-10-CM | POA: Insufficient documentation

## 2018-04-11 DIAGNOSIS — R269 Unspecified abnormalities of gait and mobility: Secondary | ICD-10-CM | POA: Diagnosis not present

## 2018-04-11 DIAGNOSIS — Z8673 Personal history of transient ischemic attack (TIA), and cerebral infarction without residual deficits: Secondary | ICD-10-CM | POA: Diagnosis not present

## 2018-04-11 DIAGNOSIS — R1011 Right upper quadrant pain: Secondary | ICD-10-CM

## 2018-04-11 DIAGNOSIS — K8040 Calculus of bile duct with cholecystitis, unspecified, without obstruction: Secondary | ICD-10-CM | POA: Diagnosis not present

## 2018-04-11 DIAGNOSIS — E785 Hyperlipidemia, unspecified: Secondary | ICD-10-CM | POA: Insufficient documentation

## 2018-04-11 DIAGNOSIS — E119 Type 2 diabetes mellitus without complications: Secondary | ICD-10-CM | POA: Diagnosis not present

## 2018-04-11 DIAGNOSIS — F03918 Unspecified dementia, unspecified severity, with other behavioral disturbance: Secondary | ICD-10-CM

## 2018-04-11 DIAGNOSIS — Z79899 Other long term (current) drug therapy: Secondary | ICD-10-CM | POA: Diagnosis not present

## 2018-04-11 DIAGNOSIS — E43 Unspecified severe protein-calorie malnutrition: Secondary | ICD-10-CM | POA: Diagnosis not present

## 2018-04-11 DIAGNOSIS — Z87891 Personal history of nicotine dependence: Secondary | ICD-10-CM | POA: Diagnosis not present

## 2018-04-11 DIAGNOSIS — K810 Acute cholecystitis: Secondary | ICD-10-CM | POA: Diagnosis present

## 2018-04-11 DIAGNOSIS — K828 Other specified diseases of gallbladder: Secondary | ICD-10-CM | POA: Diagnosis not present

## 2018-04-11 DIAGNOSIS — K7689 Other specified diseases of liver: Secondary | ICD-10-CM | POA: Diagnosis not present

## 2018-04-11 DIAGNOSIS — F0150 Vascular dementia without behavioral disturbance: Secondary | ICD-10-CM | POA: Diagnosis not present

## 2018-04-11 DIAGNOSIS — N400 Enlarged prostate without lower urinary tract symptoms: Secondary | ICD-10-CM | POA: Insufficient documentation

## 2018-04-11 DIAGNOSIS — G309 Alzheimer's disease, unspecified: Secondary | ICD-10-CM | POA: Diagnosis not present

## 2018-04-11 DIAGNOSIS — I2510 Atherosclerotic heart disease of native coronary artery without angina pectoris: Secondary | ICD-10-CM | POA: Insufficient documentation

## 2018-04-11 DIAGNOSIS — Z7982 Long term (current) use of aspirin: Secondary | ICD-10-CM | POA: Insufficient documentation

## 2018-04-11 DIAGNOSIS — R748 Abnormal levels of other serum enzymes: Secondary | ICD-10-CM

## 2018-04-11 DIAGNOSIS — F0280 Dementia in other diseases classified elsewhere without behavioral disturbance: Secondary | ICD-10-CM | POA: Diagnosis not present

## 2018-04-11 DIAGNOSIS — F0391 Unspecified dementia with behavioral disturbance: Secondary | ICD-10-CM | POA: Insufficient documentation

## 2018-04-11 DIAGNOSIS — R531 Weakness: Secondary | ICD-10-CM | POA: Diagnosis not present

## 2018-04-11 LAB — CBC WITH DIFFERENTIAL/PLATELET
BASOS ABS: 0 10*3/uL (ref 0–0.1)
Basophils Relative: 0 %
EOS ABS: 0 10*3/uL (ref 0–0.7)
EOS PCT: 0 %
HCT: 43.3 % (ref 40.0–52.0)
Hemoglobin: 15.1 g/dL (ref 13.0–18.0)
Lymphocytes Relative: 4 %
Lymphs Abs: 0.5 10*3/uL — ABNORMAL LOW (ref 1.0–3.6)
MCH: 31.3 pg (ref 26.0–34.0)
MCHC: 35 g/dL (ref 32.0–36.0)
MCV: 89.4 fL (ref 80.0–100.0)
MONO ABS: 0.7 10*3/uL (ref 0.2–1.0)
Monocytes Relative: 6 %
Neutro Abs: 10.9 10*3/uL — ABNORMAL HIGH (ref 1.4–6.5)
Neutrophils Relative %: 90 %
PLATELETS: 100 10*3/uL — AB (ref 150–440)
RBC: 4.84 MIL/uL (ref 4.40–5.90)
RDW: 14.7 % — ABNORMAL HIGH (ref 11.5–14.5)
WBC: 12.2 10*3/uL — AB (ref 3.8–10.6)

## 2018-04-11 LAB — COMPREHENSIVE METABOLIC PANEL
ALT: 230 U/L — AB (ref 17–63)
AST: 148 U/L — AB (ref 15–41)
Albumin: 3.9 g/dL (ref 3.5–5.0)
Alkaline Phosphatase: 184 U/L — ABNORMAL HIGH (ref 38–126)
Anion gap: 11 (ref 5–15)
BUN: 42 mg/dL — AB (ref 6–20)
CHLORIDE: 97 mmol/L — AB (ref 101–111)
CO2: 25 mmol/L (ref 22–32)
CREATININE: 1.07 mg/dL (ref 0.61–1.24)
Calcium: 8.7 mg/dL — ABNORMAL LOW (ref 8.9–10.3)
GFR calc non Af Amer: >60 mL/min (ref >60)
Glucose, Bld: 230 mg/dL — ABNORMAL HIGH (ref 65–99)
POTASSIUM: 3.5 mmol/L (ref 3.5–5.1)
Sodium: 133 mmol/L — ABNORMAL LOW (ref 135–145)
Total Bilirubin: 5.3 mg/dL — ABNORMAL HIGH (ref 0.3–1.2)
Total Protein: 7.7 g/dL (ref 6.5–8.1)

## 2018-04-11 LAB — LIPASE, BLOOD: LIPASE: 27 U/L (ref 11–51)

## 2018-04-11 MED ORDER — ONDANSETRON HCL 4 MG/2ML IJ SOLN
4.0000 mg | Freq: Once | INTRAMUSCULAR | Status: AC
  Administered 2018-04-11: 4 mg via INTRAVENOUS
  Filled 2018-04-11: qty 2

## 2018-04-11 MED ORDER — SODIUM CHLORIDE 0.9 % IV BOLUS
1000.0000 mL | Freq: Once | INTRAVENOUS | Status: AC
  Administered 2018-04-11: 1000 mL via INTRAVENOUS

## 2018-04-11 MED ORDER — PIPERACILLIN-TAZOBACTAM 3.375 G IVPB 30 MIN
3.3750 g | Freq: Once | INTRAVENOUS | Status: AC
  Administered 2018-04-11: 3.375 g via INTRAVENOUS
  Filled 2018-04-11: qty 50

## 2018-04-11 NOTE — ED Triage Notes (Addendum)
Had blood work done earlier at [facility], which was abnormal  Referred to ED for evaluation.  Son reports patient has not been eating well lately.

## 2018-04-11 NOTE — ED Provider Notes (Signed)
[facility] Emergency Department Provider Note  ____________________________________________  Time seen: Approximately 9:36 PM  I have reviewed the triage vital signs and the nursing notes.   HISTORY  Chief Complaint abnormal labs    HPI Matthew Schultz is a 84 y.o. male brought to the ED for upper abdominal pain for the past 3 days. Constant, waxing and waning, radiates to the back. Worse with eating, no alleviating factors. Moderate intensity, sharp.      Past Medical History:  Diagnosis Date  . Arthritis   . BPH (benign prostatic hyperplasia)   . CAD (coronary artery disease)   . DM (diabetes mellitus) ([place])   . HLD (hyperlipidemia)   . HTN (hypertension)   . Hydronephrosis   . Incomplete bladder emptying   . OA (osteoarthritis)   . Renal mass   . Stroke [facility])      Patient Active Problem List   Diagnosis Date Noted  . Syncope 10/19/2016  . AKI (acute kidney injury) ([place]) 10/19/2016  . Cataract 09/07/2016  . Coronary artery disease 09/07/2016  . Diabetes ([place]) 09/07/2016  . HTN (hypertension) 09/07/2016  . Hyperlipidemia 09/07/2016  . Osteoarthritis 09/07/2016  . Stroke ([place]) 09/07/2016  . DDD (degenerative disc disease), lumbar 08/23/2014  . Lumbar radiculitis 08/23/2014  . Low back pain 06/01/2014  . Lumbar radiculopathy 06/01/2014     Past Surgical History:  Procedure Laterality Date  . BACK SURGERY       Prior to Admission medications   Medication Sig Start Date End Date Taking? Authorizing Provider  finasteride (PROSCAR) 5 MG tablet Take 1 tablet (5 mg total) by mouth daily. 09/07/16   Zara Council A, PA-C  gabapentin (NEURONTIN) 300 MG capsule Take 300 mg by mouth at bedtime.     [provider]  simvastatin (ZOCOR) 40 MG tablet Take 40 mg by mouth every evening.     [provider]  tamsulosin (FLOMAX) 0.4 MG CAPS capsule Take 1 capsule (0.4 mg total) by mouth daily. 09/07/16   McGowan, Larene Beach A,  PA-C  traMADol (ULTRAM) 50 MG tablet Take 50 mg by mouth every 8 (eight) hours as needed.    [provider]     Allergies Patient has no known allergies.   Family History  Problem Relation Age of Onset  . Pneumonia Father   . Kidney cancer Neg Hx   . Kidney disease Neg Hx   . Prostate cancer Neg Hx     Social History Social History   Tobacco Use  . Smoking status: Former Smoker    Types: Cigarettes  . Smokeless tobacco: Current User    Types: Chew  Substance Use Topics  . Alcohol use: No  . Drug use: No    Review of Systems  Constitutional:   No fever or chills.  ENT:   No sore throat. No rhinorrhea. Cardiovascular:   No chest pain or syncope. Respiratory:   No dyspnea or cough. Gastrointestinal:  positive as above for abdominal pain with vomiting. Normal BMs.  Musculoskeletal:   Negative for focal pain or swelling All other systems reviewed and are negative except as documented above in ROS and HPI.  ____________________________________________   PHYSICAL EXAM:  VITAL SIGNS: ED Triage Vitals  Enc Vitals Group     BP 04/11/18 1816 (!) 145/77     Pulse Rate 04/11/18 1816 82     Resp 04/11/18 1816 16     Temp 04/11/18 1816 98 F (36.7 C)  Temp src --      SpO2 04/11/18 1816 97 %     Weight 04/11/18 1809 156 lb (70.8 kg)     Height --      Head Circumference --      Peak Flow --      Pain Score 04/11/18 1809 0     Pain Loc --      Pain Edu? --      Excl. in [place]? --     Vital signs reviewed, nursing assessments reviewed.   Constitutional:   Alert and oriented. not in distress. Eyes:   mildly icteric sclerae. EOMI. PERRL. ENT      Head:   Normocephalic and atraumatic.      Nose:   No congestion/rhinnorhea.       Mouth/Throat:   dry mucous membranes, no pharyngeal erythema. No peritonsillar mass.       Neck:   No meningismus. Full ROM. Hematological/Lymphatic/Immunilogical:   No cervical lymphadenopathy. Cardiovascular:   RRR.  Symmetric bilateral radial and DP pulses.  No murmurs.  Respiratory:   Normal respiratory effort without tachypnea/retractions. Breath sounds are clear and equal bilaterally. No wheezes/rales/rhonchi. Gastrointestinal:   Soft with  right upper quadrant tenderness. Non distended. There is no CVA tenderness.  No rebound, rigidity, or guarding.  Musculoskeletal:   Normal range of motion in all extremities. No joint effusions.  No lower extremity tenderness.  No edema. Neurologic:   Normal speech and language.  Motor grossly intact. No acute focal neurologic deficits are appreciated.  Skin:    Skin is warm, dry and intact. No rash noted.  No petechiae, purpura, or bullae.  ____________________________________________    LABS (pertinent positives/negatives) (all labs ordered are listed, but only abnormal results are displayed) Labs Reviewed  CBC WITH DIFFERENTIAL/PLATELET - Abnormal; Notable for the following components:      Result Value   WBC 12.2 (*)    RDW 14.7 (*)    Platelets 100 (*)    Neutro Abs 10.9 (*)    Lymphs Abs 0.5 (*)    All other components within normal limits  COMPREHENSIVE METABOLIC PANEL - Abnormal; Notable for the following components:   Sodium 133 (*)    Chloride 97 (*)    Glucose, Bld 230 (*)    BUN 42 (*)    Calcium 8.7 (*)    AST 148 (*)    ALT 230 (*)    Alkaline Phosphatase 184 (*)    Total Bilirubin 5.3 (*)    All other components within normal limits  LIPASE, BLOOD   ____________________________________________   EKG    ____________________________________________    RADIOLOGY  US Abdomen Limited Ruq  Result Date: 04/11/2018 CLINICAL DATA:  Right upper quadrant pain for 3 days EXAM: ULTRASOUND ABDOMEN LIMITED RIGHT UPPER QUADRANT COMPARISON:  CT 06/26/2014 FINDINGS: Gallbladder: Tumefactive sludge in the gallbladder which is distended. Negative sonographic [place]. Small pericholecystic fluid. Wall thickening up to 7 mm. Common bile duct:  Diameter: Upper normal at 6 mm Liver: Increased hepatic echogenicity. No focal hepatic abnormality. Portal vein is patent on color Doppler imaging with normal direction of blood flow towards the liver. IMPRESSION: Gallbladder sludge with wall thickening and trace pericholecystic fluid. Despite negative sonographic Percell Miller, concern would be for acalculous cholecystitis. Suggest correlation with nuclear medicine hepatobiliary imaging. Common bile duct upper normal. Increased hepatic echogenicity as may be seen with steatosis Electronically Signed   By: Donavan Foil M.D.   On: 04/11/2018 21:57  ____________________________________________   PROCEDURES Procedures  ____________________________________________  DIFFERENTIAL DIAGNOSIS   cholecystitis, choledocholithiasis, pancreatitis, bowel perforation. Unlikely AAA  CLINICAL IMPRESSION / ASSESSMENT AND PLAN / ED COURSE  Pertinent labs & imaging results that were available during my care of the patient were reviewed by me and considered in my medical decision making (see chart for details).      Clinical Course as of Apr 11 2348  Mon Apr 11, 2018  2120 P/w ruq pain, tender, elevated LFTs. Will get [place] ruq. Zofran, ivf for hydration. Doubt AAA/dissection/perforation.    [PS]  2135 Lipase nl. Choledocho vs cholecystitis. F/u US.    [PS]  2236 [place] concerning for cholecystitis. Surgery in OR. Will discuss soon. Will give zosyn for now.    [PS]  2330 D/w surgery [place] - plan to admit to medicine, GI eval, surgery consult. Continue zosyn in the meantime. NPO. IVF hydration.    [PS]    Clinical Course User Index [PS] Carrie Mew, MD     ____________________________________________   FINAL CLINICAL IMPRESSION(S) / ED DIAGNOSES    Final diagnoses:  Choledocholithiasis with acute cholecystitis  Right upper quadrant abdominal pain     ED Discharge Orders    None      Portions of this note were generated with dragon  dictation software. Dictation errors may occur despite best attempts at proofreading.    Carrie Mew, MD 04/11/18 [phone]

## 2018-04-12 ENCOUNTER — Inpatient Hospital Stay: Payer: Medicare HMO

## 2018-04-12 ENCOUNTER — Other Ambulatory Visit: Payer: Self-pay

## 2018-04-12 DIAGNOSIS — K810 Acute cholecystitis: Secondary | ICD-10-CM | POA: Diagnosis present

## 2018-04-12 DIAGNOSIS — E119 Type 2 diabetes mellitus without complications: Secondary | ICD-10-CM | POA: Diagnosis not present

## 2018-04-12 DIAGNOSIS — K8042 Calculus of bile duct with acute cholecystitis without obstruction: Secondary | ICD-10-CM

## 2018-04-12 DIAGNOSIS — K838 Other specified diseases of biliary tract: Secondary | ICD-10-CM | POA: Diagnosis not present

## 2018-04-12 DIAGNOSIS — R748 Abnormal levels of other serum enzymes: Secondary | ICD-10-CM | POA: Diagnosis not present

## 2018-04-12 DIAGNOSIS — K828 Other specified diseases of gallbladder: Secondary | ICD-10-CM | POA: Diagnosis not present

## 2018-04-12 DIAGNOSIS — N400 Enlarged prostate without lower urinary tract symptoms: Secondary | ICD-10-CM | POA: Diagnosis not present

## 2018-04-12 DIAGNOSIS — K8050 Calculus of bile duct without cholangitis or cholecystitis without obstruction: Secondary | ICD-10-CM | POA: Insufficient documentation

## 2018-04-12 DIAGNOSIS — R932 Abnormal findings on diagnostic imaging of liver and biliary tract: Secondary | ICD-10-CM | POA: Diagnosis not present

## 2018-04-12 DIAGNOSIS — R1011 Right upper quadrant pain: Secondary | ICD-10-CM | POA: Diagnosis not present

## 2018-04-12 DIAGNOSIS — F0390 Unspecified dementia without behavioral disturbance: Secondary | ICD-10-CM | POA: Diagnosis not present

## 2018-04-12 LAB — CBC
HEMATOCRIT: 42.4 % (ref 40.0–52.0)
HEMOGLOBIN: 14.8 g/dL (ref 13.0–18.0)
MCH: 31.5 pg (ref 26.0–34.0)
MCHC: 35 g/dL (ref 32.0–36.0)
MCV: 90.1 fL (ref 80.0–100.0)
Platelets: 86 10*3/uL — ABNORMAL LOW (ref 150–440)
RBC: 4.71 MIL/uL (ref 4.40–5.90)
RDW: 14.5 % (ref 11.5–14.5)
WBC: 9.8 10*3/uL (ref 3.8–10.6)

## 2018-04-12 LAB — BASIC METABOLIC PANEL
ANION GAP: 8 (ref 5–15)
BUN: 35 mg/dL — ABNORMAL HIGH (ref 6–20)
CHLORIDE: 98 mmol/L — AB (ref 101–111)
CO2: 25 mmol/L (ref 22–32)
Calcium: 8.4 mg/dL — ABNORMAL LOW (ref 8.9–10.3)
Creatinine, Ser: 1.02 mg/dL (ref 0.61–1.24)
GFR calc Af Amer: >60 mL/min (ref >60)
GLUCOSE: 167 mg/dL — AB (ref 65–99)
POTASSIUM: 3.6 mmol/L (ref 3.5–5.1)
Sodium: 131 mmol/L — ABNORMAL LOW (ref 135–145)

## 2018-04-12 LAB — COMPREHENSIVE METABOLIC PANEL
ALT: 136 U/L — AB (ref 17–63)
AST: 60 U/L — ABNORMAL HIGH (ref 15–41)
Albumin: 2.8 g/dL — ABNORMAL LOW (ref 3.5–5.0)
Alkaline Phosphatase: 149 U/L — ABNORMAL HIGH (ref 38–126)
Anion gap: 7 (ref 5–15)
BILIRUBIN TOTAL: 4.1 mg/dL — AB (ref 0.3–1.2)
BUN: 30 mg/dL — ABNORMAL HIGH (ref 6–20)
CALCIUM: 7.8 mg/dL — AB (ref 8.9–10.3)
CO2: 24 mmol/L (ref 22–32)
CREATININE: 1.04 mg/dL (ref 0.61–1.24)
Chloride: 99 mmol/L — ABNORMAL LOW (ref 101–111)
GFR calc Af Amer: >60 mL/min (ref >60)
Glucose, Bld: 135 mg/dL — ABNORMAL HIGH (ref 65–99)
POTASSIUM: 3.4 mmol/L — AB (ref 3.5–5.1)
Sodium: 130 mmol/L — ABNORMAL LOW (ref 135–145)
TOTAL PROTEIN: 6.3 g/dL — AB (ref 6.5–8.1)

## 2018-04-12 LAB — GLUCOSE, CAPILLARY
GLUCOSE-CAPILLARY: 112 mg/dL — AB (ref 65–99)
GLUCOSE-CAPILLARY: 120 mg/dL — AB (ref 65–99)
Glucose-Capillary: 162 mg/dL — ABNORMAL HIGH (ref 65–99)

## 2018-04-12 LAB — HEPATIC FUNCTION PANEL
ALK PHOS: 145 U/L — AB (ref 38–126)
ALT: 168 U/L — AB (ref 17–63)
AST: 82 U/L — ABNORMAL HIGH (ref 15–41)
Albumin: 3.2 g/dL — ABNORMAL LOW (ref 3.5–5.0)
BILIRUBIN INDIRECT: 2 mg/dL — AB (ref 0.3–0.9)
Bilirubin, Direct: 2.2 mg/dL — ABNORMAL HIGH (ref 0.1–0.5)
TOTAL PROTEIN: 6.7 g/dL (ref 6.5–8.1)
Total Bilirubin: 4.2 mg/dL — ABNORMAL HIGH (ref 0.3–1.2)

## 2018-04-12 LAB — MRSA PCR SCREENING: MRSA by PCR: NEGATIVE

## 2018-04-12 MED ORDER — SIMVASTATIN 20 MG PO TABS
40.0000 mg | ORAL_TABLET | Freq: Every evening | ORAL | Status: DC
  Administered 2018-04-12 – 2018-04-14 (×3): 40 mg via ORAL
  Filled 2018-04-12 (×3): qty 2

## 2018-04-12 MED ORDER — TRAZODONE HCL 50 MG PO TABS
25.0000 mg | ORAL_TABLET | Freq: Every evening | ORAL | Status: DC | PRN
  Administered 2018-04-14 (×2): 25 mg via ORAL
  Filled 2018-04-12 (×2): qty 1

## 2018-04-12 MED ORDER — BISACODYL 5 MG PO TBEC: 5.0000 mg | DELAYED_RELEASE_TABLET | Freq: Every day | ORAL | Status: DC | PRN

## 2018-04-12 MED ORDER — ACETAMINOPHEN 325 MG PO TABS: 650.0000 mg | ORAL_TABLET | Freq: Four times a day (QID) | ORAL | Status: DC | PRN

## 2018-04-12 MED ORDER — FINASTERIDE 5 MG PO TABS
5.0000 mg | ORAL_TABLET | Freq: Every day | ORAL | Status: DC
  Administered 2018-04-13 – 2018-04-15 (×3): 5 mg via ORAL
  Filled 2018-04-12 (×3): qty 1

## 2018-04-12 MED ORDER — TRAMADOL HCL 50 MG PO TABS: 50.0000 mg | ORAL_TABLET | Freq: Three times a day (TID) | ORAL | Status: DC | PRN

## 2018-04-12 MED ORDER — TAMSULOSIN HCL 0.4 MG PO CAPS
0.4000 mg | ORAL_CAPSULE | Freq: Every day | ORAL | Status: DC
  Administered 2018-04-13 – 2018-04-15 (×3): 0.4 mg via ORAL
  Filled 2018-04-12 (×3): qty 1

## 2018-04-12 MED ORDER — ACETAMINOPHEN 650 MG RE SUPP: 650.0000 mg | Freq: Four times a day (QID) | RECTAL | Status: DC | PRN

## 2018-04-12 MED ORDER — INSULIN ASPART 100 UNIT/ML ~~LOC~~ SOLN: 0.0000 [IU] | Freq: Every day | SUBCUTANEOUS | Status: DC

## 2018-04-12 MED ORDER — SODIUM CHLORIDE 0.9 % IV SOLN
INTRAVENOUS | Status: DC
  Administered 2018-04-12 – 2018-04-14 (×3): via INTRAVENOUS

## 2018-04-12 MED ORDER — INSULIN ASPART 100 UNIT/ML ~~LOC~~ SOLN
0.0000 [IU] | Freq: Three times a day (TID) | SUBCUTANEOUS | Status: DC
  Administered 2018-04-12: 2 [IU] via SUBCUTANEOUS
  Administered 2018-04-13 – 2018-04-15 (×4): 1 [IU] via SUBCUTANEOUS
  Filled 2018-04-12 (×6): qty 1

## 2018-04-12 MED ORDER — GABAPENTIN 300 MG PO CAPS
300.0000 mg | ORAL_CAPSULE | Freq: Every day | ORAL | Status: DC
  Administered 2018-04-12 – 2018-04-14 (×3): 300 mg via ORAL
  Filled 2018-04-12 (×4): qty 1

## 2018-04-12 MED ORDER — PIPERACILLIN-TAZOBACTAM 3.375 G IVPB
3.3750 g | Freq: Three times a day (TID) | INTRAVENOUS | Status: DC
  Administered 2018-04-12 – 2018-04-15 (×10): 3.375 g via INTRAVENOUS
  Filled 2018-04-12 (×9): qty 50

## 2018-04-12 MED ORDER — HYDROCODONE-ACETAMINOPHEN 5-325 MG PO TABS: 1.0000 | ORAL_TABLET | ORAL | Status: DC | PRN

## 2018-04-12 MED ORDER — ONDANSETRON HCL 4 MG PO TABS: 4.0000 mg | ORAL_TABLET | Freq: Four times a day (QID) | ORAL | Status: DC | PRN

## 2018-04-12 MED ORDER — HEPARIN SODIUM (PORCINE) 5000 UNIT/ML IJ SOLN
5000.0000 [IU] | Freq: Three times a day (TID) | INTRAMUSCULAR | Status: DC
  Administered 2018-04-12 – 2018-04-15 (×8): 5000 [IU] via SUBCUTANEOUS
  Filled 2018-04-12 (×10): qty 1

## 2018-04-12 MED ORDER — DOCUSATE SODIUM 100 MG PO CAPS
100.0000 mg | ORAL_CAPSULE | Freq: Two times a day (BID) | ORAL | Status: DC
  Administered 2018-04-12 – 2018-04-14 (×4): 100 mg via ORAL
  Filled 2018-04-12 (×4): qty 1

## 2018-04-12 MED ORDER — ONDANSETRON HCL 4 MG/2ML IJ SOLN: 4.0000 mg | Freq: Four times a day (QID) | INTRAMUSCULAR | Status: DC | PRN

## 2018-04-12 MED ORDER — GADOBENATE DIMEGLUMINE 529 MG/ML IV SOLN
15.0000 mL | Freq: Once | INTRAVENOUS | Status: AC | PRN
  Administered 2018-04-12: 14 mL via INTRAVENOUS

## 2018-04-12 NOTE — Progress Notes (Signed)
[place] Hospital Day(s): 0.   Post op day(s):  Marland Kitchen   Interval History: Patient seen and examined, no acute events or new complaints since admission early this morning. Though confused at baseline, patient denies any abdominal pain or nausea.  Review of Systems: Limited to HPI due to patient's baseline dementia  Vital signs in last 24 hours: [min-max] current  Temp:  [97.8 F (36.6 C)-100.2 F (37.9 C)] 97.8 F (36.6 C) (04/16 1648) Pulse Rate:  [72-87] 75 (04/16 1648) Resp:  [14-18] 18 (04/16 0525) BP: (96-144)/(61-73) 96/73 (04/16 1648) SpO2:  [94 %-98 %] 98 % (04/16 1648) Weight:  [130 lb (59 kg)-151 lb 10.8 oz (68.8 kg)] 151 lb 10.8 oz (68.8 kg) (04/16 0525)     Height: 5\' 9"  (175.3 cm) Weight: 151 lb 10.8 oz (68.8 kg) BMI (Calculated): 22.39   Intake/Output this shift:  No intake/output data recorded.   Intake/Output last 2 shifts:  @IOLAST2SHIFTS @   Physical Exam:  Constitutional: alert, cooperative and no distress, speaks happily without any evidence of discomfort HENT: normocephalic without obvious abnormality  Eyes: PERRL, EOM's grossly intact and symmetric  Neuro: CN II - XII grossly intact and symmetric without deficit  Respiratory: breathing non-labored at rest  Cardiovascular: regular rate and sinus rhythm  Gastrointestinal: soft, completely non-tender with even deep palpation and non-distended Musculoskeletal: UE and LE FROM, no edema or wounds  Labs:  CBC Latest Ref Rng & Units 04/12/2018 04/11/2018 10/20/2016  WBC 3.8 - 10.6 K/uL 9.8 12.2(H) 7.3  Hemoglobin 13.0 - 18.0 g/dL 14.8 15.1 13.9  Hematocrit 40.0 - 52.0 % 42.4 43.3 39.4(L)  Platelets 150 - 440 K/uL 86(L) 100(L) 123(L)   CMP Latest Ref Rng & Units 04/12/2018 04/12/2018 04/11/2018  Glucose 65 - 99 mg/dL 135(H) 167(H) 230(H)  BUN 6 - 20 mg/dL 30(H) 35(H) 42(H)  Creatinine 0.61 - 1.24 mg/dL 1.04 1.02 1.07  Sodium 135 - 145 mmol/L 130(L) 131(L) 133(L)  Potassium 3.5 - 5.1 mmol/L 3.4(L)  3.6 3.5  Chloride 101 - 111 mmol/L 99(L) 98(L) 97(L)  CO2 22 - 32 mmol/L 24 25 25   Calcium 8.9 - 10.3 mg/dL 7.8(L) 8.4(L) 8.7(L)  Total Protein 6.5 - 8.1 g/dL 6.3(L) 6.7 7.7  Total Bilirubin 0.3 - 1.2 mg/dL 4.1(H) 4.2(H) 5.3(H)  Alkaline Phos 38 - 126 U/L 149(H) 145(H) 184(H)  AST 15 - 41 U/L 60(H) 82(H) 148(H)  ALT 17 - 63 U/L 136(H) 168(H) 230(H)   Direct Bilirubin (04/12/2018): 2.2 Indirect Bilirubin (04/12/2018): 2.0 Lipase (04/11/2018): 27  Imaging studies:  MRCP (04/12/2018) 1. Gallbladder wall thickening is a characteristic of but not  specific for cholecystitis. 2. Mild dilatation of the common bile duct without obstructing lesion. 3. Inflammation surrounding pancreatic head and within the porta hepatis. Differential would include pancreatitis versus cholecystitis. 4. Chronic hydronephrosis of malposition RIGHT kidney in the pelvis.   Assessment/Plan: (ICD-10's: E80.6) 84 y.o. male with hyperbilirubinemia, transaminasemia, normalized mild leukocytosis, mild (6 mm) gallbladder wall thickening without cholelithiasis, and 10 mm common bile duct without any evidence to suggest abdominal pain/discomfort at this time, complicated by pertinent comorbidities including advanced chronological age, advanced baseline dementia, DM, HTN, HLD, CAD, history of stroke, BPH and incomplete bladder emptying with hydroneprhosis, and osteoarthritis.   - GI recommendations appreciated, discussed with Dr. Bonna Gains   - no indication for surgical intervention or percutaneous cholecystostomy drain at this time  - if patient were to establish/develop RUQ/epigastric abdominal pain, leukocytosis, etc, would advise percutaneous cholecystostomy drain, recognizing that cholecystostomy  drain would not address any choledocholithiasis not visualized on MRCP  - medical management of comorbidities as per primary medical team  - okay with clear liquids diet, will follow for now  All of the above findings and  recommendations were discussed with GI physician and the patient's RN. Family was not available.  Thank you for the opportunity to participate in this patient's care.  -- Marilynne Drivers Rosana Hoes, MD, [place]: [facility] - Partnering for exceptional care. Office: [phone]

## 2018-04-12 NOTE — Consult Note (Signed)
Date of Consultation:  04/12/2018  Requesting Physician:  Carrie Mew, MD  Reason for Consultation:  Cholecystitis and elevated LFTs  History of Present Illness: Matthew Schultz is a 84 y.o. male who presents to the ED from [facility] for evaluation of weakness and falls.  The patient's son brought him to [facility] clinic today.  He had labs drawn which showed a total bilirubin of 5.0, AST 139, ALT 233, and Alk phos 169.  Per the patient's son, he appeared more confused and had had two recent falls.  The patient is in the ED now, unaccompanied, with a history of dementia.  Unable to ask patient questions and review of systems.  He reports he thinks he has pain in his stomach, but I cannot get pertinent details.  Past Medical History: Past Medical History:  Diagnosis Date  . Arthritis   . BPH (benign prostatic hyperplasia)   . CAD (coronary artery disease)   . DM (diabetes mellitus) ([place])   . HLD (hyperlipidemia)   . HTN (hypertension)   . Hydronephrosis   . Incomplete bladder emptying   . OA (osteoarthritis)   . Renal mass   . Stroke [facility])      Past Surgical History: Past Surgical History:  Procedure Laterality Date  . BACK SURGERY      Home Medications: Prior to Admission medications   Medication Sig Start Date End Date Taking? Authorizing Provider  finasteride (PROSCAR) 5 MG tablet Take 1 tablet (5 mg total) by mouth daily. 09/07/16   Zara Council A, PA-C  gabapentin (NEURONTIN) 300 MG capsule Take 300 mg by mouth at bedtime.     [provider]  simvastatin (ZOCOR) 40 MG tablet Take 40 mg by mouth every evening.     [provider]  tamsulosin (FLOMAX) 0.4 MG CAPS capsule Take 1 capsule (0.4 mg total) by mouth daily. 09/07/16   McGowan, Larene Beach A, PA-C  traMADol (ULTRAM) 50 MG tablet Take 50 mg by mouth every 8 (eight) hours as needed.    [provider]    Allergies: No Known Allergies  Social History:  reports that he has  quit smoking. His smoking use included cigarettes. His smokeless tobacco use includes chew. He reports that he does not drink alcohol or use drugs.   Family History: Family History  Problem Relation Age of Onset  . Pneumonia Father   . Kidney cancer Neg Hx   . Kidney disease Neg Hx   . Prostate cancer Neg Hx     Review of Systems: Review of Systems  Unable to perform ROS: Dementia    Physical Exam BP 114/67 (BP Location: Right Arm)   Pulse 87   Temp 98 F (36.7 C)   Resp 18   Ht '5\' 9"'$  (1.753 m)   Wt 130 lb (59 kg)   SpO2 96%   BMI 19.20 kg/m  CONSTITUTIONAL: No acute distress HEENT:  Normocephalic, atraumatic, extraocular motion intact. NECK: Trachea is midline, and there is no jugular venous distension.  RESPIRATORY:  Lungs are clear, and breath sounds are equal bilaterally. Normal respiratory effort without pathologic use of accessory muscles. CARDIOVASCULAR: Heart is regular without murmurs, gallops, or rubs. GI: The abdomen is soft, nondistended, does not appear to be tender to palpation. MUSCULOSKELETAL:  Normal muscle strength and tone in all four extremities.  No peripheral edema or cyanosis. SKIN: Skin turgor is normal. There are no pathologic skin lesions.  NEUROLOGIC:  Motor and sensation is grossly normal.  Cranial nerves are grossly intact. PSYCH:  Unable to obtain  Laboratory Analysis: Results for orders placed or performed during the hospital encounter of 04/11/18 (from the past 24 hour(s))  CBC with Differential     Status: Abnormal   Collection Time: 04/11/18  6:17 PM  Result Value Ref Range   WBC 12.2 (H) 3.8 - 10.6 K/uL   RBC 4.84 4.40 - 5.90 MIL/uL   Hemoglobin 15.1 13.0 - 18.0 g/dL   HCT 43.3 40.0 - 52.0 %   MCV 89.4 80.0 - 100.0 fL   MCH 31.3 26.0 - 34.0 pg   MCHC 35.0 32.0 - 36.0 g/dL   RDW 14.7 (H) 11.5 - 14.5 %   Platelets 100 (L) 150 - 440 K/uL   Neutrophils Relative % 90 %   Neutro Abs 10.9 (H) 1.4 - 6.5 K/uL   Lymphocytes Relative 4 %    Lymphs Abs 0.5 (L) 1.0 - 3.6 K/uL   Monocytes Relative 6 %   Monocytes Absolute 0.7 0.2 - 1.0 K/uL   Eosinophils Relative 0 %   Eosinophils Absolute 0.0 0 - 0.7 K/uL   Basophils Relative 0 %   Basophils Absolute 0.0 0 - 0.1 K/uL  Comprehensive metabolic panel     Status: Abnormal   Collection Time: 04/11/18  6:17 PM  Result Value Ref Range   Sodium 133 (L) 135 - 145 mmol/L   Potassium 3.5 3.5 - 5.1 mmol/L   Chloride 97 (L) 101 - 111 mmol/L   CO2 25 22 - 32 mmol/L   Glucose, Bld 230 (H) 65 - 99 mg/dL   BUN 42 (H) 6 - 20 mg/dL   Creatinine, Ser 1.07 0.61 - 1.24 mg/dL   Calcium 8.7 (L) 8.9 - 10.3 mg/dL   Total Protein 7.7 6.5 - 8.1 g/dL   Albumin 3.9 3.5 - 5.0 g/dL   AST 148 (H) 15 - 41 U/L   ALT 230 (H) 17 - 63 U/L   Alkaline Phosphatase 184 (H) 38 - 126 U/L   Total Bilirubin 5.3 (H) 0.3 - 1.2 mg/dL   GFR calc non Af Amer >60 >60 mL/min   GFR calc Af Amer >60 >60 mL/min   Anion gap 11 5 - 15  Lipase, blood     Status: None   Collection Time: 04/11/18  6:17 PM  Result Value Ref Range   Lipase 27 11 - 51 U/L    Imaging: US Abdomen Limited Ruq  Result Date: 04/11/2018 CLINICAL DATA:  Right upper quadrant pain for 3 days EXAM: ULTRASOUND ABDOMEN LIMITED RIGHT UPPER QUADRANT COMPARISON:  CT 06/26/2014 FINDINGS: Gallbladder: Tumefactive sludge in the gallbladder which is distended. Negative sonographic Murphy. Small pericholecystic fluid. Wall thickening up to 7 mm. Common bile duct: Diameter: Upper normal at 6 mm Liver: Increased hepatic echogenicity. No focal hepatic abnormality. Portal vein is patent on color Doppler imaging with normal direction of blood flow towards the liver. IMPRESSION: Gallbladder sludge with wall thickening and trace pericholecystic fluid. Despite negative sonographic Percell Miller, concern would be for acalculous cholecystitis. Suggest correlation with nuclear medicine hepatobiliary imaging. Common bile duct upper normal. Increased hepatic echogenicity as may be  seen with steatosis Electronically Signed   By: Donavan Foil M.D.   On: 04/11/2018 21:57    Assessment and Plan: This is a 84 y.o. male who presents with possible cholecystitis and choledocholithiasis based on elevated LFTs.  I have independently viewed the patient's imaging study and reviewed his laboratory studies.  Overall, his total bilirubin  has been 5.0 and 5.3 on two checks today.  His ultrasound does have some wall thickening and trace fluid with sludge.  Patient is being admitted to the medical team.  Agree with GI consult for evaluation of elevated LFTs.  He may need ERCP.  Would recommend labs tomorrow to trend.  Agree with IV antibiotics given elevated WBC as well.  Keep NPO with IV fluid hydration.  At this point, I cannot have a good conversation with the patient to really elucidate what symptoms he may be having and whether cholecystectomy in the future would be recommended.  Will continue to follow with you.  Face-to-face time spent with the patient and care providers was 80 minutes, with more than 50% of the time spent counseling, educating, and coordinating care of the patient.     Melvyn Neth, [place]

## 2018-04-12 NOTE — Progress Notes (Signed)
Pharmacy Antibiotic Note  Matthew Schultz is a 84 y.o. male admitted on 04/11/2018 with intra-abdominal infection.  Pharmacy has been consulted for zosyn dosing.  Plan: Zosyn 3.375g IV q8h (4 hour infusion).  Height: 5\' 9"  (175.3 cm) Weight: 130 lb (59 kg) IBW/kg (Calculated) : 70.7  Temp (24hrs), Avg:98 F (36.7 C), Min:98 F (36.7 C), Max:98 F (36.7 C)  Recent Labs  Lab 04/11/18 1817  WBC 12.2*  CREATININE 1.07    Estimated Creatinine Clearance: 43.7 mL/min (by C-G formula based on SCr of 1.07 mg/dL).    No Known Allergies  Thank you for allowing pharmacy to be a part of this patient's care.  Tobie Lords, PharmD, BCPS Clinical Pharmacist 04/12/2018

## 2018-04-12 NOTE — Progress Notes (Signed)
Admitted for acute cholecystitis, patient is going to MRCP because of elevated bilirubin and evaluation of choledocholithiasis.  Continue n.p.o., IV fluids, monitor liver function trend, continue empiric antibiotics.,  SSI with coverage.  Discussed with patient's daughter/  Spent 20 minutes.

## 2018-04-12 NOTE — Progress Notes (Signed)
Dr. Rosana Hoes and I discussed the patient. His MRCP does not show choledocholithiasis. CBD in 10 mm.  Inflammation in the porta hepatis is reported from pancreatitis vs cholecystitis. Gallbladder wall thickening is also mentioned.  Elevation in his liver enzymes is acute and not chronic. They were normal on April 2nd in Care everywhere.   The only new medication I see that was started recently is donepezil which has rare adverse effect (>1% as per up to date) of elevating Alk Phos.   Recommendation: Would get HIDA scan as the porta hepatis inflammation mentioned could be from pancreatitis vs cholecystitis.   But Lipase is normal, and Gallbladder wall thickening and pericholecystic fluid is reported, so which would go along with cholecystitis. HIDA would help confirm or rule it out.  Will check viral hepatitis and autoimmune labs with CMP in the AM  Avoid Hepatotoxic drugs Hold Simvastatin until above workup is complete

## 2018-04-12 NOTE — Consult Note (Signed)
Vonda Antigua, MD [address], [place], [place], [place], [address] [address], [place], [place], [place], [address] Phone: [phone]  Fax: [phone]  Consultation  Referring Provider:     Dr. Vianne Bulls Primary Care Physician:  Maryland Pink, MD Reason for Consultation: Elevated liver enzymes     Date of Admission:  04/11/2018 Date of Consultation:  04/12/2018         HPI:   Matthew Schultz is a 84 y.o. male resting in bed comfortably.  He is a poor historian, has history of dementia.  Does not answer questions well.  History obtained partially from patient, and rest from patient's chart.  Patient brought to the hospital due to abdominal pain, by the family.  Abdominal pain ongoing for the past 2-3 days.  Family noted generalized weakness and confusion and falls in the past 2-3 days.  No fever or chills, nausea vomiting, or diarrhea noted.  Patient himself, today denies abdominal pain.  Blood work showed elevated liver enzymes, total bilirubin of 4.2, alk phos of 145, AST of 82, ALT of 168.  Ultrasound showed gallbladder sludge with wall thickening and trace pericholecystic fluid.  Common bile duct 6 mm.  Past Medical History:  Diagnosis Date  . Arthritis   . BPH (benign prostatic hyperplasia)   . CAD (coronary artery disease)   . DM (diabetes mellitus) ([place])   . HLD (hyperlipidemia)   . HTN (hypertension)   . Hydronephrosis   . Incomplete bladder emptying   . OA (osteoarthritis)   . Renal mass   . Stroke [facility])     Past Surgical History:  Procedure Laterality Date  . BACK SURGERY      Prior to Admission medications   Medication Sig Start Date End Date Taking? Authorizing Provider  aspirin EC 81 MG tablet Take 81 mg by mouth daily.   Yes [provider]  donepezil (ARICEPT) 5 MG tablet Take 1 tablet by mouth daily. 04/06/18  Yes [provider]  gabapentin (NEURONTIN) 300 MG capsule Take 300 mg by mouth at bedtime.    Yes [provider]  metoprolol tartrate (LOPRESSOR) 25 MG tablet Take 1 tablet by mouth 2 (two) times daily. 03/28/18  Yes [provider]  omeprazole (PRILOSEC) 20 MG capsule Take 20 mg by mouth 2 (two) times daily.   Yes [provider]  simvastatin (ZOCOR) 40 MG tablet Take 40 mg by mouth every evening.    Yes [provider]  tamsulosin (FLOMAX) 0.4 MG CAPS capsule Take 1 capsule (0.4 mg total) by mouth daily. 09/07/16  Yes McGowan, Larene Beach A, PA-C  finasteride (PROSCAR) 5 MG tablet Take 1 tablet (5 mg total) by mouth daily. Patient not taking: Reported on 04/12/2018 09/07/16   Zara Council A, PA-C  traMADol (ULTRAM) 50 MG tablet Take 50 mg by mouth every 8 (eight) hours as needed.    [provider]    Family History  Problem Relation Age of Onset  . Pneumonia Father   . Kidney cancer Neg Hx   . Kidney disease Neg Hx   . Prostate cancer Neg Hx      Social History   Tobacco Use  . Smoking status: Former Smoker    Types: Cigarettes  . Smokeless tobacco: Current User    Types: Chew  Substance Use Topics  . Alcohol use: No  . Drug use: No    Allergies as of 04/11/2018  . (No Known Allergies)  Review of Systems:    All systems reviewed and negative except where noted in HPI.   Physical Exam:  Vital signs in last 24 hours: Vitals:   04/12/18 0430 04/12/18 0446 04/12/18 0525 04/12/18 0720  BP: 119/66 119/66 129/69 109/64  Pulse:  75 86 85  Resp:  14 18   Temp:   100.2 F (37.9 C) 98.5 F (36.9 C)  TempSrc:   Oral Oral  SpO2:  95% 95% 97%  Weight:   151 lb 10.8 oz (68.8 kg)   Height:   _0  (1.753 m)    Last BM Date: 04/12/18 General:   Pleasant, cooperative in NAD Head:  Normocephalic and atraumatic. Eyes:   No icterus.   Conjunctiva pink. PERRLA. Ears:  Normal auditory acuity. Neck:  Supple; no masses or thyroidomegaly Lungs: Respirations even and unlabored. Lungs clear to auscultation bilaterally.   No wheezes, crackles, or  rhonchi.  Abdomen:  Soft, nondistended, tender to palpation right upper quadrant, midepigastric. Normal bowel sounds. No appreciable masses or hepatomegaly.  No rebound or guarding.  Neurologic:  Alert and oriented x3;  grossly normal neurologically. Skin:  Intact without significant lesions or rashes. Cervical Nodes:  No significant cervical adenopathy. Psych:  Alert and cooperative. Normal affect.  LAB RESULTS: Recent Labs    04/11/18 1817 04/12/18 0526  WBC 12.2* 9.8  HGB 15.1 14.8  HCT 43.3 42.4  PLT 100* 86*   BMET Recent Labs    04/11/18 1817 04/12/18 0526  NA 133* 131*  K 3.5 3.6  CL 97* 98*  CO2 25 25  GLUCOSE 230* 167*  BUN 42* 35*  CREATININE 1.07 1.02  CALCIUM 8.7* 8.4*   LFT Recent Labs    04/12/18 0526  PROT 6.7  ALBUMIN 3.2*  AST 82*  ALT 168*  ALKPHOS 145*  BILITOT 4.2*  BILIDIR 2.2*  IBILI 2.0*   PT/INR No results for input(s): LABPROT, INR in the last 72 hours.  STUDIES: US Abdomen Limited Ruq  Result Date: 04/11/2018 CLINICAL DATA:  Right upper quadrant pain for 3 days EXAM: ULTRASOUND ABDOMEN LIMITED RIGHT UPPER QUADRANT COMPARISON:  CT 06/26/2014 FINDINGS: Gallbladder: Tumefactive sludge in the gallbladder which is distended. Negative sonographic Murphy. Small pericholecystic fluid. Wall thickening up to 7 mm. Common bile duct: Diameter: Upper normal at 6 mm Liver: Increased hepatic echogenicity. No focal hepatic abnormality. Portal vein is patent on color Doppler imaging with normal direction of blood flow towards the liver. IMPRESSION: Gallbladder sludge with wall thickening and trace pericholecystic fluid. Despite negative sonographic Percell [place], concern would be for acalculous cholecystitis. Suggest correlation with nuclear medicine hepatobiliary imaging. Common bile duct upper normal. Increased hepatic echogenicity as may be seen with steatosis Electronically Signed   By: Donavan Foil M.D.   On: 04/11/2018 21:57      Impression / Plan:    Matthew Schultz is a 84 y.o. y/o male with dementia, admitted with abdominal pain, weakness at home, found to have pericholecystic fluid, sludge in the gallbladder and distention, and elevated liver enzymes  Ultrasound consistent with cholecystitis Surgery following the patient and considering cholecystectomy Due to elevated bilirubin, will obtain MRCP to evaluate for choledocholithiasis Continue daily CMP Avoid hepatotoxic drugs No evidence of pancreatitis  Thank you for involving me in the care of this patient.      LOS: 0 days   Virgel Manifold, MD  04/12/2018, 12:39 PM

## 2018-04-12 NOTE — H&P (Signed)
[facility] [place] NAME: Matthew Schultz    MR#:  [id number]  DATE OF BIRTH:  Mar 24, 1932  DATE OF ADMISSION:  04/11/2018  PRIMARY CARE PHYSICIAN: Maryland Pink, MD   REQUESTING/REFERRING PHYSICIAN:   CHIEF COMPLAINT:   Chief Complaint  Patient presents with  . abnormal labs    HISTORY OF PRESENT ILLNESS: Matthew Schultz  is a 84 y.o. male with a known history of dementia, osteoarthritis, BPH, CAD, diabetes type 2, diet controlled, hypertension and other comorbidities. Patient is unable to provide history, due to confusion secondary to dementia.  Most of the information was taken from reviewing the medical records and from discussion with emergency room physician.  Patient was brought to the hospital by the family for intermittent abdominal pain, going on for the past 2-3 days.  Per family, patient has been noted with worsening generalized weakness, confusion and falls in the past 2-3 days.  No fever, chills, nausea, vomiting, diarrhea noted. Blood test done in emergency room showed elevated WBC at 12.2 liver function tests are elevated.  AST is 148 ALT is 230.  Total bilirubin level is elevated at 5.3. Right upper quadrant ultrasound, reviewed by myself, shows gallbladder sludge with wall thickening and trace pericholecystic fluid, concerning for cholecystitis. Vernard Gambles is admitted for further evaluation and treatment.  PAST MEDICAL HISTORY:   Past Medical History:  Diagnosis Date  . Arthritis   . BPH (benign prostatic hyperplasia)   . CAD (coronary artery disease)   . DM (diabetes mellitus) ([place])   . HLD (hyperlipidemia)   . HTN (hypertension)   . Hydronephrosis   . Incomplete bladder emptying   . OA (osteoarthritis)   . Renal mass   . Stroke [facility])     PAST SURGICAL HISTORY:  Past Surgical History:  Procedure Laterality Date  . BACK SURGERY      SOCIAL HISTORY:  Social History   Tobacco Use  . Smoking status: Former  Smoker    Types: Cigarettes  . Smokeless tobacco: Current User    Types: Chew  Substance Use Topics  . Alcohol use: No    FAMILY HISTORY:  Family History  Problem Relation Age of Onset  . Pneumonia Father   . Kidney cancer Neg Hx   . Kidney disease Neg Hx   . Prostate cancer Neg Hx     DRUG ALLERGIES: No Known Allergies  REVIEW OF SYSTEMS:   Unable to obtain due to patient's confusion.  MEDICATIONS AT HOME:  Prior to Admission medications   Medication Sig Start Date End Date Taking? Authorizing Provider  finasteride (PROSCAR) 5 MG tablet Take 1 tablet (5 mg total) by mouth daily. 09/07/16   Zara Council A, PA-C  gabapentin (NEURONTIN) 300 MG capsule Take 300 mg by mouth at bedtime.     [provider]  simvastatin (ZOCOR) 40 MG tablet Take 40 mg by mouth every evening.     [provider]  tamsulosin (FLOMAX) 0.4 MG CAPS capsule Take 1 capsule (0.4 mg total) by mouth daily. 09/07/16   McGowan, Larene Beach A, PA-C  traMADol (ULTRAM) 50 MG tablet Take 50 mg by mouth every 8 (eight) hours as needed.    [provider]      PHYSICAL EXAMINATION:   VITAL SIGNS: Blood pressure 114/67, pulse 87, temperature 98 F (36.7 C), resp. rate 18, height 5\' 9"  (1.753 m), weight 59 kg (130 lb), SpO2 96 %.  GENERAL:  84 y.o.-year-old patient lying  in the bed, confused but, with no acute distress.  EYES: Pupils equal, round, reactive to light and accommodation. No scleral icterus. HEENT: Head atraumatic, normocephalic. Oropharynx and nasopharynx clear.  NECK:  Supple, no jugular venous distention. No thyroid enlargement, no tenderness.  LUNGS: Normal breath sounds bilaterally, no wheezing, rales,rhonchi or crepitation. No use of accessory muscles of respiration.  CARDIOVASCULAR: S1, S2 normal. No murmurs, rubs, or gallops.  ABDOMEN: Soft, nondistended, currently nontender, status post pain medication.  Bowel sounds present. No organomegaly or mass.  EXTREMITIES: No  pedal edema, cyanosis, or clubbing.  NEUROLOGIC EXAM is limited due to patient's confusion.  He is moving all his extremities, focal weakness appreciated. PSYCHIATRIC: The patient is alert, but disoriented. SKIN: No obvious rash, lesion, or ulcer.   LABORATORY PANEL:   CBC Recent Labs  Lab 04/11/18 1817  WBC 12.2*  HGB 15.1  HCT 43.3  PLT 100*  MCV 89.4  MCH 31.3  MCHC 35.0  RDW 14.7*  LYMPHSABS 0.5*  MONOABS 0.7  EOSABS 0.0  BASOSABS 0.0   ------------------------------------------------------------------------------------------------------------------  Chemistries  Recent Labs  Lab 04/11/18 1817  NA 133*  K 3.5  CL 97*  CO2 25  GLUCOSE 230*  BUN 42*  CREATININE 1.07  CALCIUM 8.7*  AST 148*  ALT 230*  ALKPHOS 184*  BILITOT 5.3*   ------------------------------------------------------------------------------------------------------------------ estimated creatinine clearance is 43.7 mL/min (by C-G formula based on SCr of 1.07 mg/dL). ------------------------------------------------------------------------------------------------------------------ No results for input(s): TSH, T4TOTAL, T3FREE, THYROIDAB in the last 72 hours.  Invalid input(s): FREET3   Coagulation profile No results for input(s): INR, PROTIME in the last 168 hours. ------------------------------------------------------------------------------------------------------------------- No results for input(s): DDIMER in the last 72 hours. -------------------------------------------------------------------------------------------------------------------  Cardiac Enzymes No results for input(s): CKMB, TROPONINI, MYOGLOBIN in the last 168 hours.  Invalid input(s): CK ------------------------------------------------------------------------------------------------------------------ Invalid input(s):  POCBNP  ---------------------------------------------------------------------------------------------------------------  Urinalysis    Component Value Date/Time   COLORURINE YELLOW 01/06/2018 1314   APPEARANCEUR CLOUDY (A) 01/06/2018 1314   APPEARANCEUR Clear 11/28/2013 1712   LABSPEC 1.025 01/06/2018 1314   LABSPEC 1.019 11/28/2013 1712   PHURINE 7.0 01/06/2018 1314   GLUCOSEU NEGATIVE 01/06/2018 1314   GLUCOSEU Negative 11/28/2013 1712   HGBUR MODERATE (A) 01/06/2018 1314   BILIRUBINUR NEGATIVE 01/06/2018 1314   BILIRUBINUR Negative 11/28/2013 1712   KETONESUR NEGATIVE 01/06/2018 1314   PROTEINUR TRACE (A) 01/06/2018 1314   NITRITE NEGATIVE 01/06/2018 1314   LEUKOCYTESUR NEGATIVE 01/06/2018 1314   LEUKOCYTESUR Negative 11/28/2013 1712     RADIOLOGY: US Abdomen Limited Ruq  Result Date: 04/11/2018 CLINICAL DATA:  Right upper quadrant pain for 3 days EXAM: ULTRASOUND ABDOMEN LIMITED RIGHT UPPER QUADRANT COMPARISON:  CT 06/26/2014 FINDINGS: Gallbladder: Tumefactive sludge in the gallbladder which is distended. Negative sonographic Murphy. Small pericholecystic fluid. Wall thickening up to 7 mm. Common bile duct: Diameter: Upper normal at 6 mm Liver: Increased hepatic echogenicity. No focal hepatic abnormality. Portal vein is patent on color Doppler imaging with normal direction of blood flow towards the liver. IMPRESSION: Gallbladder sludge with wall thickening and trace pericholecystic fluid. Despite negative sonographic Percell Miller, concern would be for acalculous cholecystitis. Suggest correlation with nuclear medicine hepatobiliary imaging. Common bile duct upper normal. Increased hepatic echogenicity as may be seen with steatosis Electronically Signed   By: Donavan Foil M.D.   On: 04/11/2018 21:57    EKG: Orders placed or performed during the hospital encounter of 10/19/16  . EKG 12-Lead  . EKG 12-Lead  . ED EKG  . ED EKG  .  EKG    IMPRESSION AND PLAN:  1.  Acute  cholecystitis, per abdominal ultrasound.  We will start patient on IV antibiotics, Zosyn.  Will keep patient n.p.o.  Gastroenterology and general surgery are consulted for further evaluation and treatment. 2.  Diabetes type 2, diet controlled.  Will monitor blood sugars before meals and at bedtime and use insulin sliding scale during the hospital stay.  3.  BPH, stable, continue home meds. 4.  Advanced dementia, continue to monitor clinically closely. 5.  Generalized weakness and deconditioning.  We will consult PT/OT for evaluation and treatment.  All the records are reviewed and case discussed with ED provider.   CODE STATUS: Code Status History    Date Active Date Inactive Code Status Order ID Comments User Context   10/19/2016 2221 10/20/2016 1635 Full Code [id number]  Lance Coon, MD Inpatient       TOTAL TIME TAKING CARE OF THIS PATIENT: 45 minutes.    Amelia Jo M.D on 04/12/2018 at 4:36 AM  Between 7am to 6pm - Pager - [phone]  After 6pm go to [email or web address] - password [email or web address] [facility]  [place] [facility]  Office  [phone]  CC: Primary care physician; Maryland Pink, MD

## 2018-04-12 NOTE — Progress Notes (Addendum)
Family Meeting Note  Advance Directive: yes  Today a meeting took place; spoke to multiple family members including 2 daughters, 2 sons, patient's wfe   The following clinical team members were present during this meeting: Multiple family members, MD  Patient daughters, son, wife wanted him to be full code at this time.  We will follow and make further recommendations. Prognosis guarded.   the following were discussed:Patient's diagnosis: ,, they wanted him to be full code.   Time spent 15 minutes on ACP planning Signed by Dr.Braheem Tomasik

## 2018-04-12 NOTE — Progress Notes (Signed)
Chaplain was referred by nurse to educate Pt and family about AD. Pt was not able to pass screening questions. Ch educated family and answered questions. Family said they make decisions together and wife agreed. Chaplain prayed for the family and Pt.    04/12/18 1300  Clinical Encounter Type  Visited With Patient and family together  Visit Type Spiritual support  Referral From Nurse  Spiritual Encounters  Spiritual Needs Brochure;Prayer

## 2018-04-13 DIAGNOSIS — E119 Type 2 diabetes mellitus without complications: Secondary | ICD-10-CM | POA: Diagnosis not present

## 2018-04-13 DIAGNOSIS — N400 Enlarged prostate without lower urinary tract symptoms: Secondary | ICD-10-CM | POA: Diagnosis not present

## 2018-04-13 DIAGNOSIS — F0390 Unspecified dementia without behavioral disturbance: Secondary | ICD-10-CM | POA: Diagnosis not present

## 2018-04-13 DIAGNOSIS — K810 Acute cholecystitis: Secondary | ICD-10-CM | POA: Diagnosis not present

## 2018-04-13 LAB — COMPREHENSIVE METABOLIC PANEL
ALBUMIN: 2.6 g/dL — AB (ref 3.5–5.0)
ALK PHOS: 157 U/L — AB (ref 38–126)
ALT: 97 U/L — AB (ref 17–63)
AST: 42 U/L — AB (ref 15–41)
Anion gap: 6 (ref 5–15)
BILIRUBIN TOTAL: 3.1 mg/dL — AB (ref 0.3–1.2)
BUN: 27 mg/dL — AB (ref 6–20)
CO2: 24 mmol/L (ref 22–32)
CREATININE: 0.75 mg/dL (ref 0.61–1.24)
Calcium: 7.9 mg/dL — ABNORMAL LOW (ref 8.9–10.3)
Chloride: 105 mmol/L (ref 101–111)
GFR calc Af Amer: >60 mL/min (ref >60)
GFR calc non Af Amer: >60 mL/min (ref >60)
GLUCOSE: 109 mg/dL — AB (ref 65–99)
Potassium: 3.3 mmol/L — ABNORMAL LOW (ref 3.5–5.1)
Sodium: 135 mmol/L (ref 135–145)
TOTAL PROTEIN: 5.7 g/dL — AB (ref 6.5–8.1)

## 2018-04-13 LAB — GLUCOSE, CAPILLARY
GLUCOSE-CAPILLARY: 131 mg/dL — AB (ref 65–99)
Glucose-Capillary: 106 mg/dL — ABNORMAL HIGH (ref 65–99)
Glucose-Capillary: 135 mg/dL — ABNORMAL HIGH (ref 65–99)
Glucose-Capillary: 148 mg/dL — ABNORMAL HIGH (ref 65–99)

## 2018-04-13 LAB — BILIRUBIN, DIRECT: Bilirubin, Direct: 1.6 mg/dL — ABNORMAL HIGH (ref 0.1–0.5)

## 2018-04-13 MED ORDER — METOPROLOL TARTRATE 25 MG PO TABS
12.5000 mg | ORAL_TABLET | Freq: Two times a day (BID) | ORAL | Status: DC
  Administered 2018-04-13 – 2018-04-14 (×4): 12.5 mg via ORAL
  Filled 2018-04-13 (×4): qty 1

## 2018-04-13 MED ORDER — POTASSIUM CHLORIDE 20 MEQ PO PACK
20.0000 meq | PACK | Freq: Once | ORAL | Status: AC
  Administered 2018-04-13: 20 meq via ORAL
  Filled 2018-04-13: qty 1

## 2018-04-13 NOTE — Care Management CC44 (Signed)
Condition Code 44 Documentation Completed  Patient Details  Name: KENNA KIRN MRN: [id number] Date of Birth: [birth year]   Condition Code 44 given:  Yes Patient signature on Condition Code 44 notice:  Yes Documentation of 2 MD's agreement:  Yes Code 44 added to claim:  Yes    Jolly Mango, RN 04/13/2018, 4:16 PM

## 2018-04-13 NOTE — Progress Notes (Signed)
[facility] [place] NAME: Matthew Schultz    MR#:  [id number]  DATE OF BIRTH:  [birth year]  SUBJECTIVE: Patient admitted for evaluation of jaundice, elevated LFTs with nausea, abdominal pain.  MRCP is essentially normal.  His LFTs are improving.  Patient has gallstones, HIDA scan recommended by gastroenterology so I ordered that.  He says he feels better than yesterday, no nausea, no abdominal pain.  Started on clear liquids.  BP soft side and has tachycardia.  But patient appears comfortable.  CHIEF COMPLAINT:   Chief Complaint  Patient presents with  . abnormal labs    REVIEW OF SYSTEMS:    Review of Systems  Constitutional: Negative for chills and fever.  HENT: Negative for hearing loss.   Eyes: Negative for blurred vision, double vision and photophobia.  Respiratory: Negative for cough, hemoptysis and shortness of breath.   Cardiovascular: Negative for palpitations, orthopnea and leg swelling.  Gastrointestinal: Positive for heartburn. Negative for abdominal pain, diarrhea and vomiting.  Genitourinary: Negative for dysuria and urgency.  Musculoskeletal: Negative for myalgias and neck pain.  Skin: Negative for rash.  Neurological: Negative for dizziness, focal weakness, seizures, weakness and headaches.  Psychiatric/Behavioral: Negative for memory loss. The patient does not have insomnia.     Nutrition: Tolerating Diet: Tolerating PT:      DRUG ALLERGIES:  No Known Allergies  VITALS:  Blood pressure 96/68, pulse (!) 136, temperature 98.2 F (36.8 C), temperature source Oral, resp. rate 20, height '5\' 9"'$  (1.753 m), weight 68.9 kg (152 lb), SpO2 95 %.  PHYSICAL EXAMINATION:   Physical Exam  GENERAL:  84 y.o.-year-old patient lying in the bed with no acute distress.  EYES: Pupils equal, round, reactive to light and accommodation. No scleral icterus. Extraocular muscles intact.  HEENT: Head atraumatic, normocephalic.  Oropharynx and nasopharynx clear.  NECK:  Supple, no jugular venous distention. No thyroid enlargement, no tenderness.  LUNGS: Normal breath sounds bilaterally, no wheezing, rales,rhonchi or crepitation. No use of accessory muscles of respiration.  CARDIOVASCULAR: S1, S2 normal. No murmurs, rubs, or gallops.  ABDOMEN: Soft, nontender, nondistended. Bowel sounds present. No organomegaly or mass.  EXTREMITIES: No pedal edema, cyanosis, or clubbing.  NEUROLOGIC: Cranial nerves II through XII are intact. Muscle strength 5/5 in all extremities. Sensation intact. Gait not checked.  PSYCHIATRIC: The patient is alert and oriented x 3.  SKIN: No obvious rash, lesion, or ulcer.  Hard of hearing.  LABORATORY PANEL:   CBC Recent Labs  Lab 04/12/18 0526  WBC 9.8  HGB 14.8  HCT 42.4  PLT 86*   ------------------------------------------------------------------------------------------------------------------  Chemistries  Recent Labs  Lab 04/13/18 0420  NA 135  K 3.3*  CL 105  CO2 24  GLUCOSE 109*  BUN 27*  CREATININE 0.75  CALCIUM 7.9*  AST 42*  ALT 97*  ALKPHOS 157*  BILITOT 3.1*   ------------------------------------------------------------------------------------------------------------------  Cardiac Enzymes No results for input(s): TROPONINI in the last 168 hours. ------------------------------------------------------------------------------------------------------------------  RADIOLOGY:  Mr 3d Recon At Scanner  Result Date: 04/12/2018 CLINICAL DATA:  Intermittent abdominal pain. Pain for 2-3 days. Elevated white blood cell count elevated bilirubin. Gallbladder ultrasound concern for cholecystitis. EXAM: MRI ABDOMEN WITHOUT AND WITH CONTRAST (INCLUDING MRCP) TECHNIQUE: Multiplanar multisequence MR imaging of the abdomen was performed both before and after the administration of intravenous contrast. Heavily T2-weighted images of the biliary and pancreatic ducts were obtained,  and three-dimensional MRCP images were rendered by post processing. CONTRAST:  94m MULTIHANCE GADOBENATE  DIMEGLUMINE 529 MG/ML IV SOLN COMPARISON:  Ultrasound 04/11/2018 FINDINGS: Respiratory motion degrades the postcontrast imaging. Lower chest:  Lung bases are clear. Hepatobiliary: The gallbladder wall is thickened to 6 mm. No discrete gallstones are identified. Small amount pericholecystic fluid. There is no intrahepatic duct dilatation. The common bile duct measures 10 mm. No filling defects within the common bile duct. No focal hepatic lesions. Pancreas: There is inflammation surrounding the pancreatic head. There is inflammation in the porta hepatis with edema. No organized fluid collections. No pancreatic duct dilatation. No variant pancreatic ductal anatomy. Spleen: Normal spleen. Adrenals/urinary tract: Round lesion of the RIGHT adrenal gland measures 14 mm. This lesion has loss of signal intensity on opposed phase imaging (series 8) consistent with benign adrenal adenoma. LEFT adrenal gland normal. Several nonenhancing cysts within the LEFT kidney. The post-contrast T1 weighted images are severely degraded by patient respiratory motion. The RIGHT kidney is mal position within the upper pelvis. There is dilatation of the RIGHT renal pelvis and collecting system similar to CT 06/26/2014. Stomach/Bowel: Stomach and limited of the small bowel is unremarkable Vascular/Lymphatic: Abdominal aortic normal caliber. No retroperitoneal periportal lymphadenopathy. Musculoskeletal: No aggressive osseous lesion IMPRESSION: 1. Gallbladder wall thickening is a characteristic of but not specific for cholecystitis. Elevated white blood cell count elevates suspicion. 2. Mild dilatation of the common bile duct without obstructing lesion. 3. Inflammation surrounding pancreatic head and within the porta hepatis. Differential would include pancreatitis versus cholecystitis. 4. Chronic hydronephrosis of malposition RIGHT kidney  in the pelvis. Electronically Signed   By: Suzy Bouchard M.D.   On: 04/12/2018 16:06   Mr Abdomen Mrcp Moise Boring Contast  Result Date: 04/12/2018 CLINICAL DATA:  Intermittent abdominal pain. Pain for 2-3 days. Elevated white blood cell count elevated bilirubin. Gallbladder ultrasound concern for cholecystitis. EXAM: MRI ABDOMEN WITHOUT AND WITH CONTRAST (INCLUDING MRCP) TECHNIQUE: Multiplanar multisequence MR imaging of the abdomen was performed both before and after the administration of intravenous contrast. Heavily T2-weighted images of the biliary and pancreatic ducts were obtained, and three-dimensional MRCP images were rendered by post processing. CONTRAST:  24m MULTIHANCE GADOBENATE DIMEGLUMINE 529 MG/ML IV SOLN COMPARISON:  Ultrasound 04/11/2018 FINDINGS: Respiratory motion degrades the postcontrast imaging. Lower chest:  Lung bases are clear. Hepatobiliary: The gallbladder wall is thickened to 6 mm. No discrete gallstones are identified. Small amount pericholecystic fluid. There is no intrahepatic duct dilatation. The common bile duct measures 10 mm. No filling defects within the common bile duct. No focal hepatic lesions. Pancreas: There is inflammation surrounding the pancreatic head. There is inflammation in the porta hepatis with edema. No organized fluid collections. No pancreatic duct dilatation. No variant pancreatic ductal anatomy. Spleen: Normal spleen. Adrenals/urinary tract: Round lesion of the RIGHT adrenal gland measures 14 mm. This lesion has loss of signal intensity on opposed phase imaging (series 8) consistent with benign adrenal adenoma. LEFT adrenal gland normal. Several nonenhancing cysts within the LEFT kidney. The post-contrast T1 weighted images are severely degraded by patient respiratory motion. The RIGHT kidney is mal position within the upper pelvis. There is dilatation of the RIGHT renal pelvis and collecting system similar to CT 06/26/2014. Stomach/Bowel: Stomach and limited  of the small bowel is unremarkable Vascular/Lymphatic: Abdominal aortic normal caliber. No retroperitoneal periportal lymphadenopathy. Musculoskeletal: No aggressive osseous lesion IMPRESSION: 1. Gallbladder wall thickening is a characteristic of but not specific for cholecystitis. Elevated white blood cell count elevates suspicion. 2. Mild dilatation of the common bile duct without obstructing lesion. 3. Inflammation surrounding pancreatic head  and within the porta hepatis. Differential would include pancreatitis versus cholecystitis. 4. Chronic hydronephrosis of malposition RIGHT kidney in the pelvis. Electronically Signed   By: Suzy Bouchard M.D.   On: 04/12/2018 16:06   US Abdomen Limited Ruq  Result Date: 04/11/2018 CLINICAL DATA:  Right upper quadrant pain for 3 days EXAM: ULTRASOUND ABDOMEN LIMITED RIGHT UPPER QUADRANT COMPARISON:  CT 06/26/2014 FINDINGS: Gallbladder: Tumefactive sludge in the gallbladder which is distended. Negative sonographic Murphy. Small pericholecystic fluid. Wall thickening up to 7 mm. Common bile duct: Diameter: Upper normal at 6 mm Liver: Increased hepatic echogenicity. No focal hepatic abnormality. Portal vein is patent on color Doppler imaging with normal direction of blood flow towards the liver. IMPRESSION: Gallbladder sludge with wall thickening and trace pericholecystic fluid. Despite negative sonographic Percell Miller, concern would be for acalculous cholecystitis. Suggest correlation with nuclear medicine hepatobiliary imaging. Common bile duct upper normal. Increased hepatic echogenicity as may be seen with steatosis Electronically Signed   By: Donavan Foil M.D.   On: 04/11/2018 21:57     ASSESSMENT AND PLAN:   Active Problems:   Acute cholecystitis   84 year old male patient with abdominal pain, nausea, cancer anemia with jaundice clinically improving at this time MRCP is negative, patient seen by gastroenterology, surgery.  Ordered workup for hepatitis,  autoimmune labs.  HIDA scan also ordered for evaluation of gallbladder function.  Patient has no pancreatitis.  Patient seen by surgery, recommended no cholecystectomy or PTC tube.  Patient asymptomatic.  So monitor and LFTs are better today started on clear liquids . white count patient on Zosyn WBC normalized.  Continue antibiotic Zosyn till tomorrow. 2.  Out of bed to chair, physical therapy consult 3.  Hypotension, tachycardia likely due to dehydration, increased IV fluids.  At this time he is not symptomatic so monitor on telemetry. He is on low-dose metoprolol he can get that one. 3.  Diabetes mellitus type 2: Continue sliding scale insulin with coverage at this time. 4.  History of neuropathy continue Neurontin. BPH continue Flomax. Started on Aricept recently. Discussed with patient's daughter and also son at bedside today. High risk for cardiac arrest due to advanced age, multiple medical problems, questions discussed with family yesterday, at this time he is a full code. All the records are reviewed and case discussed with Care Management/Social Workerr. Management plans discussed with the patient, family and they are in agreement.  CODE STATUS: Full code  TOTAL TIME TAKING CARE OF THIS PATIENT: 35 minutes.   POSSIBLE D/C IN 1-2DAYS, DEPENDING ON CLINICAL CONDITION.   Epifanio Lesches M.D on 04/13/2018 at 1:06 PM  Between 7am to 6pm - Pager - [phone]  After 6pm go to [email or web address] - password [email or web address] [facility]  [facility]  Office  [phone]  CC: Primary care physician; [place] Pink, MD

## 2018-04-13 NOTE — Progress Notes (Addendum)
Received called from tele room patient had 5 beeps  SVT, patient asymptomatic , will continue  to monitor

## 2018-04-13 NOTE — Progress Notes (Signed)
Vonda Antigua, MD [address] [place], [place], [place], [address] [address], [place], [place], [place], [address] Phone: [phone]  Fax: [phone]   Subjective: Denies abdominal pain.  Tolerating oral diet.  No nausea vomiting.   Objective: Exam: Vital signs in last 24 hours: Vitals:   04/12/18 1648 04/12/18 2326 04/13/18 0300 04/13/18 0809  BP: 96/73 97/67  96/68  Pulse: 75 (!) 113  (!) 136  Resp:  19  20  Temp: 97.8 F (36.6 C) 98.7 F (37.1 C)  98.2 F (36.8 C)  TempSrc: Oral   Oral  SpO2: 98% 97%  95%  Weight:   152 lb (68.9 kg)   Height:       Weight change: -4 lb (-1.814 kg)  Intake/Output Summary (Last 24 hours) at 04/13/2018 1610 Last data filed at 04/13/2018 1500 Gross per 24 hour  Intake 1420 ml  Output -  Net 1420 ml    General: No acute distress, well developed male Abd: Soft, NT/ND, No HSM Skin: Warm, no rashes Neck: Supple, Trachea midline   Lab Results: Lab Results  Component Value Date   WBC 9.8 04/12/2018   HGB 14.8 04/12/2018   HCT 42.4 04/12/2018   MCV 90.1 04/12/2018   PLT 86 (L) 04/12/2018   Micro Results: Recent Results (from the past 240 hour(s))  MRSA PCR Screening     Status: None   Collection Time: 04/12/18  5:30 AM  Result Value Ref Range Status   MRSA by PCR NEGATIVE NEGATIVE Final    Comment:        The GeneXpert MRSA Assay (FDA approved for NASAL specimens only), is one component of a comprehensive MRSA colonization surveillance program. It is not intended to diagnose MRSA infection nor to guide or monitor treatment for MRSA infections. Performed at [facility], [address], [place], [place] [address]    Studies/Results: Mr 3d Recon At Scanner  Result Date: 04/12/2018 CLINICAL DATA:  Intermittent abdominal pain. Pain for 2-3 days. Elevated white blood cell count elevated bilirubin. Gallbladder ultrasound concern for cholecystitis. EXAM: MRI ABDOMEN WITHOUT AND WITH CONTRAST  (INCLUDING MRCP) TECHNIQUE: Multiplanar multisequence MR imaging of the abdomen was performed both before and after the administration of intravenous contrast. Heavily T2-weighted images of the biliary and pancreatic ducts were obtained, and three-dimensional MRCP images were rendered by post processing. CONTRAST:  73mL MULTIHANCE GADOBENATE DIMEGLUMINE 529 MG/ML IV SOLN COMPARISON:  Ultrasound 04/11/2018 FINDINGS: Respiratory motion degrades the postcontrast imaging. Lower chest:  Lung bases are clear. Hepatobiliary: The gallbladder wall is thickened to 6 mm. No discrete gallstones are identified. Small amount pericholecystic fluid. There is no intrahepatic duct dilatation. The common bile duct measures 10 mm. No filling defects within the common bile duct. No focal hepatic lesions. Pancreas: There is inflammation surrounding the pancreatic head. There is inflammation in the porta hepatis with edema. No organized fluid collections. No pancreatic duct dilatation. No variant pancreatic ductal anatomy. Spleen: Normal spleen. Adrenals/urinary tract: Round lesion of the RIGHT adrenal gland measures 14 mm. This lesion has loss of signal intensity on opposed phase imaging (series 8) consistent with benign adrenal adenoma. LEFT adrenal gland normal. Several nonenhancing cysts within the LEFT kidney. The post-contrast T1 weighted images are severely degraded by patient respiratory motion. The RIGHT kidney is mal position within the upper pelvis. There is dilatation of the RIGHT renal pelvis and collecting system similar to CT 06/26/2014. Stomach/Bowel: Stomach and limited of the small bowel is unremarkable Vascular/Lymphatic:  Abdominal aortic normal caliber. No retroperitoneal periportal lymphadenopathy. Musculoskeletal: No aggressive osseous lesion IMPRESSION: 1. Gallbladder wall thickening is a characteristic of but not specific for cholecystitis. Elevated white blood cell count elevates suspicion. 2. Mild dilatation of  the common bile duct without obstructing lesion. 3. Inflammation surrounding pancreatic head and within the porta hepatis. Differential would include pancreatitis versus cholecystitis. 4. Chronic hydronephrosis of malposition RIGHT kidney in the pelvis. Electronically Signed   By: Suzy Bouchard M.D.   On: 04/12/2018 16:06   Mr Abdomen Mrcp Moise Boring Contast  Result Date: 04/12/2018 CLINICAL DATA:  Intermittent abdominal pain. Pain for 2-3 days. Elevated white blood cell count elevated bilirubin. Gallbladder ultrasound concern for cholecystitis. EXAM: MRI ABDOMEN WITHOUT AND WITH CONTRAST (INCLUDING MRCP) TECHNIQUE: Multiplanar multisequence MR imaging of the abdomen was performed both before and after the administration of intravenous contrast. Heavily T2-weighted images of the biliary and pancreatic ducts were obtained, and three-dimensional MRCP images were rendered by post processing. CONTRAST:  77mL MULTIHANCE GADOBENATE DIMEGLUMINE 529 MG/ML IV SOLN COMPARISON:  Ultrasound 04/11/2018 FINDINGS: Respiratory motion degrades the postcontrast imaging. Lower chest:  Lung bases are clear. Hepatobiliary: The gallbladder wall is thickened to 6 mm. No discrete gallstones are identified. Small amount pericholecystic fluid. There is no intrahepatic duct dilatation. The common bile duct measures 10 mm. No filling defects within the common bile duct. No focal hepatic lesions. Pancreas: There is inflammation surrounding the pancreatic head. There is inflammation in the porta hepatis with edema. No organized fluid collections. No pancreatic duct dilatation. No variant pancreatic ductal anatomy. Spleen: Normal spleen. Adrenals/urinary tract: Round lesion of the RIGHT adrenal gland measures 14 mm. This lesion has loss of signal intensity on opposed phase imaging (series 8) consistent with benign adrenal adenoma. LEFT adrenal gland normal. Several nonenhancing cysts within the LEFT kidney. The post-contrast T1 weighted images  are severely degraded by patient respiratory motion. The RIGHT kidney is mal position within the upper pelvis. There is dilatation of the RIGHT renal pelvis and collecting system similar to CT 06/26/2014. Stomach/Bowel: Stomach and limited of the small bowel is unremarkable Vascular/Lymphatic: Abdominal aortic normal caliber. No retroperitoneal periportal lymphadenopathy. Musculoskeletal: No aggressive osseous lesion IMPRESSION: 1. Gallbladder wall thickening is a characteristic of but not specific for cholecystitis. Elevated white blood cell count elevates suspicion. 2. Mild dilatation of the common bile duct without obstructing lesion. 3. Inflammation surrounding pancreatic head and within the porta hepatis. Differential would include pancreatitis versus cholecystitis. 4. Chronic hydronephrosis of malposition RIGHT kidney in the pelvis. Electronically Signed   By: Suzy Bouchard M.D.   On: 04/12/2018 16:06   US Abdomen Limited Ruq  Result Date: 04/11/2018 CLINICAL DATA:  Right upper quadrant pain for 3 days EXAM: ULTRASOUND ABDOMEN LIMITED RIGHT UPPER QUADRANT COMPARISON:  CT 06/26/2014 FINDINGS: Gallbladder: Tumefactive sludge in the gallbladder which is distended. Negative sonographic Murphy. Small pericholecystic fluid. Wall thickening up to 7 mm. Common bile duct: Diameter: Upper normal at 6 mm Liver: Increased hepatic echogenicity. No focal hepatic abnormality. Portal vein is patent on color Doppler imaging with normal direction of blood flow towards the liver. IMPRESSION: Gallbladder sludge with wall thickening and trace pericholecystic fluid. Despite negative sonographic Percell Miller, concern would be for acalculous cholecystitis. Suggest correlation with nuclear medicine hepatobiliary imaging. Common bile duct upper normal. Increased hepatic echogenicity as may be seen with steatosis Electronically Signed   By: Donavan Foil M.D.   On: 04/11/2018 21:57   Medications:  Scheduled Meds: . docusate sodium  100 mg Oral BID  . finasteride  5 mg Oral Daily  . gabapentin  300 mg Oral QHS  . heparin  5,000 Units Subcutaneous Q8H  . insulin aspart  0-5 Units Subcutaneous QHS  . insulin aspart  0-9 Units Subcutaneous TID WC  . metoprolol tartrate  12.5 mg Oral BID  . simvastatin  40 mg Oral QPM  . tamsulosin  0.4 mg Oral Daily   Continuous Infusions: . sodium chloride 75 mL/hr at 04/13/18 0905  . piperacillin-tazobactam (ZOSYN)  IV 3.375 g (04/13/18 0920)   PRN Meds:.acetaminophen **OR** acetaminophen, bisacodyl, HYDROcodone-acetaminophen, ondansetron **OR** ondansetron (ZOFRAN) IV, traMADol, traZODone   Assessment: Active Problems:   Acute cholecystitis    Plan: Liver enzymes are improving Abdominal pain resolved Acute hepatits workup unrevealing for any other acute etiology besides cholecystitis  Liver enzymes are likely elevated due to porta hepatis inflammation, seen due to cholecystitis.  Normal lipase goes against pancreatitis HIDA scan was recommended but has not been done yet.  However, surgery is not recommending surgical intervention for cholecystitis at this time and HIDA scan might not change management.  MRCP did not show any evidence of biliary duct obstruction No gastric outlet obstruction Continue to avoid hepatotoxic drugs Continue daily CMP  Would defer to surgery for cholecystitis management If no surgical intervention on this admission would recommend surgery clinic follow up to ensure symptoms stay resolved, and if pain reoccurs to develop future plan of care.   GI service will sign off, please page with any questions.     LOS: 1 day   Vonda Antigua, MD 04/13/2018, 4:10 PM

## 2018-04-13 NOTE — Care Management CC44 (Signed)
Condition Code 44 Documentation Completed  Patient Details  Name: Matthew Schultz MRN: [id number] Date of Birth: 1932/11/03   Condition Code 44 given:  Yes Patient signature on Condition Code 44 notice:  Yes Documentation of 2 MD's agreement:  Yes Code 44 added to claim:  Yes    Jolly Mango, RN 04/13/2018, 4:16 PM

## 2018-04-13 NOTE — Plan of Care (Signed)
Patient remains confused, unable to educate at this time

## 2018-04-13 NOTE — Plan of Care (Signed)
Patient's knowledge of general education may not be improving because patient is confused and patient is not able to manage healthcare needs due to confusion.

## 2018-04-13 NOTE — Care Management CC44 (Signed)
Condition Code 44 Documentation Completed  Patient Details  Name: Matthew Schultz MRN: [id number] Date of Birth: Nov 16, 1932   Condition Code 44 given:  Yes Patient signature on Condition Code 44 notice:  Yes Documentation of 2 MD's agreement:  Yes Code 44 added to claim:  Yes    Jolly Mango, RN 04/13/2018, 4:16 PM

## 2018-04-13 NOTE — Care Management Obs Status (Signed)
[facility] NOTIFICATION   Patient Details  Name: Matthew Schultz MRN: [id number] Date of Birth: 1932/03/28   Medicare Observation Status Notification Given:  Yes    Jolly Mango, RN 04/13/2018, 4:16 PM

## 2018-04-13 NOTE — Progress Notes (Signed)
Initial Nutrition Assessment  DOCUMENTATION CODES:   Severe malnutrition in context of chronic illness  INTERVENTION:  Once diet able to be advanced recommend providing Ensure Enlive po TID, each supplement provides 350 kcal and 20 grams of protein.  Also recommend daily MVI with diet advancement.  NUTRITION DIAGNOSIS:   Severe Malnutrition related to chronic illness(advanced dementia) as evidenced by severe fat depletion, severe muscle depletion.  GOAL:   Patient will meet greater than or equal to 90% of their needs  MONITOR:   PO intake, Supplement acceptance, Diet advancement, Labs, Weight trends, I & O's  REASON FOR ASSESSMENT:   Malnutrition Screening Tool    ASSESSMENT:   84 year old male with PMHx of advanced dementia, arthritis, HLD, CAD, hx CVA, renal mass, OA, DM type 2, hydronephrosis, BPH admitted with cholecystitis.   -Plan is for HIDA scan.  Met with patient at bedside. He is unable to provide any history in setting of dementia. He only reports he is "in misery" right now. However he denied any N/V or abdominal pain. No family members present at time of RD assessment to provide history.  Patient is unsure how much he weighs or his weight history. Per chart patient was 156.8 lbs on 10/19/2016. Only weight that appears accurate from this admission was 151.7 lbs on 04/12/2018.  Meal Completion: 30% of clear liquids for breakfast today  Medications reviewed and include: Colace, Novolog 0-9 units TID, Novolog 0-5 units QHS, NS @ 100 mL/hr, Zosyn.  Labs reviewed: CBG 106-135, Potassium 3.3, BUN 27.  NUTRITION - FOCUSED PHYSICAL EXAM:    Most Recent Value  Orbital Region  Severe depletion  Upper Arm Region  Severe depletion  Thoracic and Lumbar Region  Moderate depletion  Buccal Region  Severe depletion  Temple Region  Severe depletion  Clavicle Bone Region  Severe depletion  Clavicle and Acromion Bone Region  Severe depletion  Scapular Bone Region  Severe  depletion  Dorsal Hand  Severe depletion  Patellar Region  Severe depletion  Anterior Thigh Region  Severe depletion  Posterior Calf Region  Severe depletion  Edema (RD Assessment)  Mild  Hair  Reviewed  Eyes  Unable to assess  Mouth  Unable to assess  Skin  Reviewed [ecchymosis]  Nails  Reviewed     Diet Order:  Diet clear liquid Room service appropriate? Yes; Fluid consistency: Thin  EDUCATION NEEDS:   Not appropriate for education at this time  Skin:  Skin Assessment: Reviewed RN Assessment  Last BM:  04/12/2018 - no BM characteristics documented  Height:   Ht Readings from Last 1 Encounters:  04/12/18 '5\' 9"'$  (1.753 m)    Weight:   Wt Readings from Last 1 Encounters:  04/13/18 152 lb (68.9 kg)    Ideal Body Weight:  72.7 kg  BMI:  Body mass index is 22.45 kg/m.  Estimated Nutritional Needs:   Kcal:  1660-1800 (MSJ x 1.2-1.3)  Protein:  80-90 grams (1.2-1.3 grams/kg)  Fluid:  1.7 L/day (25 mL/kg)  Willey Blade, MS, RD, LDN Office: [phone] Pager: [phone] After Hours/Weekend Pager: [phone]

## 2018-04-14 DIAGNOSIS — F0281 Dementia in other diseases classified elsewhere with behavioral disturbance: Secondary | ICD-10-CM | POA: Diagnosis not present

## 2018-04-14 DIAGNOSIS — N400 Enlarged prostate without lower urinary tract symptoms: Secondary | ICD-10-CM | POA: Diagnosis not present

## 2018-04-14 DIAGNOSIS — K810 Acute cholecystitis: Secondary | ICD-10-CM | POA: Diagnosis not present

## 2018-04-14 DIAGNOSIS — F03918 Unspecified dementia, unspecified severity, with other behavioral disturbance: Secondary | ICD-10-CM

## 2018-04-14 DIAGNOSIS — E119 Type 2 diabetes mellitus without complications: Secondary | ICD-10-CM | POA: Diagnosis not present

## 2018-04-14 DIAGNOSIS — G301 Alzheimer's disease with late onset: Secondary | ICD-10-CM

## 2018-04-14 DIAGNOSIS — F0391 Unspecified dementia with behavioral disturbance: Secondary | ICD-10-CM

## 2018-04-14 DIAGNOSIS — F0390 Unspecified dementia without behavioral disturbance: Secondary | ICD-10-CM | POA: Diagnosis not present

## 2018-04-14 DIAGNOSIS — E43 Unspecified severe protein-calorie malnutrition: Secondary | ICD-10-CM

## 2018-04-14 LAB — COMPREHENSIVE METABOLIC PANEL
ALT: 81 U/L — ABNORMAL HIGH (ref 17–63)
ANION GAP: 5 (ref 5–15)
AST: 44 U/L — AB (ref 15–41)
Albumin: 2.7 g/dL — ABNORMAL LOW (ref 3.5–5.0)
Alkaline Phosphatase: 208 U/L — ABNORMAL HIGH (ref 38–126)
BILIRUBIN TOTAL: 2.8 mg/dL — AB (ref 0.3–1.2)
BUN: 18 mg/dL (ref 6–20)
CHLORIDE: 105 mmol/L (ref 101–111)
CO2: 26 mmol/L (ref 22–32)
Calcium: 8 mg/dL — ABNORMAL LOW (ref 8.9–10.3)
Creatinine, Ser: 0.77 mg/dL (ref 0.61–1.24)
Glucose, Bld: 131 mg/dL — ABNORMAL HIGH (ref 65–99)
POTASSIUM: 3.3 mmol/L — AB (ref 3.5–5.1)
Sodium: 136 mmol/L (ref 135–145)
TOTAL PROTEIN: 6 g/dL — AB (ref 6.5–8.1)

## 2018-04-14 LAB — HEPATITIS C ANTIBODY (REFLEX): HCV Ab: <0.1 s/co ratio (ref 0.0–0.9)

## 2018-04-14 LAB — HEPATITIS B DNA, ULTRAQUANTITATIVE, PCR
HBV DNA SERPL PCR-ACNC: NOT DETECTED IU/mL
HBV DNA SERPL PCR-LOG IU: UNDETERMINED log10 IU/mL

## 2018-04-14 LAB — HEPATITIS B E ANTIGEN: HEP B E AG: NEGATIVE

## 2018-04-14 LAB — GLUCOSE, CAPILLARY
GLUCOSE-CAPILLARY: 104 mg/dL — AB (ref 65–99)
GLUCOSE-CAPILLARY: 90 mg/dL (ref 65–99)
GLUCOSE-CAPILLARY: 133 mg/dL — AB (ref 65–99)
Glucose-Capillary: 166 mg/dL — ABNORMAL HIGH (ref 65–99)

## 2018-04-14 LAB — ANA: Anti Nuclear Antibody(ANA): NEGATIVE

## 2018-04-14 LAB — HEPATITIS A ANTIBODY, TOTAL: Hep A Total Ab: POSITIVE — AB

## 2018-04-14 LAB — HEPATITIS A ANTIBODY, IGM: HEP A IGM: NEGATIVE

## 2018-04-14 LAB — HCV COMMENT:

## 2018-04-14 LAB — CMV IGM: CMV IgM: <30 AU/mL (ref 0.0–29.9)

## 2018-04-14 LAB — MITOCHONDRIAL ANTIBODIES: Mitochondrial M2 Ab, IgG: <20 Units (ref 0.0–20.0)

## 2018-04-14 LAB — MAGNESIUM: MAGNESIUM: 2 mg/dL (ref 1.7–2.4)

## 2018-04-14 LAB — HEPATITIS B E ANTIBODY: HEP B E AB: NEGATIVE

## 2018-04-14 LAB — ANTI-SMOOTH MUSCLE ANTIBODY, IGG: F-ACTIN AB IGG: 5 U (ref 0–19)

## 2018-04-14 LAB — HEPATITIS B SURFACE ANTIGEN: Hepatitis B Surface Ag: NEGATIVE

## 2018-04-14 LAB — HEPATITIS B CORE ANTIBODY, TOTAL: Hep B Core Total Ab: NEGATIVE

## 2018-04-14 MED ORDER — RISPERIDONE 0.5 MG PO TBDP
0.5000 mg | ORAL_TABLET | Freq: Every day | ORAL | Status: DC
  Administered 2018-04-14: 0.5 mg via ORAL
  Filled 2018-04-14 (×2): qty 1

## 2018-04-14 MED ORDER — DIPHENHYDRAMINE HCL 50 MG/ML IJ SOLN
12.5000 mg | Freq: Once | INTRAMUSCULAR | Status: AC
  Administered 2018-04-14: 12.5 mg via INTRAVENOUS
  Filled 2018-04-14: qty 1

## 2018-04-14 MED ORDER — POTASSIUM CHLORIDE 20 MEQ PO PACK
40.0000 meq | PACK | Freq: Once | ORAL | Status: AC
  Administered 2018-04-14: 40 meq via ORAL
  Filled 2018-04-14: qty 2

## 2018-04-14 MED ORDER — ADULT MULTIVITAMIN W/MINERALS CH
1.0000 | ORAL_TABLET | Freq: Every day | ORAL | Status: DC
  Administered 2018-04-15: 1 via ORAL
  Filled 2018-04-14: qty 1

## 2018-04-14 MED ORDER — ENSURE ENLIVE PO LIQD
237.0000 mL | Freq: Three times a day (TID) | ORAL | Status: DC
  Administered 2018-04-14 – 2018-04-15 (×4): 237 mL via ORAL

## 2018-04-14 MED ORDER — HALOPERIDOL LACTATE 5 MG/ML IJ SOLN
2.0000 mg | Freq: Four times a day (QID) | INTRAMUSCULAR | Status: DC | PRN
  Administered 2018-04-14 (×2): 2 mg via INTRAVENOUS
  Filled 2018-04-14 (×2): qty 1

## 2018-04-14 NOTE — Progress Notes (Addendum)
SURGICAL PROGRESS NOTE (cpt [phone])  Hospital Day(s): 1.   Post op day(s):  Marland Kitchen   Interval History: Patient seen and examined, no acute events or new complaints overnight. Though baseline dementia, patient clearly and consistently continues to deny any abdominal pain, nausea, or fever/chills. His family at bedside expresses concern that his appetite has been poor for "years" and that he continues to eat very little. He has, however, been tolerating regular diet.  Review of Systems:  Constitutional: denies fever, chills  HEENT: denies cough or congestion  Respiratory: denies any shortness of breath  Cardiovascular: denies chest pain or palpitations  Gastrointestinal: abdominal pain, N/V, and bowel function as per interval history Genitourinary: denies burning with urination or urinary frequency Musculoskeletal: denies pain, decreased motor or sensation Integumentary: denies any other rashes or skin discolorations Neurological: denies HA or vision/hearing changes   Vital signs in last 24 hours: [min-max] current  Temp:  [98.4 F (36.9 C)-98.6 F (37 C)] 98.6 F (37 C) (04/18 0736) Pulse Rate:  [61-75] 61 (04/18 0736) Resp:  [16-18] 18 (04/18 0736) BP: (123-135)/(70-77) 123/70 (04/18 0736) SpO2:  [96 %-99 %] 96 % (04/18 0736) Weight:  [161 lb (73 kg)] 161 lb (73 kg) (04/18 0404)     Height: 5\' 9"  (175.3 cm) Weight: 161 lb (73 kg) BMI (Calculated): 23.76   Intake/Output this shift:  No intake/output data recorded.   Intake/Output last 2 shifts:  @IOLAST2SHIFTS @   Physical Exam:  Constitutional: alert, cooperative and no distress, speaks happily without any evidence of discomfort, though flight of thoughts and easily distracted HENT: normocephalic without obvious abnormality  Eyes: PERRL, EOM's grossly intact and symmetric  Neuro: CN II - XII grossly intact and symmetric without deficit  Respiratory: breathing non-labored at rest  Cardiovascular: regular rate and sinus rhythm   Gastrointestinal: soft, completely non-tender with even deep palpation and non-distended Musculoskeletal: UE and LE FROM, no edema or wounds  Labs:  CBC Latest Ref Rng & Units 04/12/2018 04/11/2018 10/20/2016  WBC 3.8 - 10.6 K/uL 9.8 12.2(H) 7.3  Hemoglobin 13.0 - 18.0 g/dL 14.8 15.1 13.9  Hematocrit 40.0 - 52.0 % 42.4 43.3 39.4(L)  Platelets 150 - 440 K/uL 86(L) 100(L) 123(L)   CMP Latest Ref Rng & Units 04/14/2018 04/13/2018 04/12/2018  Glucose 65 - 99 mg/dL 131(H) 109(H) 135(H)  BUN 6 - 20 mg/dL 18 27(H) 30(H)  Creatinine 0.61 - 1.24 mg/dL 0.77 0.75 1.04  Sodium 135 - 145 mmol/L 136 135 130(L)  Potassium 3.5 - 5.1 mmol/L 3.3(L) 3.3(L) 3.4(L)  Chloride 101 - 111 mmol/L 105 105 99(L)  CO2 22 - 32 mmol/L 26 24 24   Calcium 8.9 - 10.3 mg/dL 8.0(L) 7.9(L) 7.8(L)  Total Protein 6.5 - 8.1 g/dL 6.0(L) 5.7(L) 6.3(L)  Total Bilirubin 0.3 - 1.2 mg/dL 2.8(H) 3.1(H) 4.1(H)  Alkaline Phos 38 - 126 U/L 208(H) 157(H) 149(H)  AST 15 - 41 U/L 44(H) 42(H) 60(H)  ALT 17 - 63 U/L 81(H) 97(H) 136(H)   Imaging studies: No new pertinent imaging studies   Assessment/Plan: (ICD-10's: E80.6) 84 y.o. male with improving hyperbilirubinemia, transaminasemia, and normalized mild leukocytosis, mild (6 mm) gallbladder wall thickening without cholelithiasis, and 10 mm common bile duct without any evidence to suggest abdominal pain/discomfort at this time, complicated by pertinent comorbidities including advanced chronological age, advanced baseline dementia, DM, HTN, HLD, CAD, history of stroke, BPH and incomplete bladder emptying with hydroneprhosis, and osteoarthritis.              - GI  recommendations appreciated, saw patient with Dr. Bonna Gains and discussed             - no indication for surgical intervention or percutaneous cholecystostomy drain at this time             - if patient were to establish/develop RUQ/epigastric abdominal pain, leukocytosis, etc, would advise percutaneous cholecystostomy drain,  recognizing that cholecystostomy drain would not address any choledocholithiasis not visualized on MRCP  - HIDA imaging not likely to justify surgery or cholecystostomy drain for asymptomatic patient             - medical management of comorbidities as per primary medical team             - okay with clear liquids diet, will signoff  - please call if questions/concerns  All of the above findings and recommendations were discussed with patient's family and GI physician.  Thank you for the opportunity to participate in this patient's care.  -- Marilynne Drivers Rosana Hoes, MD, [place]: [facility] - Partnering for exceptional care. Office: [phone]

## 2018-04-14 NOTE — Progress Notes (Signed)
,   HIDA scan is ordered but he is really agitated, confused due to underlying dementia, he cannot lie down flat.  Hold the HIDA scan for now due to his mental condition due to dementia.

## 2018-04-14 NOTE — Consult Note (Signed)
[facility] Consult   Reason for Consult: Consult for 84 year old man with history of dementia currently in the hospital with cholecystitis Referring Physician: Vianne Bulls Patient Identification: Matthew Schultz MRN:  [id number] Principal Diagnosis: Dementia with behavioral disturbance Diagnosis:   Patient Active Problem List   Diagnosis Date Noted  . Protein-calorie malnutrition, severe [E43] 04/14/2018  . Dementia with behavioral disturbance [F03.91] 04/14/2018  . Acute cholecystitis [K81.0] 04/12/2018  . Choledocholithiasis with acute cholecystitis [K80.42]   . Syncope [R55] 10/19/2016  . AKI (acute kidney injury) ([place]) [N17.9] 10/19/2016  . Cataract [H26.9] 09/07/2016  . Coronary artery disease [I25.10] 09/07/2016  . Diabetes ([place]) [E11.9] 09/07/2016  . HTN (hypertension) [I10] 09/07/2016  . Hyperlipidemia [E78.5] 09/07/2016  . Osteoarthritis [M19.90] 09/07/2016  . Stroke ([place]) [I63.9] 09/07/2016  . DDD (degenerative disc disease), lumbar [M51.36] 08/23/2014  . Lumbar radiculitis [M54.16] 08/23/2014  . Low back pain [M54.5] 06/01/2014  . Lumbar radiculopathy [M54.16] 06/01/2014    Total Time spent with patient: 1 hour  Subjective:   Matthew Schultz is a 84 y.o. male patient admitted with "yes it is very good" patient not able to give any lucid history.Marland Kitchen  HPI: Patient seen chart reviewed.  84 year old man who is in the hospital with acute cholecystitis.  He has apparently displayed some problems with agitation since coming into the hospital.  Family present in the room tell me that he has been yanking on lines and tubes and trying to crawl out of bed intermittently.  When I came to see him he was awake with family in the room and the lights were on and the patient was calm and appeared to have a pleasant affect but was completely confused.  He was able to tell me his name but really could not answer any other questions lucidly.  Did not know where he was.  Not  able to tell me any physical complaints.  Could not even identify the family members in the room with him.  Social history: Appears to have supportive family.  I am not certain whether he is still living at home or not.  Medical history: Currently with cholecystitis history of diabetes high blood pressure multiple medical problems weight loss  Substance abuse history: Nothing identified  Past Psychiatric History: No past psychiatric history identified at all except for an acknowledgment of dementia on the patient's part.  Risk to Self: Is patient at risk for suicide?: No Risk to Others:   Prior Inpatient Therapy:   Prior Outpatient Therapy:    Past Medical History:  Past Medical History:  Diagnosis Date  . Arthritis   . BPH (benign prostatic hyperplasia)   . CAD (coronary artery disease)   . DM (diabetes mellitus) ([place])   . HLD (hyperlipidemia)   . HTN (hypertension)   . Hydronephrosis   . Incomplete bladder emptying   . OA (osteoarthritis)   . Renal mass   . Stroke [facility])     Past Surgical History:  Procedure Laterality Date  . BACK SURGERY     Family History:  Family History  Problem Relation Age of Onset  . Pneumonia Father   . Kidney cancer Neg Hx   . Kidney disease Neg Hx   . Prostate cancer Neg Hx    Family Psychiatric  History: Unknown Social History:  Social History   Substance and Sexual Activity  Alcohol Use No     Social History   Substance and Sexual Activity  Drug Use No  Social History   Socioeconomic History  . Marital status: Married    Spouse name: Not on file  . Number of children: Not on file  . Years of education: Not on file  . Highest education level: Not on file  Occupational History  . Not on file  Social Needs  . Financial resource strain: Not on file  . Food insecurity:    Worry: Not on file    Inability: Not on file  . Transportation needs:    Medical: Not on file    Non-medical: Not on file  Tobacco Use  . Smoking  status: Former Smoker    Types: Cigarettes  . Smokeless tobacco: Current User    Types: Chew  Substance and Sexual Activity  . Alcohol use: No  . Drug use: No  . Sexual activity: Not on file  Lifestyle  . Physical activity:    Days per week: Not on file    Minutes per session: Not on file  . Stress: Not on file  Relationships  . Social connections:    Talks on phone: Not on file    Gets together: Not on file    Attends religious service: Not on file    Active member of club or organization: Not on file    Attends meetings of clubs or organizations: Not on file    Relationship status: Not on file  Other Topics Concern  . Not on file  Social History Narrative  . Not on file   Additional Social History:    Allergies:  No Known Allergies  Labs:  Results for orders placed or performed during the hospital encounter of 04/11/18 (from the past 48 hour(s))  Glucose, capillary     Status: Abnormal   Collection Time: 04/12/18  9:11 PM  Result Value Ref Range   Glucose-Capillary 120 (H) 65 - 99 mg/dL   Comment 1 Notify RN   Hepatitis A antibody, total     Status: Abnormal   Collection Time: 04/13/18  4:20 AM  Result Value Ref Range   Hep A Total Ab Positive (A) Negative    Comment: (NOTE) Performed At: [facility] [place], [place] [id number] Rush Farmer MD [id number] Performed at [facility], [address] [place], [place] [address]   Hepatitis A antibody, IgM     Status: None   Collection Time: 04/13/18  4:20 AM  Result Value Ref Range   Hep A IgM Negative Negative    Comment: (NOTE) Performed At: [facility] [place], [place] [id number] Rush Farmer MD [id number] Performed at [facility], [address] [place], [place] [address]   Hepatitis c antibody (reflex)     Status: None   Collection Time: 04/13/18  4:20 AM  Result Value Ref Range   HCV Ab <0.1 0.0 - 0.9 s/co ratio     Comment: (NOTE) Performed At: [facility] [place], [place] [id number] Rush Farmer MD [id number] Performed at [facility], [address] [place],  [address]   Hepatitis B core antibody, total     Status: None   Collection Time: 04/13/18  4:20 AM  Result Value Ref Range   Hep B Core Total Ab Negative Negative    Comment: (NOTE) Performed At: [facility] [place], [place] [id number] Rush Farmer MD [id number] Performed at [facility], [address] [place],  [address]   Hepatitis B surface antigen  Status: None   Collection Time: 04/13/18  4:20 AM  Result Value Ref Range   Hepatitis B Surface Ag Negative Negative    Comment: (NOTE) Performed At: [facility] [place], [place] [id number] Rush Farmer MD [id number] Performed at [facility], [address] [place], [place] [address]   Hepatitis B e antibody     Status: None   Collection Time: 04/13/18  4:20 AM  Result Value Ref Range   Hep B E Ab Negative Negative    Comment: (NOTE) Performed At: [facility] [place], [place] [id number] Rush Farmer MD [id number] Performed at [facility], [address] [place], [place] [address]   Hepatitis B E antigen     Status: None   Collection Time: 04/13/18  4:20 AM  Result Value Ref Range   Hep B E Ag Negative Negative    Comment: (NOTE) Performed At: [facility] [place], [place] [id number] Rush Farmer MD [id number] Performed at [facility], [address] [place], [place] [address]   ANA     Status: None   Collection Time: 04/13/18  4:20 AM  Result Value Ref Range   Anit Nuclear Antibody(ANA) Negative Negative    Comment: (NOTE) Performed At: [facility] [place], [place] [id number] Rush Farmer MD [id number] Performed at  [facility], [address] [place], [place] [address]   Mitochondrial antibodies     Status: None   Collection Time: 04/13/18  4:20 AM  Result Value Ref Range   Mitochondrial M2 Ab, IgG <20.0 0.0 - 20.0 Units    Comment: (NOTE)                                Negative    0.0 - 20.0                                Equivocal  20.1 - 24.9                                Positive         >24.9 Mitochondrial (M2) Antibodies are found in 90-96% of patients with primary biliary cirrhosis. Performed At: [facility] [place], [place] [id number] Rush Farmer MD [id number] Performed at [facility], [address] [place], [place] [address]   Anti-smooth muscle antibody, IgG     Status: None   Collection Time: 04/13/18  4:20 AM  Result Value Ref Range   F-Actin IgG 5 0 - 19 Units    Comment: (NOTE)                 Negative                     0 - 19                 Weak positive               20 - 30                 Moderate to strong positive     >30 Actin Antibodies are found in 52-85% of patients with autoimmune hepatitis or chronic active hepatitis and in 22% of patients  with primary biliary cirrhosis. Performed At: [facility] [place], [place] [id number] Rush Farmer MD [id number] Performed at [facility], [address] [place], [place] [address]   Bilirubin, direct     Status: Abnormal   Collection Time: 04/13/18  4:20 AM  Result Value Ref Range   Bilirubin, Direct 1.6 (H) 0.1 - 0.5 mg/dL    Comment: Performed at [facility], [address] [place], [place] [address]  CMV IgM     Status: None   Collection Time: 04/13/18  4:20 AM  Result Value Ref Range   CMV IgM <30.0 0.0 - 29.9 AU/mL    Comment: (NOTE)                                Negative         <30.0                                Equivocal  30.0 - 34.9                                Positive         >34.9 A  positive result is generally indicative of acute infection, reactivation or persistent IgM production. Performed At: [facility] [place], [place] [id number] Rush Farmer MD [id number] Performed at [facility], [address] [place], [place] [address]   Comprehensive metabolic panel     Status: Abnormal   Collection Time: 04/13/18  4:20 AM  Result Value Ref Range   Sodium 135 135 - 145 mmol/L   Potassium 3.3 (L) 3.5 - 5.1 mmol/L   Chloride 105 101 - 111 mmol/L   CO2 24 22 - 32 mmol/L   Glucose, Bld 109 (H) 65 - 99 mg/dL   BUN 27 (H) 6 - 20 mg/dL   Creatinine, Ser 0.75 0.61 - 1.24 mg/dL   Calcium 7.9 (L) 8.9 - 10.3 mg/dL   Total Protein 5.7 (L) 6.5 - 8.1 g/dL   Albumin 2.6 (L) 3.5 - 5.0 g/dL   AST 42 (H) 15 - 41 U/L   ALT 97 (H) 17 - 63 U/L   Alkaline Phosphatase 157 (H) 38 - 126 U/L   Total Bilirubin 3.1 (H) 0.3 - 1.2 mg/dL   GFR calc non Af Amer >60 >60 mL/min   GFR calc Af Amer >60 >60 mL/min    Comment: (NOTE) The eGFR has been calculated using the CKD EPI equation. This calculation has not been validated in all clinical situations. eGFR's persistently <60 mL/min signify possible Chronic Kidney Disease.    Anion gap 6 5 - 15    Comment: Performed at [facility], [address] [place], [place] [address]  HCV Comment:     Status: None   Collection Time: 04/13/18  4:20 AM  Result Value Ref Range   Comment: Comment     Comment: (NOTE) Non reactive HCV antibody screen is consistent with no HCV infection, unless recent infection is suspected or other evidence exists to indicate HCV infection. Performed At: [facility] [place], [place] [id number] Rush Farmer MD [id number] Performed at [facility], [address] [place], [place] [address]   Glucose, capillary     Status: Abnormal  Collection Time: 04/13/18  8:08 AM  Result Value Ref Range   Glucose-Capillary 106 (H) 65 - 99  mg/dL  Glucose, capillary     Status: Abnormal   Collection Time: 04/13/18 11:43 AM  Result Value Ref Range   Glucose-Capillary 135 (H) 65 - 99 mg/dL  Glucose, capillary     Status: Abnormal   Collection Time: 04/13/18  4:45 PM  Result Value Ref Range   Glucose-Capillary 131 (H) 65 - 99 mg/dL  Glucose, capillary     Status: Abnormal   Collection Time: 04/13/18  8:09 PM  Result Value Ref Range   Glucose-Capillary 148 (H) 65 - 99 mg/dL  Comprehensive metabolic panel     Status: Abnormal   Collection Time: 04/14/18  4:06 AM  Result Value Ref Range   Sodium 136 135 - 145 mmol/L   Potassium 3.3 (L) 3.5 - 5.1 mmol/L   Chloride 105 101 - 111 mmol/L   CO2 26 22 - 32 mmol/L   Glucose, Bld 131 (H) 65 - 99 mg/dL   BUN 18 6 - 20 mg/dL   Creatinine, Ser 0.77 0.61 - 1.24 mg/dL   Calcium 8.0 (L) 8.9 - 10.3 mg/dL   Total Protein 6.0 (L) 6.5 - 8.1 g/dL   Albumin 2.7 (L) 3.5 - 5.0 g/dL   AST 44 (H) 15 - 41 U/L   ALT 81 (H) 17 - 63 U/L   Alkaline Phosphatase 208 (H) 38 - 126 U/L   Total Bilirubin 2.8 (H) 0.3 - 1.2 mg/dL   GFR calc non Af Amer >60 >60 mL/min   GFR calc Af Amer >60 >60 mL/min    Comment: (NOTE) The eGFR has been calculated using the CKD EPI equation. This calculation has not been validated in all clinical situations. eGFR's persistently <60 mL/min signify possible Chronic Kidney Disease.    Anion gap 5 5 - 15    Comment: Performed at [facility], [address] [place], [place] [address]  Magnesium     Status: None   Collection Time: 04/14/18  4:06 AM  Result Value Ref Range   Magnesium 2.0 1.7 - 2.4 mg/dL    Comment: Performed at [facility], [address] [place], [place] [address]  Glucose, capillary     Status: Abnormal   Collection Time: 04/14/18  7:43 AM  Result Value Ref Range   Glucose-Capillary 104 (H) 65 - 99 mg/dL  Glucose, capillary     Status: None   Collection Time: 04/14/18 12:23 PM  Result Value Ref Range   Glucose-Capillary 90  65 - 99 mg/dL  Glucose, capillary     Status: Abnormal   Collection Time: 04/14/18  4:52 PM  Result Value Ref Range   Glucose-Capillary 133 (H) 65 - 99 mg/dL    Current Facility-Administered Medications  Medication Dose Route Frequency Provider Last Rate Last Dose  . acetaminophen (TYLENOL) tablet 650 mg  650 mg Oral Q6H PRN Amelia Jo, MD       Or  . acetaminophen (TYLENOL) suppository 650 mg  650 mg Rectal Q6H PRN Amelia Jo, MD      . bisacodyl (DULCOLAX) EC tablet 5 mg  5 mg Oral Daily PRN Amelia Jo, MD      . docusate sodium (COLACE) capsule 100 mg  100 mg Oral BID Amelia Jo, MD   100 mg at 04/14/18 0010  . feeding supplement (ENSURE ENLIVE) (ENSURE ENLIVE) liquid 237 mL  237 mL Oral TID BM Epifanio Lesches, MD  237 mL at 04/14/18 1435  . finasteride (PROSCAR) tablet 5 mg  5 mg Oral Daily Amelia Jo, MD   5 mg at 04/14/18 1231  . gabapentin (NEURONTIN) capsule 300 mg  300 mg Oral QHS Amelia Jo, MD   300 mg at 04/14/18 0010  . haloperidol lactate (HALDOL) injection 2 mg  2 mg Intravenous Q6H PRN Harrie Foreman, MD   2 mg at 04/14/18 0177  . heparin injection 5,000 Units  5,000 Units Subcutaneous Q8H Amelia Jo, MD   5,000 Units at 04/14/18 1541  . HYDROcodone-acetaminophen (NORCO/VICODIN) 5-325 MG per tablet 1-2 tablet  1-2 tablet Oral Q4H PRN Amelia Jo, MD      . insulin aspart (novoLOG) injection 0-5 Units  0-5 Units Subcutaneous QHS Amelia Jo, MD      . insulin aspart (novoLOG) injection 0-9 Units  0-9 Units Subcutaneous TID WC Amelia Jo, MD   1 Units at 04/13/18 1728  . metoprolol tartrate (LOPRESSOR) tablet 12.5 mg  12.5 mg Oral BID Epifanio Lesches, MD   12.5 mg at 04/14/18 1230  . [START ON 04/15/2018] multivitamin with minerals tablet 1 tablet  1 tablet Oral Daily Epifanio Lesches, MD      . ondansetron [facility]) tablet 4 mg  4 mg Oral Q6H PRN Amelia Jo, MD       Or  . ondansetron [facility]) injection 4 mg  4 mg Intravenous  Q6H PRN Amelia Jo, MD      . piperacillin-tazobactam (ZOSYN) IVPB 3.375 g  3.375 g Intravenous Tera Partridge, MD 12.5 mL/hr at 04/14/18 1542 3.375 g at 04/14/18 1542  . risperiDONE (RISPERDAL M-TABS) disintegrating tablet 0.5 mg  0.5 mg Oral QHS Clapacs, John T, MD      . simvastatin (ZOCOR) tablet 40 mg  40 mg Oral QPM Amelia Jo, MD   40 mg at 04/13/18 1723  . tamsulosin (FLOMAX) capsule 0.4 mg  0.4 mg Oral Daily Amelia Jo, MD   0.4 mg at 04/14/18 1231  . traMADol (ULTRAM) tablet 50 mg  50 mg Oral Q8H PRN Amelia Jo, MD      . traZODone (DESYREL) tablet 25 mg  25 mg Oral QHS PRN Amelia Jo, MD   25 mg at 04/14/18 0005    Musculoskeletal: Strength & Muscle Tone: decreased Gait & Station: unsteady Patient leans: N/A  Psychiatric Specialty Exam: Physical Exam  Nursing note and vitals reviewed. Constitutional: He appears well-developed.  HENT:  Head: Normocephalic and atraumatic.  Eyes: Pupils are equal, round, and reactive to light. Conjunctivae are normal.  Neck: Normal range of motion.  Cardiovascular: Regular rhythm and normal heart sounds.  Respiratory: Effort normal. No respiratory distress.  GI: Soft.  Musculoskeletal: Normal range of motion.  Neurological: He is alert.  Skin: Skin is warm and dry.  Psychiatric: His affect is blunt. His speech is delayed and tangential. He is not agitated and not aggressive. Thought content is not paranoid. Cognition and memory are impaired. He expresses inappropriate judgment. He expresses no homicidal and no suicidal ideation. He exhibits abnormal recent memory and abnormal remote memory.    Review of Systems  Unable to perform ROS: Dementia    Blood pressure 118/64, pulse 72, temperature 98.1 F (36.7 C), temperature source Oral, resp. rate 18, height '5\' 9"'$  (1.753 m), weight 161 lb (73 kg), SpO2 98 %.Body mass index is 23.78 kg/m.  General Appearance: Casual  Eye Contact:  Minimal  Speech:  Slow  Volume:  Decreased   Mood:  Euthymic  Affect:  Constricted  Thought Process:  Disorganized  Orientation:  Negative  Thought Content:  Negative  Suicidal Thoughts:  No  Homicidal Thoughts:  No  Memory:  Negative  Judgement:  Negative  Insight:  Negative  Psychomotor Activity:  Restlessness  Concentration:  Concentration: Negative  Recall:  Negative  Fund of Knowledge:  Negative  Language:  Negative  Akathisia:  Negative  Handed:  Right  AIMS (if indicated):     Assets:  Social Support  ADL's:  Impaired  Cognition:  Impaired,  Moderate and Severe  Sleep:        Treatment Plan Summary: Medication management and Plan 84 year old man with dementia who on my evaluation shows moderate to severe levels of dementia.  Very disorganized.  Clearly no idea where he is or what is going on.  Clearly no capacity currently to make medical decisions.  High risk of agitation in the hospital.  Reviewed chart.  Already an order in place for Haldol as needed IV which is an appropriate medicine.  No change to that.  Risk of sundowning and agitation is higher at night.  I have added 0.5 mg of Risperdal at night orally.  We will follow-up as needed.  Disposition: No evidence of imminent risk to self or others at present.   Patient does not meet criteria for psychiatric inpatient admission. Supportive therapy provided about ongoing stressors.  Alethia Berthold, MD 04/14/2018 5:11 PM

## 2018-04-14 NOTE — Progress Notes (Signed)
Called Pt daughter to see if a family member could come and visit. Remains severely confused.

## 2018-04-14 NOTE — Progress Notes (Signed)
[place] at [place] NAME: Matthew Schultz    MR#:  [id number]  DATE OF BIRTH:  July 09, 1932  SUBJECTIVE: Patient admitted for evaluation of jaundice, elevated LFTs with nausea, abdominal pain.  MRCP is essentially normal.  His LFTs are improving.  Patient has gallstones,  CHIEF COMPLAINT:   Chief Complaint  Patient presents with  . abnormal labs   Very confused,, agitated, patient's family at bedside, son told me that he is been having this confusion, memory problem, dementia getting worse recently and fallen 2 times at home.  Patient appears restless and wants to get out of the chair.  He is sitting in a recliner. REVIEW OF SYSTEMS:    Patient is a confused, unable to obtain review of systems. Nutrition: Tolerating Diet: Tolerating PT:      DRUG ALLERGIES:  No Known Allergies  VITALS:  Blood pressure 123/70, pulse 61, temperature 98.6 F (37 C), temperature source Oral, resp. rate 18, height '5\' 9"'$  (1.753 m), weight 73 kg (161 lb), SpO2 96 %.  PHYSICAL EXAMINATION:   Physical Exam  GENERAL:  84 y.o.-year-old patient lying in the bed with no acute distress.  Appears jaundiced. EYES: Pupils equal, round, reactive to light and accommodation. No scleral icterus. Extraocular muscles intact.  HEENT: Head atraumatic, normocephalic. Oropharynx and nasopharynx clear.  NECK:  Supple, no jugular venous distention. No thyroid enlargement, no tenderness.  LUNGS: Normal breath sounds bilaterally, no wheezing, rales,rhonchi or crepitation. No use of accessory muscles of respiration.  CARDIOVASCULAR: S1, S2 normal. No murmurs, rubs, or gallops.  ABDOMEN: Soft, nontender, nondistended. Bowel sounds present. No organomegaly or mass.  EXTREMITIES: No pedal edema, cyanosis, or clubbing.  NEUROLOGIC: Patient is confused, does not appear to have any focal neurological deficit.  PSYCHIATRIC: The patient is alert and oriented x 3.  SKIN: No obvious  rash, lesion, or ulcer.  Hard of hearing.  LABORATORY PANEL:   CBC Recent Labs  Lab 04/12/18 0526  WBC 9.8  HGB 14.8  HCT 42.4  PLT 86*   ------------------------------------------------------------------------------------------------------------------  Chemistries  Recent Labs  Lab 04/14/18 0406  NA 136  K 3.3*  CL 105  CO2 26  GLUCOSE 131*  BUN 18  CREATININE 0.77  CALCIUM 8.0*  MG 2.0  AST 44*  ALT 81*  ALKPHOS 208*  BILITOT 2.8*   ------------------------------------------------------------------------------------------------------------------  Cardiac Enzymes No results for input(s): TROPONINI in the last 168 hours. ------------------------------------------------------------------------------------------------------------------  RADIOLOGY:  Mr 3d Recon At Scanner  Result Date: 04/12/2018 CLINICAL DATA:  Intermittent abdominal pain. Pain for 2-3 days. Elevated white blood cell count elevated bilirubin. Gallbladder ultrasound concern for cholecystitis. EXAM: MRI ABDOMEN WITHOUT AND WITH CONTRAST (INCLUDING MRCP) TECHNIQUE: Multiplanar multisequence MR imaging of the abdomen was performed both before and after the administration of intravenous contrast. Heavily T2-weighted images of the biliary and pancreatic ducts were obtained, and three-dimensional MRCP images were rendered by post processing. CONTRAST:  9m MULTIHANCE GADOBENATE DIMEGLUMINE 529 MG/ML IV SOLN COMPARISON:  Ultrasound 04/11/2018 FINDINGS: Respiratory motion degrades the postcontrast imaging. Lower chest:  Lung bases are clear. Hepatobiliary: The gallbladder wall is thickened to 6 mm. No discrete gallstones are identified. Small amount pericholecystic fluid. There is no intrahepatic duct dilatation. The common bile duct measures 10 mm. No filling defects within the common bile duct. No focal hepatic lesions. Pancreas: There is inflammation surrounding the pancreatic head. There is inflammation in the  porta hepatis with edema. No organized fluid collections. No pancreatic duct  dilatation. No variant pancreatic ductal anatomy. Spleen: Normal spleen. Adrenals/urinary tract: Round lesion of the RIGHT adrenal gland measures 14 mm. This lesion has loss of signal intensity on opposed phase imaging (series 8) consistent with benign adrenal adenoma. LEFT adrenal gland normal. Several nonenhancing cysts within the LEFT kidney. The post-contrast T1 weighted images are severely degraded by patient respiratory motion. The RIGHT kidney is mal position within the upper pelvis. There is dilatation of the RIGHT renal pelvis and collecting system similar to CT 06/26/2014. Stomach/Bowel: Stomach and limited of the small bowel is unremarkable Vascular/Lymphatic: Abdominal aortic normal caliber. No retroperitoneal periportal lymphadenopathy. Musculoskeletal: No aggressive osseous lesion IMPRESSION: 1. Gallbladder wall thickening is a characteristic of but not specific for cholecystitis. Elevated white blood cell count elevates suspicion. 2. Mild dilatation of the common bile duct without obstructing lesion. 3. Inflammation surrounding pancreatic head and within the porta hepatis. Differential would include pancreatitis versus cholecystitis. 4. Chronic hydronephrosis of malposition RIGHT kidney in the pelvis. Electronically Signed   By: Suzy Bouchard M.D.   On: 04/12/2018 16:06   Mr Abdomen Mrcp Moise Boring Contast  Result Date: 04/12/2018 CLINICAL DATA:  Intermittent abdominal pain. Pain for 2-3 days. Elevated white blood cell count elevated bilirubin. Gallbladder ultrasound concern for cholecystitis. EXAM: MRI ABDOMEN WITHOUT AND WITH CONTRAST (INCLUDING MRCP) TECHNIQUE: Multiplanar multisequence MR imaging of the abdomen was performed both before and after the administration of intravenous contrast. Heavily T2-weighted images of the biliary and pancreatic ducts were obtained, and three-dimensional MRCP images were rendered by post  processing. CONTRAST:  75m MULTIHANCE GADOBENATE DIMEGLUMINE 529 MG/ML IV SOLN COMPARISON:  Ultrasound 04/11/2018 FINDINGS: Respiratory motion degrades the postcontrast imaging. Lower chest:  Lung bases are clear. Hepatobiliary: The gallbladder wall is thickened to 6 mm. No discrete gallstones are identified. Small amount pericholecystic fluid. There is no intrahepatic duct dilatation. The common bile duct measures 10 mm. No filling defects within the common bile duct. No focal hepatic lesions. Pancreas: There is inflammation surrounding the pancreatic head. There is inflammation in the porta hepatis with edema. No organized fluid collections. No pancreatic duct dilatation. No variant pancreatic ductal anatomy. Spleen: Normal spleen. Adrenals/urinary tract: Round lesion of the RIGHT adrenal gland measures 14 mm. This lesion has loss of signal intensity on opposed phase imaging (series 8) consistent with benign adrenal adenoma. LEFT adrenal gland normal. Several nonenhancing cysts within the LEFT kidney. The post-contrast T1 weighted images are severely degraded by patient respiratory motion. The RIGHT kidney is mal position within the upper pelvis. There is dilatation of the RIGHT renal pelvis and collecting system similar to CT 06/26/2014. Stomach/Bowel: Stomach and limited of the small bowel is unremarkable Vascular/Lymphatic: Abdominal aortic normal caliber. No retroperitoneal periportal lymphadenopathy. Musculoskeletal: No aggressive osseous lesion IMPRESSION: 1. Gallbladder wall thickening is a characteristic of but not specific for cholecystitis. Elevated white blood cell count elevates suspicion. 2. Mild dilatation of the common bile duct without obstructing lesion. 3. Inflammation surrounding pancreatic head and within the porta hepatis. Differential would include pancreatitis versus cholecystitis. 4. Chronic hydronephrosis of malposition RIGHT kidney in the pelvis. Electronically Signed   By: SSuzy BouchardM.D.   On: 04/12/2018 16:06     ASSESSMENT AND PLAN:   Active Problems:   Acute cholecystitis   Protein-calorie malnutrition, severe   84year old male patient with abdominal pain, nausea, cancer anemia with jaundice clinically improving at this time MRCP is negative, patient seen by gastroenterology, surgery.  Ordered workup for hepatitis, autoimmune labs.  hordered  for evaluation of gallbladder function.  Patient has no pancreatitis.  Patient seen by surgery, recommended no cholecystectomy or PTC tube.  Patient asymptomatic.  On clear liquids.  Advance the diet to regular diet.  HIDA scan ordered yesterday but not done.   3.  Hypotension, tachycardia likely due to dehydration, increased IV fluids.  Improved.   He is on low-dose metoprolol he can get that one. 3.  Diabetes mellitus type 2: Continue sliding scale insulin with coverage at this time. 4.  History of neuropathy continue Neurontin. BPH continue Flomax. Started on Aricept recently. #5 dementia, agitation, according to son patient having these episodes of agitation, he is trying to get to the chair, son mentioned that he had fallen 2 times at home.  Get psychiatric consult, physical therapy consult, advance the diet today, ordered HIDA scan. High risk for cardiac arrest due to advanced age, multiple medical problems, questions discussed with family yesterday, at this time he is a full code. All the records are reviewed and case discussed with Care Management/Social Workerr. Management plans discussed with the patient, family and they are in agreement.  CODE STATUS: Full code  TOTAL TIME TAKING CARE OF THIS PATIENT: 35 minutes.   POSSIBLE D/C IN 1-2DAYS, DEPENDING ON CLINICAL CONDITION.   Matthew Schultz M.D on 04/14/2018 at 12:17 PM  Between 7am to 6pm - Pager - [phone]  After 6pm go to [email or web address] - password [email or web address] [facility]  [place] [facility]  Office  [phone]  CC: Primary care physician;  Maryland Pink, MD

## 2018-04-14 NOTE — Progress Notes (Signed)
Patient remains confused. Attempted to get out bed x 3

## 2018-04-15 DIAGNOSIS — F0390 Unspecified dementia without behavioral disturbance: Secondary | ICD-10-CM | POA: Diagnosis not present

## 2018-04-15 DIAGNOSIS — E119 Type 2 diabetes mellitus without complications: Secondary | ICD-10-CM | POA: Diagnosis not present

## 2018-04-15 DIAGNOSIS — K810 Acute cholecystitis: Secondary | ICD-10-CM | POA: Diagnosis not present

## 2018-04-15 DIAGNOSIS — I679 Cerebrovascular disease, unspecified: Secondary | ICD-10-CM | POA: Diagnosis not present

## 2018-04-15 DIAGNOSIS — N400 Enlarged prostate without lower urinary tract symptoms: Secondary | ICD-10-CM | POA: Diagnosis not present

## 2018-04-15 DIAGNOSIS — Z7401 Bed confinement status: Secondary | ICD-10-CM | POA: Diagnosis not present

## 2018-04-15 LAB — HSV(HERPES SIMPLEX VRS) I + II AB-IGM: HSVI/II Comb IgM: 1.05 Ratio — ABNORMAL HIGH (ref 0.00–0.90)

## 2018-04-15 LAB — GLUCOSE, CAPILLARY
Glucose-Capillary: 126 mg/dL — ABNORMAL HIGH (ref 65–99)
Glucose-Capillary: 137 mg/dL — ABNORMAL HIGH (ref 65–99)

## 2018-04-15 LAB — HEPATITIS C VRS RNA DETECT BY PCR-QUAL: HEPATITIS C VRS RNA BY PCR-QUAL: NEGATIVE

## 2018-04-15 MED ORDER — AMOXICILLIN-POT CLAVULANATE 875-125 MG PO TABS: 1.0000 | ORAL_TABLET | Freq: Two times a day (BID) | ORAL | 0 refills | Status: AC

## 2018-04-15 MED ORDER — ENSURE ENLIVE PO LIQD: 237.0000 mL | Freq: Three times a day (TID) | ORAL | 12 refills | Status: AC

## 2018-04-15 MED ORDER — AMOXICILLIN-POT CLAVULANATE 875-125 MG PO TABS: 1.0000 | ORAL_TABLET | Freq: Two times a day (BID) | ORAL | Status: DC

## 2018-04-15 MED ORDER — POTASSIUM CHLORIDE CRYS ER 20 MEQ PO TBCR: 20.0000 meq | EXTENDED_RELEASE_TABLET | Freq: Once | ORAL | Status: DC

## 2018-04-15 NOTE — Discharge Summary (Signed)
[place] at [place] NAME: Matthew Schultz    MR#:  [id number]  DATE OF BIRTH:  [birth year]  DATE OF ADMISSION:  04/11/2018 ADMITTING PHYSICIAN: Amelia Jo, MD  DATE OF DISCHARGE: 04/15/2018  5:29 PM  PRIMARY CARE PHYSICIAN: Maryland Pink, MD   ADMISSION DIAGNOSIS:  1. Acute Cholecystitis 2.Abdominal pain 3. Benign Prostate Hypertrophy 4. Advanced Dementia 5.CAD 6. Diabetes Mellitus Type II 7. Hypertension 8. Hyperlipidemia 9. H/O CVA  DISCHARGE DIAGNOSIS:  1. Abdominal pain resolved 2.Cholecystitis Improved 3.Abnormal liver function tests improved 4.Protein energy malnutrition 5.Hyperbilirubinemia improved 6.choledocholithiasis 7.CAD 8. Diabetes Mellitus type II 9.Hyperlipidemia 10.Hypertension 11. Advanced Dementia 12.Thrombocytopenia (reactive)  SECONDARY DIAGNOSIS:   Past Medical History:  Diagnosis Date  . Arthritis   . BPH (benign prostatic hyperplasia)   . CAD (coronary artery disease)   . DM (diabetes mellitus) ([place])   . HLD (hyperlipidemia)   . HTN (hypertension)   . Hydronephrosis   . Incomplete bladder emptying   . OA (osteoarthritis)   . Renal mass   . Stroke [facility])      ADMITTING HISTORY HISTORY OF PRESENT ILLNESS: Matthew Schultz  is a 84 y.o. male with a known history of dementia, osteoarthritis, BPH, CAD, diabetes type 2, diet controlled, hypertension and other comorbidities.Patient is unable to provide history, due to confusion secondary to dementia.  Most of the information was taken from reviewing the medical records and from discussion with emergency room physician. Patient was brought to the hospital by the family for intermittent abdominal pain, going on for the past 2-3 days.  Per family, patient has been noted with worsening generalized weakness, confusion and falls in the past 2-3 days.  No fever, chills, nausea, vomiting, diarrhea noted.Blood test done in emergency room showed elevated WBC at  12.2 liver function tests are elevated.  AST is 148 ALT is 230.  Total bilirubin level is elevated at 5.3.Right upper quadrant ultrasound, reviewed by myself, shows gallbladder sludge with wall thickening and trace pericholecystic fluid, concerning for cholecystitis. Patient is admitted for further evaluation     HOSPITAL COURSE:  Patient admitted to medical floor and started on IV zosyn antibiotics.Liver function tests were closely followed.Patient was worked up with ultrasound abdomen, MR abdomen in the hospital. Patient was evaluated by surgery service, GI service in the hospital. Surgery deferred cholecystectomy and recommended percutaneous cholecystostomy drain if abdominal pain develops and elevated wbc count. Patients abdominal pain resolved. LFT improved. Lipase levels were normal.Patient also seen by psychiatry attending for dementia with behavioral problems.Patient tolerated diet well. Abdominal pain resolved. Follow up with surgery as out patient, along with Gastroenterology as out patient.Electrolytes were replaced.Long term prognosis poor considering multiple comorbid ites.Hepatotoxic drugs to be avoided.  CONSULTS OBTAINED:  Treatment Team:  Gonzella Lex, MD  Gastroenterology consult Surgery Consult  DRUG ALLERGIES:  No Known Allergies  DISCHARGE MEDICATIONS:   Allergies as of 04/15/2018   No Known Allergies     Medication List    STOP taking these medications   finasteride 5 MG tablet Commonly known as:  PROSCAR   simvastatin 40 MG tablet Commonly known as:  ZOCOR   traMADol 50 MG tablet Commonly known as:  ULTRAM     TAKE these medications   amoxicillin-clavulanate 875-125 MG tablet Commonly known as:  AUGMENTIN Take 1 tablet by mouth every 12 (twelve) hours for 5 days.   aspirin EC 81 MG tablet Take 81 mg by mouth daily.   donepezil 5 MG  tablet Commonly known as:  ARICEPT Take 1 tablet by mouth daily.   feeding supplement (ENSURE ENLIVE) Liqd Take  237 mLs by mouth 3 (three) times daily between meals.   gabapentin 300 MG capsule Commonly known as:  NEURONTIN Take 300 mg by mouth at bedtime.   metoprolol tartrate 25 MG tablet Commonly known as:  LOPRESSOR Take 1 tablet by mouth 2 (two) times daily.   omeprazole 20 MG capsule Commonly known as:  PRILOSEC Take 20 mg by mouth 2 (two) times daily.   tamsulosin 0.4 MG Caps capsule Commonly known as:  FLOMAX Take 1 capsule (0.4 mg total) by mouth daily.       Today  Patient seen and evaluated today Awake and responds to all verbal commands Has dementia  VITAL SIGNS:  Blood pressure 125/79, pulse 81, temperature 97.6 F (36.4 C), temperature source Oral, resp. rate 18, height 5\' 9"  (1.753 m), weight 60.3 kg (133 lb), SpO2 100 %.  I/O:    Intake/Output Summary (Last 24 hours) at 04/15/2018 1925 Last data filed at 04/15/2018 1001 Gross per 24 hour  Intake 120 ml  Output -  Net 120 ml    PHYSICAL EXAMINATION:  Physical Exam  GENERAL:  84 y.o.-year-old patient lying in the bed with no acute distress.  LUNGS: Normal breath sounds bilaterally, no wheezing, rales,rhonchi or crepitation. No use of accessory muscles of respiration.  CARDIOVASCULAR: S1, S2 normal. No murmurs, rubs, or gallops.  ABDOMEN: Soft, non-tender, non-distended. Bowel sounds present. No organomegaly or mass.  NEUROLOGIC: Moves all 4 extremities. PSYCHIATRIC: The patient is alert and oriented x 1 Has dementia  SKIN: No obvious rash, lesion, or ulcer.   DATA REVIEW:   CBC Recent Labs  Lab 04/12/18 0526  WBC 9.8  HGB 14.8  HCT 42.4  PLT 86*    Chemistries  Recent Labs  Lab 04/14/18 0406  NA 136  K 3.3*  CL 105  CO2 26  GLUCOSE 131*  BUN 18  CREATININE 0.77  CALCIUM 8.0*  MG 2.0  AST 44*  ALT 81*  ALKPHOS 208*  BILITOT 2.8*    Cardiac Enzymes No results for input(s): TROPONINI in the last 168 hours.  Microbiology Results  Results for orders placed or performed during the  hospital encounter of 04/11/18  MRSA PCR Screening     Status: None   Collection Time: 04/12/18  5:30 AM  Result Value Ref Range Status   MRSA by PCR NEGATIVE NEGATIVE Final    Comment:        The GeneXpert MRSA Assay (FDA approved for NASAL specimens only), is one component of a comprehensive MRSA colonization surveillance program. It is not intended to diagnose MRSA infection nor to guide or monitor treatment for MRSA infections. Performed at [facility], [address] [place], [place] [address]     RADIOLOGY:  No results found.  Follow up with PCP in 1 week.  Management plans discussed with the patient, family and they are in agreement.  CODE STATUS: Full code    Code Status Orders  (From admission, onward)        Start     Ordered   04/12/18 0457  Full code  Continuous     04/12/18 0456    Code Status History    Date Active Date Inactive Code Status Order ID Comments User Context   10/19/2016 2221 10/20/2016 1635 Full Code [id number]  Lance Coon, MD Inpatient      TOTAL TIME  TAKING CARE OF THIS PATIENT ON DAY OF DISCHARGE: more than 35 minutes.   Saundra Shelling M.D on 04/15/2018 at 7:25 PM  Between 7am to 6pm - Pager - [phone]  After 6pm go to [email or web address] - password [email or web address] [place] [facility]  Office  [phone]  CC: Primary care physician; Maryland Pink, MD  Note: This dictation was prepared with Dragon dictation along with smaller phrase technology. Any transcriptional errors that result from this process are unintentional.

## 2018-04-15 NOTE — Care Management Note (Signed)
Case Management Note  Patient Details  Name: JVION TURGEON MRN: [id number] Date of Birth: [birth year]  Subjective/Objective:                  RNCM spoke with patient's daughter [name] regarding transistion of care plans.  Patient is very sleepy today an "spacy" she says.  She states at baseline patient lives at home with his wife and son [name].  He typically is able to respond appropriately per [name] when asked if he wants anything.  She state he was ambulatory with either standard walker or rollator at home. He was able to feed himself but requires assistance three time per week with baths. They are paying someone privately for that assistance. She would like to have home health at discharge without preference.  She denies problems (at baseline) getting patient into a car or to see PCP Dr. Kary Kos. She denies problems with medications.  Action/Plan: Referral to [facility]   Expected Discharge Date:                  Expected Discharge Plan:     In-House Referral:     Discharge planning Services  CM Consult  Post Acute Care Choice:  Home Health Choice offered to:  Adult Children  DME Arranged:    DME Agency:     HH Arranged:  RN, PT, OT, Nurse's Aide, Speech Therapy, Social Work [employer] Agency:  [place]  Status of Service:  In process, will continue to follow  If discussed at Long Length of Stay Meetings, dates discussed:    Additional Comments:  Marshell Garfinkel, RN 04/15/2018, 12:45 PM

## 2018-04-15 NOTE — Progress Notes (Signed)
Pharmacy Antibiotic Note  Matthew Schultz is a 84 y.o. male admitted on 04/11/2018 with intra-abdominal infection.  Pharmacy has been consulted for zosyn dosing.  Plan: Continue Zosyn 3.375g IV q8h (4 hour infusion).  Height: 5\' 9"  (175.3 cm) Weight: 133 lb (60.3 kg) IBW/kg (Calculated) : 70.7  Temp (24hrs), Avg:98.1 F (36.7 C), Min:98.1 F (36.7 C), Max:98.1 F (36.7 C)  Recent Labs  Lab 04/11/18 1817 04/12/18 0526 04/12/18 1337 04/13/18 0420 04/14/18 0406  WBC 12.2* 9.8  --   --   --   CREATININE 1.07 1.02 1.04 0.75 0.77    Estimated Creatinine Clearance: 59.7 mL/min (by C-G formula based on SCr of 0.77 mg/dL).    No Known Allergies  Thank you for allowing pharmacy to be a part of this patient's care.  Pernell Dupre, PharmD, BCPS Clinical Pharmacist 04/15/2018 9:58 AM

## 2018-04-15 NOTE — Care Management (Signed)
RNCM notified by CSW that patient will need EMS to home. Daughter [name] agrees.  Packet completed. No other RNCM needs.

## 2018-04-15 NOTE — Care Management (Signed)
Matthew Schultz with [facility] notified of patient discharge to home today. No other RNCM needs.

## 2018-04-15 NOTE — Progress Notes (Signed)
EMS here to transport patient to home

## 2018-04-15 NOTE — Progress Notes (Signed)
Pt confused and impulsive during the night. Safety sitter ordered and mitts on to prevent pt from pulling out IV

## 2018-04-19 DIAGNOSIS — R945 Abnormal results of liver function studies: Secondary | ICD-10-CM | POA: Diagnosis not present

## 2018-04-19 DIAGNOSIS — R1011 Right upper quadrant pain: Secondary | ICD-10-CM | POA: Diagnosis not present

## 2018-04-20 DIAGNOSIS — F17220 Nicotine dependence, chewing tobacco, uncomplicated: Secondary | ICD-10-CM | POA: Diagnosis not present

## 2018-04-20 DIAGNOSIS — K810 Acute cholecystitis: Secondary | ICD-10-CM | POA: Diagnosis not present

## 2018-04-20 DIAGNOSIS — E119 Type 2 diabetes mellitus without complications: Secondary | ICD-10-CM | POA: Diagnosis not present

## 2018-04-20 DIAGNOSIS — I2510 Atherosclerotic heart disease of native coronary artery without angina pectoris: Secondary | ICD-10-CM | POA: Diagnosis not present

## 2018-04-20 DIAGNOSIS — M1991 Primary osteoarthritis, unspecified site: Secondary | ICD-10-CM | POA: Diagnosis not present

## 2018-04-20 DIAGNOSIS — N400 Enlarged prostate without lower urinary tract symptoms: Secondary | ICD-10-CM | POA: Diagnosis not present

## 2018-04-20 DIAGNOSIS — F0390 Unspecified dementia without behavioral disturbance: Secondary | ICD-10-CM | POA: Diagnosis not present

## 2018-04-20 DIAGNOSIS — I10 Essential (primary) hypertension: Secondary | ICD-10-CM | POA: Diagnosis not present

## 2018-04-20 DIAGNOSIS — R2689 Other abnormalities of gait and mobility: Secondary | ICD-10-CM | POA: Diagnosis not present

## 2018-04-22 DIAGNOSIS — F17220 Nicotine dependence, chewing tobacco, uncomplicated: Secondary | ICD-10-CM | POA: Diagnosis not present

## 2018-04-22 DIAGNOSIS — E119 Type 2 diabetes mellitus without complications: Secondary | ICD-10-CM | POA: Diagnosis not present

## 2018-04-22 DIAGNOSIS — M1991 Primary osteoarthritis, unspecified site: Secondary | ICD-10-CM | POA: Diagnosis not present

## 2018-04-22 DIAGNOSIS — F0390 Unspecified dementia without behavioral disturbance: Secondary | ICD-10-CM | POA: Diagnosis not present

## 2018-04-22 DIAGNOSIS — I10 Essential (primary) hypertension: Secondary | ICD-10-CM | POA: Diagnosis not present

## 2018-04-22 DIAGNOSIS — R2689 Other abnormalities of gait and mobility: Secondary | ICD-10-CM | POA: Diagnosis not present

## 2018-04-22 DIAGNOSIS — K810 Acute cholecystitis: Secondary | ICD-10-CM | POA: Diagnosis not present

## 2018-04-22 DIAGNOSIS — N400 Enlarged prostate without lower urinary tract symptoms: Secondary | ICD-10-CM | POA: Diagnosis not present

## 2018-04-22 DIAGNOSIS — I2510 Atherosclerotic heart disease of native coronary artery without angina pectoris: Secondary | ICD-10-CM | POA: Diagnosis not present

## 2018-04-26 DIAGNOSIS — R2689 Other abnormalities of gait and mobility: Secondary | ICD-10-CM | POA: Diagnosis not present

## 2018-04-26 DIAGNOSIS — I10 Essential (primary) hypertension: Secondary | ICD-10-CM | POA: Diagnosis not present

## 2018-04-26 DIAGNOSIS — N400 Enlarged prostate without lower urinary tract symptoms: Secondary | ICD-10-CM | POA: Diagnosis not present

## 2018-04-26 DIAGNOSIS — E119 Type 2 diabetes mellitus without complications: Secondary | ICD-10-CM | POA: Diagnosis not present

## 2018-04-26 DIAGNOSIS — F17220 Nicotine dependence, chewing tobacco, uncomplicated: Secondary | ICD-10-CM | POA: Diagnosis not present

## 2018-04-26 DIAGNOSIS — I2510 Atherosclerotic heart disease of native coronary artery without angina pectoris: Secondary | ICD-10-CM | POA: Diagnosis not present

## 2018-04-26 DIAGNOSIS — M1991 Primary osteoarthritis, unspecified site: Secondary | ICD-10-CM | POA: Diagnosis not present

## 2018-04-26 DIAGNOSIS — F0390 Unspecified dementia without behavioral disturbance: Secondary | ICD-10-CM | POA: Diagnosis not present

## 2018-04-26 DIAGNOSIS — K810 Acute cholecystitis: Secondary | ICD-10-CM | POA: Diagnosis not present

## 2018-04-27 DIAGNOSIS — F0390 Unspecified dementia without behavioral disturbance: Secondary | ICD-10-CM | POA: Diagnosis not present

## 2018-04-27 DIAGNOSIS — E119 Type 2 diabetes mellitus without complications: Secondary | ICD-10-CM | POA: Diagnosis not present

## 2018-04-27 DIAGNOSIS — I10 Essential (primary) hypertension: Secondary | ICD-10-CM | POA: Diagnosis not present

## 2018-04-27 DIAGNOSIS — N400 Enlarged prostate without lower urinary tract symptoms: Secondary | ICD-10-CM | POA: Diagnosis not present

## 2018-04-27 DIAGNOSIS — F17220 Nicotine dependence, chewing tobacco, uncomplicated: Secondary | ICD-10-CM | POA: Diagnosis not present

## 2018-04-27 DIAGNOSIS — K810 Acute cholecystitis: Secondary | ICD-10-CM | POA: Diagnosis not present

## 2018-04-27 DIAGNOSIS — R2689 Other abnormalities of gait and mobility: Secondary | ICD-10-CM | POA: Diagnosis not present

## 2018-04-27 DIAGNOSIS — I2510 Atherosclerotic heart disease of native coronary artery without angina pectoris: Secondary | ICD-10-CM | POA: Diagnosis not present

## 2018-04-27 DIAGNOSIS — M1991 Primary osteoarthritis, unspecified site: Secondary | ICD-10-CM | POA: Diagnosis not present

## 2018-04-28 DIAGNOSIS — R2689 Other abnormalities of gait and mobility: Secondary | ICD-10-CM | POA: Diagnosis not present

## 2018-04-28 DIAGNOSIS — I10 Essential (primary) hypertension: Secondary | ICD-10-CM | POA: Diagnosis not present

## 2018-04-28 DIAGNOSIS — F0390 Unspecified dementia without behavioral disturbance: Secondary | ICD-10-CM | POA: Diagnosis not present

## 2018-04-28 DIAGNOSIS — M1991 Primary osteoarthritis, unspecified site: Secondary | ICD-10-CM | POA: Diagnosis not present

## 2018-04-28 DIAGNOSIS — I2510 Atherosclerotic heart disease of native coronary artery without angina pectoris: Secondary | ICD-10-CM | POA: Diagnosis not present

## 2018-04-28 DIAGNOSIS — K810 Acute cholecystitis: Secondary | ICD-10-CM | POA: Diagnosis not present

## 2018-04-28 DIAGNOSIS — F17220 Nicotine dependence, chewing tobacco, uncomplicated: Secondary | ICD-10-CM | POA: Diagnosis not present

## 2018-04-28 DIAGNOSIS — N400 Enlarged prostate without lower urinary tract symptoms: Secondary | ICD-10-CM | POA: Diagnosis not present

## 2018-04-28 DIAGNOSIS — E119 Type 2 diabetes mellitus without complications: Secondary | ICD-10-CM | POA: Diagnosis not present

## 2018-05-02 DIAGNOSIS — I10 Essential (primary) hypertension: Secondary | ICD-10-CM | POA: Diagnosis not present

## 2018-05-02 DIAGNOSIS — R2689 Other abnormalities of gait and mobility: Secondary | ICD-10-CM | POA: Diagnosis not present

## 2018-05-02 DIAGNOSIS — M1991 Primary osteoarthritis, unspecified site: Secondary | ICD-10-CM | POA: Diagnosis not present

## 2018-05-02 DIAGNOSIS — F0390 Unspecified dementia without behavioral disturbance: Secondary | ICD-10-CM | POA: Diagnosis not present

## 2018-05-02 DIAGNOSIS — K810 Acute cholecystitis: Secondary | ICD-10-CM | POA: Diagnosis not present

## 2018-05-02 DIAGNOSIS — E119 Type 2 diabetes mellitus without complications: Secondary | ICD-10-CM | POA: Diagnosis not present

## 2018-05-02 DIAGNOSIS — F17220 Nicotine dependence, chewing tobacco, uncomplicated: Secondary | ICD-10-CM | POA: Diagnosis not present

## 2018-05-02 DIAGNOSIS — N400 Enlarged prostate without lower urinary tract symptoms: Secondary | ICD-10-CM | POA: Diagnosis not present

## 2018-05-02 DIAGNOSIS — I2510 Atherosclerotic heart disease of native coronary artery without angina pectoris: Secondary | ICD-10-CM | POA: Diagnosis not present

## 2018-05-03 DIAGNOSIS — E119 Type 2 diabetes mellitus without complications: Secondary | ICD-10-CM | POA: Diagnosis not present

## 2018-05-03 DIAGNOSIS — K810 Acute cholecystitis: Secondary | ICD-10-CM | POA: Diagnosis not present

## 2018-05-03 DIAGNOSIS — F17220 Nicotine dependence, chewing tobacco, uncomplicated: Secondary | ICD-10-CM | POA: Diagnosis not present

## 2018-05-03 DIAGNOSIS — N400 Enlarged prostate without lower urinary tract symptoms: Secondary | ICD-10-CM | POA: Diagnosis not present

## 2018-05-03 DIAGNOSIS — I2510 Atherosclerotic heart disease of native coronary artery without angina pectoris: Secondary | ICD-10-CM | POA: Diagnosis not present

## 2018-05-03 DIAGNOSIS — I10 Essential (primary) hypertension: Secondary | ICD-10-CM | POA: Diagnosis not present

## 2018-05-03 DIAGNOSIS — M1991 Primary osteoarthritis, unspecified site: Secondary | ICD-10-CM | POA: Diagnosis not present

## 2018-05-03 DIAGNOSIS — R2689 Other abnormalities of gait and mobility: Secondary | ICD-10-CM | POA: Diagnosis not present

## 2018-05-03 DIAGNOSIS — F0390 Unspecified dementia without behavioral disturbance: Secondary | ICD-10-CM | POA: Diagnosis not present

## 2018-05-04 DIAGNOSIS — F17220 Nicotine dependence, chewing tobacco, uncomplicated: Secondary | ICD-10-CM | POA: Diagnosis not present

## 2018-05-04 DIAGNOSIS — N400 Enlarged prostate without lower urinary tract symptoms: Secondary | ICD-10-CM | POA: Diagnosis not present

## 2018-05-04 DIAGNOSIS — M1991 Primary osteoarthritis, unspecified site: Secondary | ICD-10-CM | POA: Diagnosis not present

## 2018-05-04 DIAGNOSIS — I10 Essential (primary) hypertension: Secondary | ICD-10-CM | POA: Diagnosis not present

## 2018-05-04 DIAGNOSIS — R2689 Other abnormalities of gait and mobility: Secondary | ICD-10-CM | POA: Diagnosis not present

## 2018-05-04 DIAGNOSIS — I2510 Atherosclerotic heart disease of native coronary artery without angina pectoris: Secondary | ICD-10-CM | POA: Diagnosis not present

## 2018-05-04 DIAGNOSIS — K810 Acute cholecystitis: Secondary | ICD-10-CM | POA: Diagnosis not present

## 2018-05-04 DIAGNOSIS — E119 Type 2 diabetes mellitus without complications: Secondary | ICD-10-CM | POA: Diagnosis not present

## 2018-05-04 DIAGNOSIS — F0390 Unspecified dementia without behavioral disturbance: Secondary | ICD-10-CM | POA: Diagnosis not present

## 2018-05-05 DIAGNOSIS — R2689 Other abnormalities of gait and mobility: Secondary | ICD-10-CM | POA: Diagnosis not present

## 2018-05-05 DIAGNOSIS — F0390 Unspecified dementia without behavioral disturbance: Secondary | ICD-10-CM | POA: Diagnosis not present

## 2018-05-05 DIAGNOSIS — F17220 Nicotine dependence, chewing tobacco, uncomplicated: Secondary | ICD-10-CM | POA: Diagnosis not present

## 2018-05-05 DIAGNOSIS — N400 Enlarged prostate without lower urinary tract symptoms: Secondary | ICD-10-CM | POA: Diagnosis not present

## 2018-05-05 DIAGNOSIS — I2510 Atherosclerotic heart disease of native coronary artery without angina pectoris: Secondary | ICD-10-CM | POA: Diagnosis not present

## 2018-05-05 DIAGNOSIS — K810 Acute cholecystitis: Secondary | ICD-10-CM | POA: Diagnosis not present

## 2018-05-05 DIAGNOSIS — M1991 Primary osteoarthritis, unspecified site: Secondary | ICD-10-CM | POA: Diagnosis not present

## 2018-05-05 DIAGNOSIS — E119 Type 2 diabetes mellitus without complications: Secondary | ICD-10-CM | POA: Diagnosis not present

## 2018-05-05 DIAGNOSIS — I10 Essential (primary) hypertension: Secondary | ICD-10-CM | POA: Diagnosis not present

## 2018-05-06 ENCOUNTER — Other Ambulatory Visit: Payer: Self-pay

## 2018-05-06 DIAGNOSIS — R894 Abnormal immunological findings in specimens from other organs, systems and tissues: Secondary | ICD-10-CM

## 2018-05-09 DIAGNOSIS — F0390 Unspecified dementia without behavioral disturbance: Secondary | ICD-10-CM | POA: Diagnosis not present

## 2018-05-09 DIAGNOSIS — I10 Essential (primary) hypertension: Secondary | ICD-10-CM | POA: Diagnosis not present

## 2018-05-09 DIAGNOSIS — F17220 Nicotine dependence, chewing tobacco, uncomplicated: Secondary | ICD-10-CM | POA: Diagnosis not present

## 2018-05-09 DIAGNOSIS — M1991 Primary osteoarthritis, unspecified site: Secondary | ICD-10-CM | POA: Diagnosis not present

## 2018-05-09 DIAGNOSIS — E119 Type 2 diabetes mellitus without complications: Secondary | ICD-10-CM | POA: Diagnosis not present

## 2018-05-09 DIAGNOSIS — R2689 Other abnormalities of gait and mobility: Secondary | ICD-10-CM | POA: Diagnosis not present

## 2018-05-09 DIAGNOSIS — I2510 Atherosclerotic heart disease of native coronary artery without angina pectoris: Secondary | ICD-10-CM | POA: Diagnosis not present

## 2018-05-09 DIAGNOSIS — K810 Acute cholecystitis: Secondary | ICD-10-CM | POA: Diagnosis not present

## 2018-05-09 DIAGNOSIS — N400 Enlarged prostate without lower urinary tract symptoms: Secondary | ICD-10-CM | POA: Diagnosis not present

## 2018-05-10 DIAGNOSIS — I2510 Atherosclerotic heart disease of native coronary artery without angina pectoris: Secondary | ICD-10-CM | POA: Diagnosis not present

## 2018-05-10 DIAGNOSIS — E119 Type 2 diabetes mellitus without complications: Secondary | ICD-10-CM | POA: Diagnosis not present

## 2018-05-10 DIAGNOSIS — I10 Essential (primary) hypertension: Secondary | ICD-10-CM | POA: Diagnosis not present

## 2018-05-10 DIAGNOSIS — F17220 Nicotine dependence, chewing tobacco, uncomplicated: Secondary | ICD-10-CM | POA: Diagnosis not present

## 2018-05-10 DIAGNOSIS — K810 Acute cholecystitis: Secondary | ICD-10-CM | POA: Diagnosis not present

## 2018-05-10 DIAGNOSIS — M1991 Primary osteoarthritis, unspecified site: Secondary | ICD-10-CM | POA: Diagnosis not present

## 2018-05-10 DIAGNOSIS — R2689 Other abnormalities of gait and mobility: Secondary | ICD-10-CM | POA: Diagnosis not present

## 2018-05-10 DIAGNOSIS — N400 Enlarged prostate without lower urinary tract symptoms: Secondary | ICD-10-CM | POA: Diagnosis not present

## 2018-05-10 DIAGNOSIS — F0390 Unspecified dementia without behavioral disturbance: Secondary | ICD-10-CM | POA: Diagnosis not present

## 2018-05-11 DIAGNOSIS — F17220 Nicotine dependence, chewing tobacco, uncomplicated: Secondary | ICD-10-CM | POA: Diagnosis not present

## 2018-05-11 DIAGNOSIS — E119 Type 2 diabetes mellitus without complications: Secondary | ICD-10-CM | POA: Diagnosis not present

## 2018-05-11 DIAGNOSIS — F0390 Unspecified dementia without behavioral disturbance: Secondary | ICD-10-CM | POA: Diagnosis not present

## 2018-05-11 DIAGNOSIS — I2510 Atherosclerotic heart disease of native coronary artery without angina pectoris: Secondary | ICD-10-CM | POA: Diagnosis not present

## 2018-05-11 DIAGNOSIS — R2689 Other abnormalities of gait and mobility: Secondary | ICD-10-CM | POA: Diagnosis not present

## 2018-05-11 DIAGNOSIS — I10 Essential (primary) hypertension: Secondary | ICD-10-CM | POA: Diagnosis not present

## 2018-05-11 DIAGNOSIS — M1991 Primary osteoarthritis, unspecified site: Secondary | ICD-10-CM | POA: Diagnosis not present

## 2018-05-11 DIAGNOSIS — K810 Acute cholecystitis: Secondary | ICD-10-CM | POA: Diagnosis not present

## 2018-05-11 DIAGNOSIS — N400 Enlarged prostate without lower urinary tract symptoms: Secondary | ICD-10-CM | POA: Diagnosis not present

## 2018-05-17 DIAGNOSIS — F0390 Unspecified dementia without behavioral disturbance: Secondary | ICD-10-CM | POA: Diagnosis not present

## 2018-05-17 DIAGNOSIS — K810 Acute cholecystitis: Secondary | ICD-10-CM | POA: Diagnosis not present

## 2018-05-17 DIAGNOSIS — F17220 Nicotine dependence, chewing tobacco, uncomplicated: Secondary | ICD-10-CM | POA: Diagnosis not present

## 2018-05-17 DIAGNOSIS — I2510 Atherosclerotic heart disease of native coronary artery without angina pectoris: Secondary | ICD-10-CM | POA: Diagnosis not present

## 2018-05-17 DIAGNOSIS — R2689 Other abnormalities of gait and mobility: Secondary | ICD-10-CM | POA: Diagnosis not present

## 2018-05-17 DIAGNOSIS — E119 Type 2 diabetes mellitus without complications: Secondary | ICD-10-CM | POA: Diagnosis not present

## 2018-05-17 DIAGNOSIS — M1991 Primary osteoarthritis, unspecified site: Secondary | ICD-10-CM | POA: Diagnosis not present

## 2018-05-17 DIAGNOSIS — I10 Essential (primary) hypertension: Secondary | ICD-10-CM | POA: Diagnosis not present

## 2018-05-17 DIAGNOSIS — N400 Enlarged prostate without lower urinary tract symptoms: Secondary | ICD-10-CM | POA: Diagnosis not present

## 2018-09-25 ENCOUNTER — Inpatient Hospital Stay (HOSPITAL_COMMUNITY)
Admission: AD | Admit: 2018-09-25 | Discharge: 2018-10-05 | DRG: 871 | Disposition: A | Discharge status: Skilled Nursing Facility | Payer: Medicare HMO | Source: Other Acute Inpatient Hospital | Attending: Internal Medicine | Admitting: Internal Medicine

## 2018-09-25 ENCOUNTER — Emergency Department: Payer: Medicare HMO

## 2018-09-25 ENCOUNTER — Encounter: Payer: Self-pay | Admitting: Radiology

## 2018-09-25 ENCOUNTER — Emergency Department
Admission: EM | Admit: 2018-09-25 | Discharge: 2018-09-25 | Disposition: A | Discharge status: Other Acute Inpatient Hospital | Payer: Medicare HMO | Attending: Emergency Medicine | Admitting: Emergency Medicine

## 2018-09-25 DIAGNOSIS — K219 Gastro-esophageal reflux disease without esophagitis: Secondary | ICD-10-CM | POA: Diagnosis present

## 2018-09-25 DIAGNOSIS — Z6824 Body mass index (BMI) 24.0-24.9, adult: Secondary | ICD-10-CM | POA: Diagnosis not present

## 2018-09-25 DIAGNOSIS — Z8673 Personal history of transient ischemic attack (TIA), and cerebral infarction without residual deficits: Secondary | ICD-10-CM | POA: Insufficient documentation

## 2018-09-25 DIAGNOSIS — K759 Inflammatory liver disease, unspecified: Secondary | ICD-10-CM | POA: Diagnosis not present

## 2018-09-25 DIAGNOSIS — D3501 Benign neoplasm of right adrenal gland: Secondary | ICD-10-CM | POA: Diagnosis present

## 2018-09-25 DIAGNOSIS — E119 Type 2 diabetes mellitus without complications: Secondary | ICD-10-CM | POA: Insufficient documentation

## 2018-09-25 DIAGNOSIS — B952 Enterococcus as the cause of diseases classified elsewhere: Secondary | ICD-10-CM | POA: Diagnosis not present

## 2018-09-25 DIAGNOSIS — N131 Hydronephrosis with ureteral stricture, not elsewhere classified: Secondary | ICD-10-CM | POA: Diagnosis not present

## 2018-09-25 DIAGNOSIS — R7881 Bacteremia: Secondary | ICD-10-CM | POA: Diagnosis not present

## 2018-09-25 DIAGNOSIS — I10 Essential (primary) hypertension: Secondary | ICD-10-CM | POA: Diagnosis present

## 2018-09-25 DIAGNOSIS — E785 Hyperlipidemia, unspecified: Secondary | ICD-10-CM | POA: Diagnosis present

## 2018-09-25 DIAGNOSIS — E872 Acidosis: Secondary | ICD-10-CM | POA: Diagnosis present

## 2018-09-25 DIAGNOSIS — I2510 Atherosclerotic heart disease of native coronary artery without angina pectoris: Secondary | ICD-10-CM | POA: Diagnosis not present

## 2018-09-25 DIAGNOSIS — R531 Weakness: Secondary | ICD-10-CM | POA: Diagnosis not present

## 2018-09-25 DIAGNOSIS — N179 Acute kidney failure, unspecified: Secondary | ICD-10-CM | POA: Diagnosis not present

## 2018-09-25 DIAGNOSIS — R339 Retention of urine, unspecified: Secondary | ICD-10-CM | POA: Diagnosis not present

## 2018-09-25 DIAGNOSIS — M2550 Pain in unspecified joint: Secondary | ICD-10-CM | POA: Diagnosis not present

## 2018-09-25 DIAGNOSIS — E440 Moderate protein-calorie malnutrition: Secondary | ICD-10-CM | POA: Diagnosis not present

## 2018-09-25 DIAGNOSIS — R05 Cough: Secondary | ICD-10-CM | POA: Diagnosis not present

## 2018-09-25 DIAGNOSIS — E876 Hypokalemia: Secondary | ICD-10-CM | POA: Diagnosis present

## 2018-09-25 DIAGNOSIS — R1310 Dysphagia, unspecified: Secondary | ICD-10-CM | POA: Diagnosis not present

## 2018-09-25 DIAGNOSIS — K8309 Other cholangitis: Secondary | ICD-10-CM | POA: Diagnosis not present

## 2018-09-25 DIAGNOSIS — R7989 Other specified abnormal findings of blood chemistry: Secondary | ICD-10-CM

## 2018-09-25 DIAGNOSIS — I5030 Unspecified diastolic (congestive) heart failure: Secondary | ICD-10-CM | POA: Diagnosis not present

## 2018-09-25 DIAGNOSIS — F0390 Unspecified dementia without behavioral disturbance: Secondary | ICD-10-CM | POA: Diagnosis present

## 2018-09-25 DIAGNOSIS — J690 Pneumonitis due to inhalation of food and vomit: Secondary | ICD-10-CM | POA: Diagnosis not present

## 2018-09-25 DIAGNOSIS — A4151 Sepsis due to Escherichia coli [E. coli]: Secondary | ICD-10-CM | POA: Diagnosis not present

## 2018-09-25 DIAGNOSIS — K8050 Calculus of bile duct without cholangitis or cholecystitis without obstruction: Secondary | ICD-10-CM | POA: Diagnosis not present

## 2018-09-25 DIAGNOSIS — E860 Dehydration: Secondary | ICD-10-CM | POA: Diagnosis not present

## 2018-09-25 DIAGNOSIS — G459 Transient cerebral ischemic attack, unspecified: Secondary | ICD-10-CM | POA: Diagnosis not present

## 2018-09-25 DIAGNOSIS — B9620 Unspecified Escherichia coli [E. coli] as the cause of diseases classified elsewhere: Secondary | ICD-10-CM | POA: Diagnosis not present

## 2018-09-25 DIAGNOSIS — Z7401 Bed confinement status: Secondary | ICD-10-CM | POA: Diagnosis not present

## 2018-09-25 DIAGNOSIS — R451 Restlessness and agitation: Secondary | ICD-10-CM | POA: Diagnosis not present

## 2018-09-25 DIAGNOSIS — R748 Abnormal levels of other serum enzymes: Secondary | ICD-10-CM | POA: Diagnosis present

## 2018-09-25 DIAGNOSIS — Z79899 Other long term (current) drug therapy: Secondary | ICD-10-CM

## 2018-09-25 DIAGNOSIS — K8051 Calculus of bile duct without cholangitis or cholecystitis with obstruction: Secondary | ICD-10-CM | POA: Diagnosis not present

## 2018-09-25 DIAGNOSIS — Z7982 Long term (current) use of aspirin: Secondary | ICD-10-CM

## 2018-09-25 DIAGNOSIS — R945 Abnormal results of liver function studies: Secondary | ICD-10-CM | POA: Insufficient documentation

## 2018-09-25 DIAGNOSIS — K839 Disease of biliary tract, unspecified: Secondary | ICD-10-CM | POA: Diagnosis not present

## 2018-09-25 DIAGNOSIS — Z9889 Other specified postprocedural states: Secondary | ICD-10-CM | POA: Diagnosis not present

## 2018-09-25 DIAGNOSIS — R1312 Dysphagia, oropharyngeal phase: Secondary | ICD-10-CM | POA: Diagnosis present

## 2018-09-25 DIAGNOSIS — R740 Nonspecific elevation of levels of transaminase and lactic acid dehydrogenase [LDH]: Secondary | ICD-10-CM | POA: Diagnosis not present

## 2018-09-25 DIAGNOSIS — K8030 Calculus of bile duct with cholangitis, unspecified, without obstruction: Secondary | ICD-10-CM | POA: Diagnosis not present

## 2018-09-25 DIAGNOSIS — Z87891 Personal history of nicotine dependence: Secondary | ICD-10-CM | POA: Diagnosis not present

## 2018-09-25 DIAGNOSIS — E1140 Type 2 diabetes mellitus with diabetic neuropathy, unspecified: Secondary | ICD-10-CM | POA: Diagnosis not present

## 2018-09-25 DIAGNOSIS — R932 Abnormal findings on diagnostic imaging of liver and biliary tract: Secondary | ICD-10-CM | POA: Diagnosis not present

## 2018-09-25 DIAGNOSIS — R4020 Unspecified coma: Secondary | ICD-10-CM | POA: Diagnosis not present

## 2018-09-25 DIAGNOSIS — R079 Chest pain, unspecified: Secondary | ICD-10-CM | POA: Diagnosis present

## 2018-09-25 DIAGNOSIS — F05 Delirium due to known physiological condition: Secondary | ICD-10-CM | POA: Diagnosis not present

## 2018-09-25 DIAGNOSIS — N400 Enlarged prostate without lower urinary tract symptoms: Secondary | ICD-10-CM | POA: Diagnosis present

## 2018-09-25 DIAGNOSIS — K8042 Calculus of bile duct with acute cholecystitis without obstruction: Secondary | ICD-10-CM | POA: Diagnosis not present

## 2018-09-25 DIAGNOSIS — M1990 Unspecified osteoarthritis, unspecified site: Secondary | ICD-10-CM | POA: Diagnosis present

## 2018-09-25 DIAGNOSIS — R6520 Severe sepsis without septic shock: Secondary | ICD-10-CM | POA: Diagnosis not present

## 2018-09-25 DIAGNOSIS — R4182 Altered mental status, unspecified: Secondary | ICD-10-CM | POA: Diagnosis not present

## 2018-09-25 DIAGNOSIS — F17220 Nicotine dependence, chewing tobacco, uncomplicated: Secondary | ICD-10-CM | POA: Diagnosis not present

## 2018-09-25 DIAGNOSIS — N170 Acute kidney failure with tubular necrosis: Secondary | ICD-10-CM | POA: Diagnosis not present

## 2018-09-25 DIAGNOSIS — R918 Other nonspecific abnormal finding of lung field: Secondary | ICD-10-CM | POA: Diagnosis not present

## 2018-09-25 DIAGNOSIS — K831 Obstruction of bile duct: Secondary | ICD-10-CM | POA: Diagnosis not present

## 2018-09-25 LAB — COMPREHENSIVE METABOLIC PANEL
ALT: 319 U/L — ABNORMAL HIGH (ref 0–44)
ANION GAP: 15 (ref 5–15)
AST: 538 U/L — AB (ref 15–41)
Albumin: 3.5 g/dL (ref 3.5–5.0)
Alkaline Phosphatase: 208 U/L — ABNORMAL HIGH (ref 38–126)
BUN: 29 mg/dL — ABNORMAL HIGH (ref 8–23)
CALCIUM: 8.6 mg/dL — AB (ref 8.9–10.3)
CO2: 21 mmol/L — ABNORMAL LOW (ref 22–32)
CREATININE: 1.28 mg/dL — AB (ref 0.61–1.24)
Chloride: 104 mmol/L (ref 98–111)
GFR, EST AFRICAN AMERICAN: 58 mL/min — AB (ref >60)
GFR, EST NON AFRICAN AMERICAN: 50 mL/min — AB (ref >60)
GLUCOSE: 219 mg/dL — AB (ref 70–99)
Potassium: 3.5 mmol/L (ref 3.5–5.1)
Sodium: 140 mmol/L (ref 135–145)
Total Bilirubin: 5.2 mg/dL — ABNORMAL HIGH (ref 0.3–1.2)
Total Protein: 7.1 g/dL (ref 6.5–8.1)

## 2018-09-25 LAB — CBC WITH DIFFERENTIAL/PLATELET
BASOS PCT: 0 %
Basophils Absolute: 0 10*3/uL (ref 0–0.1)
EOS ABS: 0 10*3/uL (ref 0–0.7)
EOS PCT: 0 %
HCT: 38.1 % — ABNORMAL LOW (ref 40.0–52.0)
HEMOGLOBIN: 13.4 g/dL (ref 13.0–18.0)
Lymphocytes Relative: 2 %
Lymphs Abs: 0.4 10*3/uL — ABNORMAL LOW (ref 1.0–3.6)
MCH: 31.5 pg (ref 26.0–34.0)
MCHC: 35.3 g/dL (ref 32.0–36.0)
MCV: 89.1 fL (ref 80.0–100.0)
MONOS PCT: 5 %
Monocytes Absolute: 1.1 10*3/uL — ABNORMAL HIGH (ref 0.2–1.0)
NEUTROS PCT: 93 %
Neutro Abs: 18.8 10*3/uL — ABNORMAL HIGH (ref 1.4–6.5)
PLATELETS: 178 10*3/uL (ref 150–440)
RBC: 4.27 MIL/uL — ABNORMAL LOW (ref 4.40–5.90)
RDW: 14.1 % (ref 11.5–14.5)
WBC: 20.3 10*3/uL — AB (ref 3.8–10.6)

## 2018-09-25 LAB — URINALYSIS, COMPLETE (UACMP) WITH MICROSCOPIC
Bacteria, UA: NONE SEEN
GLUCOSE, UA: 50 mg/dL — AB
Hgb urine dipstick: NEGATIVE
KETONES UR: NEGATIVE mg/dL
LEUKOCYTES UA: NEGATIVE
Nitrite: NEGATIVE
PROTEIN: 30 mg/dL — AB
Specific Gravity, Urine: 1.019 (ref 1.005–1.030)
pH: 6 (ref 5.0–8.0)

## 2018-09-25 LAB — GLUCOSE, CAPILLARY: Glucose-Capillary: 140 mg/dL — ABNORMAL HIGH (ref 70–99)

## 2018-09-25 LAB — TROPONIN I: Troponin I: <0.03 ng/mL (ref <0.03)

## 2018-09-25 LAB — LACTIC ACID, PLASMA
LACTIC ACID, VENOUS: 5.1 mmol/L — AB (ref 0.5–1.9)
LACTIC ACID, VENOUS: 4.1 mmol/L — AB (ref 0.5–1.9)

## 2018-09-25 MED ORDER — PIPERACILLIN-TAZOBACTAM 3.375 G IVPB 30 MIN
3.3750 g | Freq: Once | INTRAVENOUS | Status: AC
  Administered 2018-09-25: 3.375 g via INTRAVENOUS
  Filled 2018-09-25: qty 50

## 2018-09-25 MED ORDER — FENTANYL CITRATE (PF) 100 MCG/2ML IJ SOLN
50.0000 ug | Freq: Once | INTRAMUSCULAR | Status: AC
  Administered 2018-09-25: 50 ug via INTRAVENOUS
  Filled 2018-09-25: qty 2

## 2018-09-25 MED ORDER — SODIUM CHLORIDE 0.9 % IV BOLUS
1000.0000 mL | Freq: Once | INTRAVENOUS | Status: AC
  Administered 2018-09-25: 1000 mL via INTRAVENOUS

## 2018-09-25 MED ORDER — VANCOMYCIN HCL IN DEXTROSE 1-5 GM/200ML-% IV SOLN
1000.0000 mg | Freq: Once | INTRAVENOUS | Status: AC
  Administered 2018-09-25: 1000 mg via INTRAVENOUS
  Filled 2018-09-25: qty 200

## 2018-09-25 MED ORDER — IOPAMIDOL (ISOVUE-300) INJECTION 61%
100.0000 mL | Freq: Once | INTRAVENOUS | Status: AC | PRN
  Administered 2018-09-25: 100 mL via INTRAVENOUS

## 2018-09-25 NOTE — ED Triage Notes (Signed)
Pt arrived from home via from [facility]. Per EMS pt's bother called EMS and reported he found pt laying on the floor and confusedf. Per EMS family/brother was unable to provide PMH. VSS. NAD noted.

## 2018-09-25 NOTE — ED Provider Notes (Signed)
Patient's ultrasound showed some mild gallbladder wall thickening.  CT was performed which was concerning for early ductal lithiasis.  However given patient's fever I do have concerns for cholangitis.  Discussed with GI at [facility] and will plan on transfer to the ICU.   Nance Pear, MD 09/25/18 2043

## 2018-09-25 NOTE — ED Notes (Signed)
EMTALA reviewed. 

## 2018-09-25 NOTE — ED Notes (Signed)
ED Provider at bedside. 

## 2018-09-25 NOTE — ED Notes (Signed)
Report to [place], RN with [facility].

## 2018-09-25 NOTE — Clinical Social Work Note (Signed)
Clinical Social Work Assessment  Patient Details  Name: Matthew Schultz MRN: [id number] Date of Birth: 19-Oct-1932  Date of referral:  09/25/18               Reason for consult:  Discharge Planning, Other (Comment Required)(Poor Historian/Indogenous ?)                Permission sought to share information with:  Family Supports Permission granted to share information::  Yes, Verbal Permission Granted  Name::     Matthew Schultz Schultz [phone] ( wife) Daughter Matthew Schultz [phone]  Agency::     Relationship::     Contact Information:     Housing/Transportation Living arrangements for the past 2 months:  [place] of Information:  Medical Team, Adult Children Patient Interpreter Needed:  None Criminal Activity/Legal Involvement Pertinent to Current Situation/Hospitalization:  No - Comment as needed Significant Relationships:  Adult Children, Spouse, Dependent Children Lives with:    Do you feel safe going back to the place where you live?  Yes Need for family participation in patient care:  Yes (Comment)  Care giving concerns:  Daughter reports father was in bedroom and had a fall and her brother and mother called EMS   Social Worker assessment / plan:  LCSW introduced myself to patient and asked if he was married or had children. He did not respond. He was able to tell me his birthday and seemed calm and oriented x1. Patient did give consent for me to speak to his daughters. Called Matthew Schultz and patient is married to Matthew Schultz and has 1 son Matthew Schultz ( Learning disabled) who resides with his mother and father. He does the cooking and cleaning. The daughters have shifts of taking care of family members. Matthew Schultz goes daily to help with outings/food while her sister goes out evening and mostly weekends. Patient was soiled and daughter reports she had to work today so she didn't get to change or shave him. Patient was pleasant but unable to answer questions. Patient does have a walker but  never uses it, he is not diabetic, he is incontinent, he is a poor historian but on some days his memory is intact and others not so good. Patient is able to return home. ( Henry stated the house was dirty) LCSW encourages all EMS,Police, relatives to report if they feel environmental issues in a persons residence is hazardous,  They are to report what they see to APS. Consulted EDP and patient is to be admitted-and can return back home or as advised by Doctors.  Employment status:  Retired Lawyer) PT Recommendations:   TBD Information / Referral to community resources:   TBD  Patient/Family's Response to care:  Glad he is being admitted  Patient/Family's Understanding of and Emotional Response to Diagnosis, Current Treatment, and Prognosis:  Patient has some understanding he has pain and reports it comes and goes  Emotional Assessment Appearance:  Appears stated age Attitude/Demeanor/Rapport:  Lethargic, Unable to Assess(Dementia) Affect (typically observed):  Calm Orientation:  Oriented to Self Alcohol / Substance use:  Not Applicable Psych involvement (Current and /or in the community):  No (Comment)  Discharge Needs  Concerns to be addressed:  Care Coordination Readmission within the last 30 days:  No Current discharge risk:  None Barriers to Discharge:  Continued Medical Work up   [place], [place], LCSW 09/25/2018, 3:27 PM

## 2018-09-25 NOTE — ED Provider Notes (Signed)
[facility] Emergency Department Provider Note ____________________________________________   I have reviewed the triage vital signs and the triage nursing note.  HISTORY  Chief Complaint Altered Mental Status   Historian Level 5 Caveat History Limited by poor historian  HPI Matthew Schultz is a 84 y.o. male presents by EMS from home where he apparently lives alone, smelling of urine, apparently family had found him with decreased alertness/confusion.  EMS reported really kept area, unclear that patient is really safe taking care of himself right now.  Patient states he has chest pain occasionally.  States he has some abdominal pain right now although he cannot really give me anything for that.  He is denying headache.  He is moving for extremity's.   EMS reported heart rate from 40s to 250s.    Past Medical History:  Diagnosis Date  . Arthritis   . BPH (benign prostatic hyperplasia)   . CAD (coronary artery disease)   . DM (diabetes mellitus) ([place])   . HLD (hyperlipidemia)   . HTN (hypertension)   . Hydronephrosis   . Incomplete bladder emptying   . OA (osteoarthritis)   . Renal mass   . Stroke [facility])     Patient Active Problem List   Diagnosis Date Noted  . Hyperbilirubinemia   . Protein-calorie malnutrition, severe 04/14/2018  . Dementia with behavioral disturbance 04/14/2018  . Acute cholecystitis 04/12/2018  . Choledocholithiasis with acute cholecystitis   . Syncope 10/19/2016  . AKI (acute kidney injury) ([place]) 10/19/2016  . Cataract 09/07/2016  . Coronary artery disease 09/07/2016  . Diabetes ([place]) 09/07/2016  . HTN (hypertension) 09/07/2016  . Hyperlipidemia 09/07/2016  . Osteoarthritis 09/07/2016  . Stroke ([place]) 09/07/2016  . DDD (degenerative disc disease), lumbar 08/23/2014  . Lumbar radiculitis 08/23/2014  . Low back pain 06/01/2014  . Lumbar radiculopathy 06/01/2014    Past Surgical History:  Procedure Laterality  Date  . BACK SURGERY      Prior to Admission medications   Medication Sig Start Date End Date Taking? Authorizing Provider  aspirin EC 81 MG tablet Take 81 mg by mouth daily.    [provider]  donepezil (ARICEPT) 5 MG tablet Take 1 tablet by mouth daily. 04/06/18   [provider]  feeding supplement, ENSURE ENLIVE, (ENSURE ENLIVE) LIQD Take 237 mLs by mouth 3 (three) times daily between meals. 04/15/18   Saundra Shelling, MD  gabapentin (NEURONTIN) 300 MG capsule Take 300 mg by mouth at bedtime.     [provider]  metoprolol tartrate (LOPRESSOR) 25 MG tablet Take 1 tablet by mouth 2 (two) times daily. 03/28/18   [provider]  omeprazole (PRILOSEC) 20 MG capsule Take 20 mg by mouth 2 (two) times daily.    [provider]  tamsulosin (FLOMAX) 0.4 MG CAPS capsule Take 1 capsule (0.4 mg total) by mouth daily. 09/07/16   Zara Council A, PA-C    No Known Allergies  Family History  Problem Relation Age of Onset  . Pneumonia Father   . Kidney cancer Neg Hx   . Kidney disease Neg Hx   . Prostate cancer Neg Hx     Social History Social History   Tobacco Use  . Smoking status: Former Smoker    Types: Cigarettes  . Smokeless tobacco: Current User    Types: Chew  Substance Use Topics  . Alcohol use: No  . Drug use: No    Review of Systems Limited but with the patient's answers Constitutional:  Negative for fever. Eyes: Negative for visual changes. ENT: Negative for sore throat. Cardiovascular: Positive for occasional chest pain. Respiratory: Negative for shortness of breath. Gastrointestinal: Denies vomiting conservation or diarrhea.. Genitourinary: As of urine, not complaining of dysuria. Musculoskeletal: Negative for back pain. Skin: Negative for rash. Neurological: Negative for headache.  ____________________________________________   PHYSICAL EXAM:  VITAL SIGNS: ED Triage Vitals [09/25/18 1357]  Enc Vitals Group     BP  108/66     Pulse Rate 77     Resp 16     Temp 98.3 F (36.8 C)     Temp Source Oral     SpO2 98 %     Weight      Height      Head Circumference      Peak Flow      Pain Score      Pain Loc      Pain Edu?      Excl. in [place]?      Constitutional: Alert and active, poor historian, disoriented.  Unkempt, dirt under nails and caked on skin.  Smells of urine. HEENT      Head: Normocephalic and atraumatic.      Eyes: Conjunctivae are normal. Pupils equal and round.  Right eye turned out.      Ears:         Nose: No congestion/rhinnorhea.      Mouth/Throat: Mucous membranes are mildly dry.      Neck: No stridor. Cardiovascular/Chest: Normal rate, regular rhythm.  No murmurs, rubs, or gallops. Respiratory: Normal respiratory effort without tachypnea nor retractions. Breath sounds are clear and equal bilaterally. No wheezes/rales/rhonchi. Gastrointestinal: Soft. No distention, no guarding, no rebound.  Mild tenderness without organomegaly.  No focal point tenderness.    Genitourinary/rectal:Deferred Musculoskeletal: Nontender with normal range of motion in all extremities. No joint effusions.  No lower extremity tenderness.  No edema. Neurologic: Slurred speech or facial droop.  Normal speech and language. No gross or focal neurologic deficits are appreciated. Skin:  Skin is warm, dry and intact. No rash noted. Psychiatric: Cooperative.   ____________________________________________  LABS (pertinent positives/negatives) I, Lisa Roca, MD the attending physician have reviewed the labs noted below.  Labs Reviewed  COMPREHENSIVE METABOLIC PANEL - Abnormal; Notable for the following components:      Result Value   CO2 21 (*)    Glucose, Bld 219 (*)    BUN 29 (*)    Creatinine, Ser 1.28 (*)    Calcium 8.6 (*)    AST 538 (*)    ALT 319 (*)    Alkaline Phosphatase 208 (*)    Total Bilirubin 5.2 (*)    GFR calc non Af Amer 50 (*)    GFR calc Af Amer 58 (*)    All other  components within normal limits  CBC WITH DIFFERENTIAL/PLATELET - Abnormal; Notable for the following components:   WBC 20.3 (*)    RBC 4.27 (*)    HCT 38.1 (*)    Neutro Abs 18.8 (*)    Lymphs Abs 0.4 (*)    Monocytes Absolute 1.1 (*)    All other components within normal limits  CULTURE, BLOOD (ROUTINE X 2)  CULTURE, BLOOD (ROUTINE X 2)  TROPONIN I  URINALYSIS, COMPLETE (UACMP) WITH MICROSCOPIC  LACTIC ACID, PLASMA  LACTIC ACID, PLASMA    ____________________________________________    EKG I, Lisa Roca, MD, the attending physician have personally viewed and interpreted all ECGs.  79 bpm.  normal  sinus rhythm.  Nonspecific intraventricular conduction delay.  Nonspecific ST and T wave ____________________________________________  RADIOLOGY  CT head without contrast, radiologist report reviewed:   IMPRESSION: 1. No CT evidence for acute intracranial abnormality. 2. Atrophy with small vessel ischemic changes of the white matter.   Chest portable, radiologist report:  IMPRESSION: No acute cardiopulmonary abnormality seen.  Aortic Atherosclerosis (ICD10-I70.0).  RUQ [place]: Pending __________________________________________  PROCEDURES  Procedure(s) performed: None  Procedures  Critical Care performed: None   ____________________________________________  ED COURSE / ASSESSMENT AND PLAN  Pertinent labs & imaging results that were available during my care of the patient were reviewed by me and considered in my medical decision making (see chart for details).    Patient calm and cooperative, smells of urine is very unkempt.  Apparently has a mental status/confusion per family who check on him and he normally lives alone and EMS was concerned about his ability to care for himself.  He was so weak they had to help him, he could not bear his own weight.  No apparent focal weakness on exam in terms of significant concern about stroke.  The reported heart rate from 40s  to 250s, but here his heart rate seems to be between 70-80.  Afebrile here.  No hypotension.  His main complaint seems to be belly pain although nonfocal on abdominal exam.  Chest x-ray clear.  CT head without acute findings.  Laboratory study results show elevated white blood cell count as well as elevated LFTs with elevated bili, concerning for potentially obstructive stone or cholecystitis.  Patient started on dose of Zosyn.  Blood cultures have been sent.  Lactate is pending.  UA is pending   Ultrasound right lower quadrant is ordered.  Patient was also found to have acute renal failure, receiving IV fluids.  Patient care transferred to Dr. Archie Balboa at shift change 3:15 PM.  Awaiting imaging, UA, lactate.  Dispo depending on imaging, medicine versus surgery.     CONSULTATIONS:   Social work.   Patient / Family / Caregiver informed of clinical course, medical decision-making process, and agree with plan.    ___________________________________________   FINAL CLINICAL IMPRESSION(S) / ED DIAGNOSES   Final diagnoses:  Abnormal LFTs  Dehydration  AKI (acute kidney injury) ([place])      ___________________________________________         Note: This dictation was prepared with Dragon dictation. Any transcriptional errors that result from this process are unintentional    Lisa Roca, MD 09/25/18 [phone]

## 2018-09-25 NOTE — ED Notes (Signed)
Patient transported to Ultrasound 

## 2018-09-25 NOTE — ED Notes (Addendum)
Pt unable to sign for transfer. Daughter Rayshon Albaugh called at [phone] and informed of pt transfer.

## 2018-09-25 NOTE — ED Notes (Signed)
Patient repositioned in bed.

## 2018-09-26 ENCOUNTER — Encounter (HOSPITAL_COMMUNITY): Payer: Self-pay | Admitting: Gastroenterology

## 2018-09-26 ENCOUNTER — Inpatient Hospital Stay (HOSPITAL_COMMUNITY): Payer: Medicare HMO

## 2018-09-26 DIAGNOSIS — A4151 Sepsis due to Escherichia coli [E. coli]: Secondary | ICD-10-CM

## 2018-09-26 DIAGNOSIS — K8050 Calculus of bile duct without cholangitis or cholecystitis without obstruction: Secondary | ICD-10-CM

## 2018-09-26 DIAGNOSIS — K8309 Other cholangitis: Secondary | ICD-10-CM | POA: Diagnosis present

## 2018-09-26 DIAGNOSIS — R7881 Bacteremia: Secondary | ICD-10-CM

## 2018-09-26 DIAGNOSIS — B9620 Unspecified Escherichia coli [E. coli] as the cause of diseases classified elsewhere: Secondary | ICD-10-CM

## 2018-09-26 LAB — MRSA PCR SCREENING: MRSA by PCR: NEGATIVE

## 2018-09-26 LAB — C-REACTIVE PROTEIN: CRP: 15.5 mg/dL — ABNORMAL HIGH (ref <1.0)

## 2018-09-26 LAB — GLUCOSE, CAPILLARY
GLUCOSE-CAPILLARY: 134 mg/dL — AB (ref 70–99)
GLUCOSE-CAPILLARY: 85 mg/dL (ref 70–99)
GLUCOSE-CAPILLARY: 94 mg/dL (ref 70–99)
Glucose-Capillary: 108 mg/dL — ABNORMAL HIGH (ref 70–99)
Glucose-Capillary: 94 mg/dL (ref 70–99)
Glucose-Capillary: 104 mg/dL — ABNORMAL HIGH (ref 70–99)

## 2018-09-26 LAB — BLOOD CULTURE ID PANEL (REFLEXED)
ACINETOBACTER BAUMANNII: NOT DETECTED
CANDIDA ALBICANS: NOT DETECTED
CANDIDA GLABRATA: NOT DETECTED
CANDIDA PARAPSILOSIS: NOT DETECTED
CANDIDA TROPICALIS: NOT DETECTED
Candida krusei: NOT DETECTED
Carbapenem resistance: NOT DETECTED
ENTEROBACTER CLOACAE COMPLEX: NOT DETECTED
ENTEROBACTERIACEAE SPECIES: DETECTED — AB
ESCHERICHIA COLI: DETECTED — AB
Enterococcus species: NOT DETECTED
HAEMOPHILUS INFLUENZAE: NOT DETECTED
KLEBSIELLA OXYTOCA: NOT DETECTED
KLEBSIELLA PNEUMONIAE: NOT DETECTED
Listeria monocytogenes: NOT DETECTED
Neisseria meningitidis: NOT DETECTED
PROTEUS SPECIES: NOT DETECTED
Pseudomonas aeruginosa: NOT DETECTED
STREPTOCOCCUS SPECIES: NOT DETECTED
Serratia marcescens: NOT DETECTED
Staphylococcus aureus (BCID): NOT DETECTED
Staphylococcus species: NOT DETECTED
Streptococcus agalactiae: NOT DETECTED
Streptococcus pneumoniae: NOT DETECTED
Streptococcus pyogenes: NOT DETECTED

## 2018-09-26 LAB — COMPREHENSIVE METABOLIC PANEL
ALBUMIN: 2.8 g/dL — AB (ref 3.5–5.0)
ALT: 252 U/L — ABNORMAL HIGH (ref 0–44)
ANION GAP: 9 (ref 5–15)
AST: 241 U/L — ABNORMAL HIGH (ref 15–41)
Alkaline Phosphatase: 184 U/L — ABNORMAL HIGH (ref 38–126)
BILIRUBIN TOTAL: 5.8 mg/dL — AB (ref 0.3–1.2)
BUN: 26 mg/dL — ABNORMAL HIGH (ref 8–23)
CALCIUM: 8 mg/dL — AB (ref 8.9–10.3)
CO2: 20 mmol/L — ABNORMAL LOW (ref 22–32)
Chloride: 110 mmol/L (ref 98–111)
Creatinine, Ser: 1.03 mg/dL (ref 0.61–1.24)
Glucose, Bld: 144 mg/dL — ABNORMAL HIGH (ref 70–99)
Potassium: 3.5 mmol/L (ref 3.5–5.1)
Sodium: 139 mmol/L (ref 135–145)
TOTAL PROTEIN: 5.9 g/dL — AB (ref 6.5–8.1)

## 2018-09-26 LAB — CBC
HEMATOCRIT: 36.9 % — AB (ref 39.0–52.0)
Hemoglobin: 12.4 g/dL — ABNORMAL LOW (ref 13.0–17.0)
MCH: 30.6 pg (ref 26.0–34.0)
MCHC: 33.6 g/dL (ref 30.0–36.0)
MCV: 91.1 fL (ref 78.0–100.0)
Platelets: 165 10*3/uL (ref 150–400)
RBC: 4.05 MIL/uL — ABNORMAL LOW (ref 4.22–5.81)
RDW: 13.2 % (ref 11.5–15.5)
WBC: 15.4 10*3/uL — ABNORMAL HIGH (ref 4.0–10.5)

## 2018-09-26 LAB — PREALBUMIN: Prealbumin: 10 mg/dL — ABNORMAL LOW (ref 18–38)

## 2018-09-26 LAB — CORTISOL: CORTISOL PLASMA: 29.3 ug/dL

## 2018-09-26 LAB — LACTIC ACID, PLASMA: Lactic Acid, Venous: 1.7 mmol/L (ref 0.5–1.9)

## 2018-09-26 LAB — MAGNESIUM: MAGNESIUM: 2.2 mg/dL (ref 1.7–2.4)

## 2018-09-26 LAB — TSH: TSH: 1.435 u[IU]/mL (ref 0.350–4.500)

## 2018-09-26 LAB — PROCALCITONIN: PROCALCITONIN: 14.39 ng/mL

## 2018-09-26 MED ORDER — PIPERACILLIN-TAZOBACTAM 3.375 G IVPB
3.3750 g | Freq: Three times a day (TID) | INTRAVENOUS | Status: DC
  Administered 2018-09-26 – 2018-09-27 (×5): 3.375 g via INTRAVENOUS
  Filled 2018-09-26 (×6): qty 50

## 2018-09-26 MED ORDER — ASPIRIN 81 MG PO CHEW: 324.0000 mg | CHEWABLE_TABLET | ORAL | Status: DC

## 2018-09-26 MED ORDER — ASPIRIN 300 MG RE SUPP: 300.0000 mg | RECTAL | Status: DC

## 2018-09-26 MED ORDER — GABAPENTIN 300 MG PO CAPS
300.0000 mg | ORAL_CAPSULE | Freq: Every day | ORAL | Status: DC
  Administered 2018-09-26 – 2018-10-04 (×9): 300 mg via ORAL
  Filled 2018-09-26 (×4): qty 1
  Filled 2018-09-26: qty 3
  Filled 2018-09-26 (×7): qty 1

## 2018-09-26 MED ORDER — PANTOPRAZOLE SODIUM 40 MG PO TBEC
40.0000 mg | DELAYED_RELEASE_TABLET | Freq: Every day | ORAL | Status: DC
  Administered 2018-09-26 – 2018-10-05 (×10): 40 mg via ORAL
  Filled 2018-09-26 (×10): qty 1

## 2018-09-26 MED ORDER — DONEPEZIL HCL 5 MG PO TABS
5.0000 mg | ORAL_TABLET | Freq: Every day | ORAL | Status: DC
  Administered 2018-09-26 – 2018-10-05 (×10): 5 mg via ORAL
  Filled 2018-09-26 (×10): qty 1

## 2018-09-26 MED ORDER — HEPARIN SODIUM (PORCINE) 5000 UNIT/ML IJ SOLN: 5000.0000 [IU] | Freq: Three times a day (TID) | INTRAMUSCULAR | Status: DC

## 2018-09-26 MED ORDER — LACTATED RINGERS IV BOLUS
1000.0000 mL | Freq: Once | INTRAVENOUS | Status: AC
  Administered 2018-09-26: 1000 mL via INTRAVENOUS

## 2018-09-26 MED ORDER — INSULIN ASPART 100 UNIT/ML ~~LOC~~ SOLN
2.0000 [IU] | SUBCUTANEOUS | Status: DC
  Administered 2018-09-26: 2 [IU] via SUBCUTANEOUS
  Administered 2018-09-27: 6 [IU] via SUBCUTANEOUS
  Administered 2018-09-27: 2 [IU] via SUBCUTANEOUS

## 2018-09-26 MED ORDER — TAMSULOSIN HCL 0.4 MG PO CAPS
0.4000 mg | ORAL_CAPSULE | Freq: Every day | ORAL | Status: DC
  Administered 2018-09-26 – 2018-10-05 (×10): 0.4 mg via ORAL
  Filled 2018-09-26 (×10): qty 1

## 2018-09-26 MED ORDER — HEPARIN SODIUM (PORCINE) 5000 UNIT/ML IJ SOLN
5000.0000 [IU] | Freq: Three times a day (TID) | INTRAMUSCULAR | Status: DC
  Administered 2018-09-26 – 2018-09-28 (×7): 5000 [IU] via SUBCUTANEOUS
  Filled 2018-09-26 (×7): qty 1

## 2018-09-26 MED ORDER — INSULIN ASPART 100 UNIT/ML ~~LOC~~ SOLN: 2.0000 [IU] | SUBCUTANEOUS | Status: DC

## 2018-09-26 MED ORDER — ASPIRIN EC 81 MG PO TBEC
81.0000 mg | DELAYED_RELEASE_TABLET | Freq: Every day | ORAL | Status: DC
  Administered 2018-09-26 – 2018-10-05 (×10): 81 mg via ORAL
  Filled 2018-09-26 (×10): qty 1

## 2018-09-26 MED ORDER — LACTATED RINGERS IV SOLN
INTRAVENOUS | Status: DC
  Administered 2018-09-26 – 2018-09-27 (×3): via INTRAVENOUS

## 2018-09-26 NOTE — Progress Notes (Signed)
Initial Nutrition Assessment  DOCUMENTATION CODES:   Non-severe (moderate) malnutrition in context of chronic illness  INTERVENTION:    Monitor for diet advancement/toleration  Ensure Enlive po TID, each supplement provides 350 kcal and 20 grams of protein once advanced.   NUTRITION DIAGNOSIS:   Moderate Malnutrition related to chronic illness(advanced dementia) as evidenced by mild fat depletion, mild muscle depletion, moderate muscle depletion.  GOAL:   Patient will meet greater than or equal to 90% of their needs  MONITOR:   Diet advancement, Labs, I & O's, Weight trends  REASON FOR ASSESSMENT:   Malnutrition Screening Tool    ASSESSMENT:   Patient with PMH significant for CAD, DM, HLD, HTN, CVA, dementia, chronic UPJ obstruction, PBH, and renal mass. Presented to [facility] with AMS from home. Transferred to [facility] for sepsis secondary to possible cholangitis.  9/29- CTA/P confirms choledocholithiasis with two obstructing stones creating biliary duct dilatation and distension of gallbladder  Pt scheduled for ERCP tomorrow. Remains NPO.   Pt unable to provide any nutrition history. No family at bedside. GI  tried to get in touch with wife but haven't received a response. Pt seen in April 2019 at [facility] and diagnosed with severe malnutrition. No intake history obtained at that time either.    Records indicate pt weighed 156 lb 01/06/18 and 148 lb this admission (insignificant wt loss for time frame). Nutrition-Focused physical exam completed.   Without intake or weight history, unable to diagnosis severe malnutrition at this time. Pt meets criteria for moderate malnutrition this admission with fat/muscle depletions alone. Will try to obtain more history if possible.   Medications reviewed and include: SSI, LR @ 75 ml/hr Labs reviewed: corrected calcium 9 (wdl) AST 241 (H) ALT 252 (H)   NUTRITION - FOCUSED PHYSICAL EXAM:    Most Recent Value  Orbital Region  No depletion   Upper Arm Region  Moderate depletion  Thoracic and Lumbar Region  Unable to assess  Buccal Region  Mild depletion  Temple Region  Moderate depletion  Clavicle Bone Region  Mild depletion  Clavicle and Acromion Bone Region  Mild depletion  Scapular Bone Region  Unable to assess  Dorsal Hand  Mild depletion  Patellar Region  Moderate depletion  Anterior Thigh Region  Moderate depletion  Posterior Calf Region  Moderate depletion  Edema (RD Assessment)  None     Diet Order:   Diet Order            Diet NPO time specified  Diet effective now              EDUCATION NEEDS:   Not appropriate for education at this time  Skin:  Skin Assessment: Reviewed RN Assessment  Last BM:  PTA  Height:   Ht Readings from Last 1 Encounters:  09/25/18 5\' 6"  (1.676 m)    Weight:   Wt Readings from Last 1 Encounters:  09/26/18 67.2 kg    Ideal Body Weight:  59.1 kg  BMI:  Body mass index is 23.91 kg/m.  Estimated Nutritional Needs:   Kcal:  2000-2200 kcal  Protein:  100-120 grams  Fluid:  >/= 2 L/day  Mariana Single RD, LDN Clinical Nutrition Pager # - [phone]

## 2018-09-26 NOTE — Progress Notes (Addendum)
Pharmacy Antibiotic Note  Matthew Schultz is a 84 y.o. male admitted on 09/25/2018 with AMS, abd ultrasound showed some mild gallbladder wall thickening. Pharmacy is consulted to start zosyn for cholangitis. Noted pt received vancomycin 1g and zosyn 3.375g at ~ 1600 yesterday. crcl ~ 35-40 ml/min.  Noted Blood culture - E.coli  Plan: Zosyn 3.375g IV Q 8 hrs Please consider switching to rocephin?  Height: 5\' 6"  (167.6 cm) Weight: 148 lb 2.4 oz (67.2 kg) IBW/kg (Calculated) : 63.8  Temp (24hrs), Avg:99.2 F (37.3 C), Min:97.4 F (36.3 C), Max:103 F (39.4 C)  Recent Labs  Lab 09/25/18 1411 09/25/18 1441 09/25/18 1950  WBC 20.3*  --   --   CREATININE 1.28*  --   --   LATICACIDVEN  --  5.1* 4.1*    Estimated Creatinine Clearance: 38.8 mL/min (A) (by C-G formula based on SCr of 1.28 mg/dL (H)).    No Known Allergies  Antimicrobials this admission: zosyn 9/29 >>  Vancomycin 9/29 x 1   Dose adjustments this admission:   Microbiology results: 9/29 BCx: 2/2 GNR, BCID - e.coli   Thank you for allowing pharmacy to be a part of this patient's care.  Manley Mason 09/26/2018 3:58 AM

## 2018-09-26 NOTE — H&P (Addendum)
..   NAME:  Matthew Schultz, MRN:  [id number], DOB:  Aug 15, 1932, LOS: 1 ADMISSION DATE:  09/25/2018, CONSULTATION DATE:  09/25/18 REFERRING MD:  Archie Balboa MD, CHIEF COMPLAINT:  ALTERED MENTAL STATUS   Brief History   84 yr old male with PMHx of BPH, CAD, DM,HLD, HTN, OA, Renal mass and CVA.  Presented to [facility] with altered mental status from home (lives alone). Septic thought to be secondary to cholangits but CTA/P confirms choledocholithiasis with two obstructing stones ( 76m and 9 mm) creating biliary duct dilatation and distension of glabladder. [place]  Past Medical History   . Arthritis   . BPH (benign prostatic hyperplasia)   . CAD (coronary artery disease)   . [place]   . HLD (hyperlipidemia)   . HTN (hypertension)   . Hydronephrosis   . Incomplete bladder emptying   . OA (osteoarthritis)   . Renal mass   . Stroke ([facility]     Consults: date of consult/date signed off & final recs:  GI- for possible ERCP  Procedures (surgical and bedside):  none  Significant Diagnostic Tests:  CT Abdomen/Pelvis Impression 09/25/18: 1. Two filling defects in the distal common bile duct measuring 14 and 9 mm are consistent with choledocholithiasis resulting in intra and extrahepatic biliary duct dilatation and distention of the gallbladder. 2. 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 3. Anterior wedging of L3 is new since 2016. It is age indeterminate but I would favor the finding is nonacute. Recommend clinical correlation. 4. Right adrenal adenoma. 5. Atherosclerotic changes in the nonaneurysmal aorta   MRI 03/2018 IMPRESSION: 1. Gallbladder wall thickening is a characteristic of but not specific for cholecystitis. Elevated white blood cell count elevates suspicion. 2. Mild dilatation of the common bile duct without obstructing lesion. 3. Inflammation surrounding  pancreatic head and within the porta hepatis. Differential would include pancreatitis versus cholecystitis. 4. Chronic hydronephrosis of malposition RIGHT kidney in the pelvis.  Micro Data:  Negative for MRSA Blood cx 09/25/18- Gram neg rod Enterobactericae sp. E coli  Antimicrobials:  09/25/18: Zosyn IV- per pharmacy   Subjective:  [name]. Is clearly not oriented unable to follow complex directions. Has abdominal pain during my evaluation.   Objective   Blood pressure (!) 89/59, pulse (!) 59, temperature (!) 97.4 F (36.3 C), temperature source Oral, resp. rate (!) 21, height '5\' 6"'$  (1.676 m), weight 67.2 kg, SpO2 96 %.       No intake or output data in the 24 hours ending 09/26/18 0402 Filed Weights   09/25/18 2317 09/26/18 0351  Weight: 67.2 kg 67.2 kg    Examination: General: thin confused male HENT: normocephalic atraumatic difficulty focusing  Lungs: clear b/l no wheezing or rhonci Cardiovascular: S1 and S2 Abdomen: soft scaphoid diffusely tender no rebound Extremities: no edema noted no gross deformities Neuro: alert and awake but not oriented no focal deficit noted on exam Skin: some minor cuts and abrasions   Assessment & Plan:  84yr old male w/ PMHx of BPH, CAD, DM,HLD, HTN, OA, Renal mass and CVA [facility].  1. Sepsis:  MAP goal >683mg [facility] due to elevated Lactate and to meet goal of 30cc/kg.  Started on empiric abx with Zosyn IV. GNR on blood cx <  24 hrs identified as E.coli.   Check cortisol level. Last ECHO in 2017 was EF 60-65% will get 2D echo to assess LVEF.   2. Choledocholithiasis: Elevated transaminases( AST 538 ALT 319) with elevated Alk phos (208) and evidence of a 14 mm and 9 mm stones with extrahepatic biliary duct dilatation and distention of the Gallbladder. Compared this to MRI in April where  gallbladder was actively inflamed but there was no evidence of any stone. Most likely the source of Sepsis and his Ecoli bacteremia--> Consult GI for possible ERCP.  3. Pt has a chronic UPJ obstruction and a h/o BPH - indwelling foley catheter for 1st 24 hrs to monitor UOP.  4. Incidentaloma- Right Adrenal Adenoma  on CTA/P in June 2015 it was 2.2cm in size.  However MRI in April showed it was 14 mm. Now on recent CT A/P the attenuation mass in the right adrenal gland was noted to measure 2 cm. ? Increase in size.  It is <4cm in size and HU were 7- most likely benign. Continue surveillance with CTA/P in 1 year if it increases in size by 1cm or more or it is >4cm in size it may require excision.   5. Acute Kidney Injury with Anion Gap Metabolic Acidosis secondary to elevated lactate. Continue IVF as lactate clears this should improve. Replace potassium. Corrected Calcium is 9. Mg is wnl.  6. Hypoalbuminemia- check Prealbumin and CRP. Implies poor nutrition.   Disposition / Summary of Today's Plan 09/26/18   Admitted to ICU Consult to [place] for possible ERCP Continue on IV Zosyn for Ecoli bacteremia    Diet: NPO Pain/Anxiety/Delirium protocol (if indicated): per protocol VAP protocol (if indicated): not indicated DVT prophylaxis: heparin Index and SCDs GI prophylaxis: Pepcid  Hyperglycemia protocol: if BG >'180mg'$ /dl -. Start on Phase 1 ICU glycemic protocol Mobility: Bed rest  Code Status: FULL Family Communication: not at bedside at time of evaluation. Pt was found at home alone.  Labs   CBC: Recent Labs  Lab 09/25/18 1411  WBC 20.3*  NEUTROABS 18.8*  HGB 13.4  HCT 38.1*  MCV 89.1  PLT 160    Basic Metabolic Panel: Recent Labs  Lab 09/25/18 1411  NA 140  K 3.5  CL 104  CO2 21*  GLUCOSE 219*  BUN 29*  CREATININE 1.28*  CALCIUM 8.6*   GFR: Estimated Creatinine Clearance: 38.8 mL/min (A) (by C-G formula based on SCr of 1.28 mg/dL (H)). Recent Labs  Lab 09/25/18 1411  09/25/18 1441 09/25/18 1950  WBC 20.3*  --   --   LATICACIDVEN  --  5.1* 4.1*    Liver Function Tests: Recent Labs  Lab 09/25/18 1411  AST 538*  ALT 319*  ALKPHOS 208*  BILITOT 5.2*  PROT 7.1  ALBUMIN 3.5   No results for input(s): LIPASE, AMYLASE in the last 168 hours. No results for input(s): AMMONIA in the last 168 hours.  ABG No results found for: PHART, PCO2ART, PO2ART, HCO3, TCO2, ACIDBASEDEF, O2SAT   Coagulation Profile: No results for input(s): INR, PROTIME in the last 168 hours.  Cardiac Enzymes: Recent Labs  Lab 09/25/18 1411  TROPONINI <0.03    HbA1C: No results found for: HGBA1C  CBG: Recent Labs  Lab 09/25/18 2220  GLUCAP 140*    Admitting History of Present Illness.   (History per EMR and the account of other providers) [facility] documentation Pt arrived from home via from [place]. Per EMS pt's bother called EMS and reported he found pt  laying on the floor and confusedf. Per EMS family/brother was unable to provide PMHx. He presented from home where he apparently lives alone, smelling of urine, his brother found him with decreased alertness/confusion.  EMS reported really kept area, unclear that patient is really safe taking care of himself right now.  On my evaluation: pt is awake and alert . But in trying to engage him in conversation he replies with " thank you, thank you."  He does report abdominal pain but no other complaints and when asked orientation questions the responses were long and circular with no clear answer.  Review of Systems:   Marland KitchenMarland KitchenReview of Systems  Unable to perform ROS: Mental status change   Not oriented and does not answer questions clearly. Past Medical History  He,  has a past medical history of Arthritis, BPH (benign prostatic hyperplasia), CAD (coronary artery disease), DM (diabetes mellitus) ([place]), HLD (hyperlipidemia), HTN (hypertension), Hydronephrosis, Incomplete bladder emptying, OA (osteoarthritis), Renal mass, and Stroke  ([place]).   Surgical History    Past Surgical History:  Procedure Laterality Date  . BACK SURGERY       Social History   Social History   Socioeconomic History  . Marital status: Married    Spouse name: Not on file  . Number of children: Not on file  . Years of education: Not on file  . Highest education level: Not on file  Occupational History  . Not on file  Social Needs  . Financial resource strain: Not on file  . Food insecurity:    Worry: Not on file    Inability: Not on file  . Transportation needs:    Medical: Not on file    Non-medical: Not on file  Tobacco Use  . Smoking status: Former Smoker    Types: Cigarettes  . Smokeless tobacco: Current User    Types: Chew  Substance and Sexual Activity  . Alcohol use: No  . Drug use: No  . Sexual activity: Not on file  Lifestyle  . Physical activity:    Days per week: Not on file    Minutes per session: Not on file  . Stress: Not on file  Relationships  . Social connections:    Talks on phone: Not on file    Gets together: Not on file    Attends religious service: Not on file    Active member of club or organization: Not on file    Attends meetings of clubs or organizations: Not on file    Relationship status: Not on file  . Intimate partner violence:    Fear of current or ex partner: Not on file    Emotionally abused: Not on file    Physically abused: Not on file    Forced sexual activity: Not on file  Other Topics Concern  . Not on file  Social History Narrative  . Not on file  ,  reports that he has quit smoking. His smoking use included cigarettes. His smokeless tobacco use includes chew. He reports that he does not drink alcohol or use drugs.   Family History   His family history includes Pneumonia in his father. There is no history of Kidney cancer, Kidney disease, or Prostate cancer.   Allergies No Known Allergies   Home Medications  Prior to Admission medications   Medication Sig Start Date  End Date Taking? Authorizing Provider  aspirin EC 81 MG tablet Take 81 mg by mouth daily.    [provider]  donepezil (ARICEPT) 5 MG tablet Take 1 tablet by mouth daily. 04/06/18   [provider]  feeding supplement, ENSURE ENLIVE, (ENSURE ENLIVE) LIQD Take 237 mLs by mouth 3 (three) times daily between meals. 04/15/18   Saundra Shelling, MD  gabapentin (NEURONTIN) 300 MG capsule Take 300 mg by mouth at bedtime.     [provider]  metoprolol tartrate (LOPRESSOR) 25 MG tablet Take 1 tablet by mouth 2 (two) times daily. 03/28/18   [provider]  omeprazole (PRILOSEC) 20 MG capsule Take 20 mg by mouth 2 (two) times daily.    [provider]  tamsulosin (FLOMAX) 0.4 MG CAPS capsule Take 1 capsule (0.4 mg total) by mouth daily. 09/07/16   Nori Riis, PA-C        I, Dr Seward Carol have personally reviewed patient's available data, including medical history, events of note, physical examination and test results as part of my evaluation. I have discussed with NP and other care providers such as pharmacist, RN and Elink.  In addition,  I personally evaluated patient The patient is critically ill with multiple organ systems failure and requires high complexity decision making for assessment and support, frequent evaluation and titration of therapies, application of advanced monitoring technologies and extensive interpretation of multiple databases.   Critical Care Time devoted to patient care services described in this note is 33 Minutes. This time reflects time of care of this signee Dr Seward Carol. This critical care time does not reflect procedure time, or teaching time or supervisory time of NP but could involve care discussion time   CC TIME:55  minutes CODE STATUS:FULL DISPOSITION:ICU PROGNOSIS:Guarded :   Dr. Seward Carol Pulmonary Critical Care Medicine  09/26/2018 4:02 AM

## 2018-09-26 NOTE — Consult Note (Signed)
Reason for Consult: CBD stones Referring Physician: Hospital team  Matthew Schultz is an 84 y.o. male.  HPI: Patient seen and examined in hospital computer chart reviewed and he actually looks better than his story sounds and he actually does not have any current complaints and was not aware he had gallstones and I tried to call his wife but she has not returned my call and the case was discussed with the nurse as well  Past Medical History:  Diagnosis Date  . Arthritis   . BPH (benign prostatic hyperplasia)   . CAD (coronary artery disease)   . DM (diabetes mellitus) ([place])   . HLD (hyperlipidemia)   . HTN (hypertension)   . Hydronephrosis   . Incomplete bladder emptying   . OA (osteoarthritis)   . Renal mass   . Stroke [facility])     Past Surgical History:  Procedure Laterality Date  . BACK SURGERY      Family History  Problem Relation Age of Onset  . Pneumonia Father   . Kidney cancer Neg Hx   . Kidney disease Neg Hx   . Prostate cancer Neg Hx     Social History:  reports that he has quit smoking. His smoking use included cigarettes. His smokeless tobacco use includes chew. He reports that he does not drink alcohol or use drugs.  Allergies: No Known Allergies  Medications: I have reviewed the patient's current medications.  Results for orders placed or performed during the hospital encounter of 09/25/18 (from the past 48 hour(s))  MRSA PCR Screening     Status: None   Collection Time: 09/25/18 11:18 PM  Result Value Ref Range   MRSA by PCR NEGATIVE NEGATIVE    Comment:        The GeneXpert MRSA Assay (FDA approved for NASAL specimens only), is one component of a comprehensive MRSA colonization surveillance program. It is not intended to diagnose MRSA infection nor to guide or monitor treatment for MRSA infections. Performed at [facility], [place] [address], [place], [place] [address]   Lactic acid, plasma     Status: None   Collection Time: 09/26/18  3:50  AM  Result Value Ref Range   Lactic Acid, Venous 1.7 0.5 - 1.9 mmol/L    Comment: Performed at [facility] [address] [place], [place] [address]  Cortisol     Status: None   Collection Time: 09/26/18  3:50 AM  Result Value Ref Range   Cortisol, Plasma 29.3 ug/dL    Comment: (NOTE) AM    6.7 - 22.6 ug/dL PM   <10.0       ug/dL Performed at [place] [address] [place], [place] [address]   CBC     Status: Abnormal   Collection Time: 09/26/18  3:50 AM  Result Value Ref Range   WBC 15.4 (H) 4.0 - 10.5 K/uL   RBC 4.05 (L) 4.22 - 5.81 MIL/uL   Hemoglobin 12.4 (L) 13.0 - 17.0 g/dL   HCT 36.9 (L) 39.0 - 52.0 %   MCV 91.1 78.0 - 100.0 fL   MCH 30.6 26.0 - 34.0 pg   MCHC 33.6 30.0 - 36.0 g/dL   RDW 13.2 11.5 - 15.5 %   Platelets 165 150 - 400 K/uL    Comment: Performed at [facility], [place] [address] [place], [place] [address]  Prealbumin     Status: Abnormal   Collection Time: 09/26/18  3:50  AM  Result Value Ref Range   Prealbumin 10.0 (L) 18 - 38 mg/dL    Comment: Performed at [place] [address] [place], [place] [address]  C-reactive protein     Status: Abnormal   Collection Time: 09/26/18  3:50 AM  Result Value Ref Range   CRP 15.5 (H) <1.0 mg/dL    Comment: Performed at [place] [address] [place], [place] [address]  TSH     Status: None   Collection Time: 09/26/18  3:50 AM  Result Value Ref Range   TSH 1.435 0.350 - 4.500 uIU/mL    Comment: Performed by a 3rd Generation assay with a functional sensitivity of <=0.01 uIU/mL. Performed at [facility], [place] [address] [place], [place] [address]   Comprehensive metabolic panel     Status: Abnormal   Collection Time: 09/26/18  3:56 AM  Result Value Ref Range   Sodium 139 135 - 145 mmol/L   Potassium 3.5 3.5 - 5.1 mmol/L   Chloride 110 98 - 111 mmol/L   CO2 20 (L) 22 - 32 mmol/L   Glucose, Bld 144 (H) 70 - 99 mg/dL   BUN 26 (H) 8 - 23 mg/dL    Creatinine, Ser 1.03 0.61 - 1.24 mg/dL   Calcium 8.0 (L) 8.9 - 10.3 mg/dL   Total Protein 5.9 (L) 6.5 - 8.1 g/dL   Albumin 2.8 (L) 3.5 - 5.0 g/dL   AST 241 (H) 15 - 41 U/L   ALT 252 (H) 0 - 44 U/L   Alkaline Phosphatase 184 (H) 38 - 126 U/L   Total Bilirubin 5.8 (H) 0.3 - 1.2 mg/dL   GFR calc non Af Amer >60 >60 mL/min   GFR calc Af Amer >60 >60 mL/min    Comment: (NOTE) The eGFR has been calculated using the CKD EPI equation. This calculation has not been validated in all clinical situations. eGFR's persistently <60 mL/min signify possible Chronic Kidney Disease.    Anion gap 9 5 - 15    Comment: Performed at [place] [address], [place], [place] [address]  Magnesium     Status: None   Collection Time: 09/26/18  3:56 AM  Result Value Ref Range   Magnesium 2.2 1.7 - 2.4 mg/dL    Comment: Performed at [place] [address], [place], [place] [address]  Procalcitonin     Status: None   Collection Time: 09/26/18  3:56 AM  Result Value Ref Range   Procalcitonin 14.39 ng/mL    Comment:        Interpretation: PCT >= 10 ng/mL: Important systemic inflammatory response, almost exclusively due to severe bacterial sepsis or septic shock. (NOTE)       Sepsis PCT Algorithm           Lower Respiratory Tract                                      Infection PCT Algorithm    ----------------------------     ----------------------------         PCT < 0.25 ng/mL                PCT < 0.10 ng/mL         Strongly encourage             Strongly discourage   discontinuation  of antibiotics    initiation of antibiotics    ----------------------------     -----------------------------       PCT 0.25 - 0.50 ng/mL            PCT 0.10 - 0.25 ng/mL               OR       >80% decrease in PCT            Discourage initiation of                                            antibiotics      Encourage discontinuation           of antibiotics    ----------------------------      -----------------------------         PCT >= 0.50 ng/mL              PCT 0.26 - 0.50 ng/mL                AND       <80% decrease in PCT             Encourage initiation of                                             antibiotics       Encourage continuation           of antibiotics    ----------------------------     -----------------------------        PCT >= 0.50 ng/mL                  PCT > 0.50 ng/mL               AND         increase in PCT                  Strongly encourage                                      initiation of antibiotics    Strongly encourage escalation           of antibiotics                                     -----------------------------                                           PCT <= 0.25 ng/mL                                                 OR                                        >  80% decrease in PCT                                     Discontinue / Do not initiate                                             antibiotics Performed at [facility], [place] [address] [place], [place] [address]   Glucose, capillary     Status: Abnormal   Collection Time: 09/26/18  4:27 AM  Result Value Ref Range   Glucose-Capillary 134 (H) 70 - 99 mg/dL  Glucose, capillary     Status: Abnormal   Collection Time: 09/26/18  7:40 AM  Result Value Ref Range   Glucose-Capillary 108 (H) 70 - 99 mg/dL    Ct Head Wo Contrast  Result Date: 09/25/2018 CLINICAL DATA:  Altered LOC EXAM: CT HEAD WITHOUT CONTRAST TECHNIQUE: Contiguous axial images were obtained from the base of the skull through the vertex without intravenous contrast. COMPARISON:  MRI brain 11/29/2013, CT brain 11/28/2013 FINDINGS: Brain: No acute territorial infarction, hemorrhage or intracranial mass is visualized. Moderate-to-marked atrophy. Moderate small vessel ischemic changes of the white matter. Stable ventricle size. Vascular: No hyperdense vessel.  Carotid vascular calcification Skull: No fracture.  Stable heterogenous lucency in the right parietal bone. Sinuses/Orbits: No acute finding. Other: None IMPRESSION: 1. No CT evidence for acute intracranial abnormality. 2. Atrophy with small vessel ischemic changes of the white matter. Electronically Signed   By: Donavan Foil M.D.   On: 09/25/2018 15:00   Ct Abdomen Pelvis W Contrast  Result Date: 09/25/2018 CLINICAL DATA:  Right-sided abdominal pain. EXAM: CT ABDOMEN AND PELVIS WITH CONTRAST TECHNIQUE: Multidetector CT imaging of the abdomen and pelvis was performed using the standard protocol following bolus administration of intravenous contrast. CONTRAST:  11m ISOVUE-300 IOPAMIDOL (ISOVUE-300) INJECTION 61% COMPARISON:  Ultrasound September 25, 2018 and CT scan June 26, 2014 FINDINGS: Lower chest: No acute abnormality. Hepatobiliary: Intra and extrahepatic biliary duct dilatation is identified. The common bile duct measures up to 2 cm. At least 2 filling defects are seen in the distal common bile duct with the largest measuring 14 mm on coronal image 41 and a smaller more distal apparent defect measuring 9 mm on coronal image 43. There is distention of the gallbladder. No liver mass identified. Portal vein is patent. Pancreas: Unremarkable. No pancreatic ductal dilatation or surrounding inflammatory changes. Spleen: Normal in size without focal abnormality. Adrenals/Urinary Tract: The left adrenal gland is normal. A low-attenuation mass in the right adrenal gland was noted to be adenoma on previous imaging. This adenoma measures nearly 2 cm. There is a small cyst in the left kidney which is otherwise normal. The left ureter is normal. A focus of air in the bladder is likely from recent catheterization. The bladder is otherwise normal. The patient has a right pelvic kidney with at least 1 cysts and severe hydronephrosis, unchanged. The ureter is not dilated. There is mild excretion into the dilated collecting system on delayed images. Stomach/Bowel: The  stomach and small bowel are normal. The colon and visualized portions of the appendix are normal. Vascular/Lymphatic: Atherosclerotic changes are seen in the nonaneurysmal aorta. No adenopathy. Reproductive: Prostate is unremarkable. Other: No abdominal wall hernia or abnormality. No abdominopelvic ascites. Musculoskeletal: Anterior  wedging of L3 is new since 2016. No other acute bony abnormalities. IMPRESSION: 1. Two filling defects in the distal common bile duct measuring 14 and 9 mm are consistent with choledocholithiasis resulting in intra and extrahepatic biliary duct dilatation and distention of the gallbladder. 2. 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 3. Anterior wedging of L3 is new since 2016. It is age indeterminate but I would favor the finding is nonacute. Recommend clinical correlation. 4. Right adrenal adenoma. 5. Atherosclerotic changes in the nonaneurysmal aorta. Electronically Signed   By: Dorise Bullion III M.D   On: 09/25/2018 18:34   Dg Chest Portable 1 View  Result Date: 09/25/2018 CLINICAL DATA:  Cough and congestion. EXAM: PORTABLE CHEST 1 VIEW COMPARISON:  September 25, 2018 FINDINGS: Scoliotic curvature of the thoracic spine. Atelectasis in the left base. The heart, hila, mediastinum, lungs, and pleura are otherwise unchanged. IMPRESSION: No active disease. Electronically Signed   By: Dorise Bullion III M.D   On: 09/25/2018 18:52   Dg Chest Port 1 View  Result Date: 09/25/2018 CLINICAL DATA:  Altered mental status. EXAM: PORTABLE CHEST 1 VIEW COMPARISON:  Radiograph of October 19, 2016. FINDINGS: Stable cardiomegaly. Atherosclerosis of thoracic aorta is noted. Dextroscoliosis of thoracic spine is noted. No pneumothorax is noted. Lungs are clear. Bony thorax is unremarkable. IMPRESSION: No acute cardiopulmonary abnormality seen. Aortic Atherosclerosis (ICD10-I70.0). Electronically Signed   By: Marijo Conception, M.D.    On: 09/25/2018 14:50   US Abdomen Limited Ruq  Result Date: 09/25/2018 CLINICAL DATA:  Elevated LFTs EXAM: ULTRASOUND ABDOMEN LIMITED RIGHT UPPER QUADRANT COMPARISON:  None. FINDINGS: Gallbladder: Small amount of sludge. No cholelithiasis. No pericholecystic fluid. Thickened gallbladder wall measuring up to 4.5 mm. Negative sonographic Murphy sign. Common bile duct: Diameter: 8 mm distally. No obstructing mass or choledocholithiasis. Liver: No focal lesion identified. Within normal limits in parenchymal echogenicity. Portal vein is patent on color Doppler imaging with normal direction of blood flow towards the liver. IMPRESSION: No cholelithiasis. Mild gallbladder wall thickening which may be reactive secondary to hepatocellular disease given the elevated LFTs. No sonographic evidence of acute cholecystitis. Electronically Signed   By: Kathreen Devoid   On: 09/25/2018 16:37    ROS negative except above Blood pressure 113/62, pulse (!) 53, temperature (!) 97.5 F (36.4 C), temperature source Oral, resp. rate 17, height '5\' 6"'$  (1.676 m), weight 67.2 kg, SpO2 100 %. Physical Exam vital signs stable afebrile no acute distress lungs are clear heart regular rate and rhythm abdomen is soft occasional bowel sounds nontender labs and CT reviewed  Assessment/Plan: CBD stones on CT and patient with probable cholangitis and multiple other medical problems Plan: Have tentatively scheduled him for ERCP at 730 tomorrow and will continue to try to reach the wife to discuss that procedure  [facility] E 09/26/2018, 10:11 AM

## 2018-09-27 ENCOUNTER — Inpatient Hospital Stay (HOSPITAL_COMMUNITY): Payer: Medicare HMO | Admitting: Certified Registered"

## 2018-09-27 ENCOUNTER — Inpatient Hospital Stay (HOSPITAL_COMMUNITY): Payer: Medicare HMO

## 2018-09-27 ENCOUNTER — Encounter (HOSPITAL_COMMUNITY): Payer: Self-pay | Admitting: Certified Registered"

## 2018-09-27 ENCOUNTER — Encounter (HOSPITAL_COMMUNITY)
Admission: AD | Disposition: A | Discharge status: Skilled Nursing Facility | Payer: Self-pay | Source: Other Acute Inpatient Hospital | Attending: Internal Medicine

## 2018-09-27 DIAGNOSIS — I5030 Unspecified diastolic (congestive) heart failure: Secondary | ICD-10-CM

## 2018-09-27 DIAGNOSIS — E440 Moderate protein-calorie malnutrition: Secondary | ICD-10-CM

## 2018-09-27 DIAGNOSIS — K8051 Calculus of bile duct without cholangitis or cholecystitis with obstruction: Secondary | ICD-10-CM

## 2018-09-27 HISTORY — PX: ERCP: SHX5425

## 2018-09-27 HISTORY — PX: REMOVAL OF STONES: SHX5545

## 2018-09-27 HISTORY — PX: SPHINCTEROTOMY: SHX5544

## 2018-09-27 LAB — PROCALCITONIN: PROCALCITONIN: 6.09 ng/mL

## 2018-09-27 LAB — GLUCOSE, CAPILLARY
GLUCOSE-CAPILLARY: 136 mg/dL — AB (ref 70–99)
Glucose-Capillary: 88 mg/dL (ref 70–99)
Glucose-Capillary: 106 mg/dL — ABNORMAL HIGH (ref 70–99)
Glucose-Capillary: 211 mg/dL — ABNORMAL HIGH (ref 70–99)
Glucose-Capillary: 154 mg/dL — ABNORMAL HIGH (ref 70–99)

## 2018-09-27 LAB — ECHOCARDIOGRAM COMPLETE
HEIGHTINCHES: 66 in
WEIGHTICAEL: 2451.52 [oz_av]

## 2018-09-27 SURGERY — ERCP, WITH INTERVENTION IF INDICATED
Anesthesia: General

## 2018-09-27 MED ORDER — PHENYLEPHRINE 40 MCG/ML (10ML) SYRINGE FOR IV PUSH (FOR BLOOD PRESSURE SUPPORT)
PREFILLED_SYRINGE | INTRAVENOUS | Status: DC | PRN
  Administered 2018-09-27: 80 ug via INTRAVENOUS

## 2018-09-27 MED ORDER — GLYCOPYRROLATE 0.2 MG/ML IJ SOLN
INTRAMUSCULAR | Status: DC | PRN
  Administered 2018-09-27: 0.1 mg via INTRAVENOUS

## 2018-09-27 MED ORDER — SUCCINYLCHOLINE CHLORIDE 200 MG/10ML IV SOSY
PREFILLED_SYRINGE | INTRAVENOUS | Status: DC | PRN
  Administered 2018-09-27: 120 mg via INTRAVENOUS

## 2018-09-27 MED ORDER — IOPAMIDOL (ISOVUE-300) INJECTION 61%
INTRAVENOUS | Status: AC
  Filled 2018-09-27: qty 50

## 2018-09-27 MED ORDER — PROPOFOL 10 MG/ML IV BOLUS
INTRAVENOUS | Status: DC | PRN
  Administered 2018-09-27: 120 mg via INTRAVENOUS

## 2018-09-27 MED ORDER — SODIUM CHLORIDE 0.9 % IV SOLN
2.0000 g | INTRAVENOUS | Status: DC
  Administered 2018-09-27: 2 g via INTRAVENOUS
  Filled 2018-09-27 (×2): qty 20

## 2018-09-27 MED ORDER — FENTANYL CITRATE (PF) 100 MCG/2ML IJ SOLN
INTRAMUSCULAR | Status: DC | PRN
  Administered 2018-09-27 (×2): 50 ug via INTRAVENOUS

## 2018-09-27 MED ORDER — INSULIN ASPART 100 UNIT/ML ~~LOC~~ SOLN: 0.0000 [IU] | Freq: Every day | SUBCUTANEOUS | Status: DC

## 2018-09-27 MED ORDER — INSULIN ASPART 100 UNIT/ML ~~LOC~~ SOLN
0.0000 [IU] | Freq: Three times a day (TID) | SUBCUTANEOUS | Status: DC
  Administered 2018-09-28: 2 [IU] via SUBCUTANEOUS
  Administered 2018-09-30 – 2018-10-01 (×3): 1 [IU] via SUBCUTANEOUS
  Administered 2018-10-02: 2 [IU] via SUBCUTANEOUS
  Administered 2018-10-02: 1 [IU] via SUBCUTANEOUS
  Administered 2018-10-02 – 2018-10-03 (×2): 2 [IU] via SUBCUTANEOUS
  Administered 2018-10-03: 1 [IU] via SUBCUTANEOUS
  Administered 2018-10-03 – 2018-10-04 (×2): 2 [IU] via SUBCUTANEOUS
  Administered 2018-10-04: 1 [IU] via SUBCUTANEOUS
  Administered 2018-10-04: 2 [IU] via SUBCUTANEOUS
  Administered 2018-10-05 (×2): 1 [IU] via SUBCUTANEOUS

## 2018-09-27 MED ORDER — SODIUM CHLORIDE 0.9 % IV SOLN
INTRAVENOUS | Status: DC
  Administered 2018-09-27: 18:00:00 via INTRAVENOUS
  Administered 2018-09-28: 1000 mL via INTRAVENOUS
  Administered 2018-09-28 (×2): via INTRAVENOUS
  Administered 2018-09-29: 1000 mL via INTRAVENOUS

## 2018-09-27 MED ORDER — ONDANSETRON HCL 4 MG/2ML IJ SOLN
INTRAMUSCULAR | Status: DC | PRN
  Administered 2018-09-27: 4 mg via INTRAVENOUS

## 2018-09-27 MED ORDER — LIDOCAINE 2% (20 MG/ML) 5 ML SYRINGE
INTRAMUSCULAR | Status: DC | PRN
  Administered 2018-09-27: 60 mg via INTRAVENOUS

## 2018-09-27 MED ORDER — SODIUM CHLORIDE 0.9 % IV SOLN
INTRAVENOUS | Status: DC | PRN
  Administered 2018-09-27: 60 ug/min via INTRAVENOUS

## 2018-09-27 MED ORDER — TAMSULOSIN HCL 0.4 MG PO CAPS: 0.4000 mg | ORAL_CAPSULE | Freq: Every day | ORAL | Status: DC

## 2018-09-27 MED ORDER — SODIUM CHLORIDE 0.9 % IV SOLN
INTRAVENOUS | Status: DC | PRN
  Administered 2018-09-27: 30 mL

## 2018-09-27 MED ORDER — DEXAMETHASONE SODIUM PHOSPHATE 4 MG/ML IJ SOLN
INTRAMUSCULAR | Status: DC | PRN
  Administered 2018-09-27: 8 mg via INTRAVENOUS

## 2018-09-27 MED ORDER — SUGAMMADEX SODIUM 200 MG/2ML IV SOLN
INTRAVENOUS | Status: DC | PRN
  Administered 2018-09-27: 150 mg via INTRAVENOUS

## 2018-09-27 MED ORDER — ROCURONIUM BROMIDE 100 MG/10ML IV SOLN
INTRAVENOUS | Status: DC | PRN
  Administered 2018-09-27: 20 mg via INTRAVENOUS

## 2018-09-27 MED ORDER — INDOMETHACIN 50 MG RE SUPP
RECTAL | Status: AC
  Filled 2018-09-27: qty 2

## 2018-09-27 NOTE — Transfer of Care (Signed)
Immediate Anesthesia Transfer of Care Note  Patient: Matthew Schultz  Procedure(s) Performed: ENDOSCOPIC RETROGRADE CHOLANGIOPANCREATOGRAPHY (ERCP) (N/A )  Patient Location: Endoscopy Unit  Anesthesia Type:General  Level of Consciousness: drowsy  Airway & Oxygen Therapy: Patient Spontanous Breathing and Patient connected to face mask oxygen  Post-op Assessment: Report given to RN and Post -op Vital signs reviewed and stable  Post vital signs: Reviewed and stable  Last Vitals:  Vitals Value Taken Time  BP    Temp    Pulse 65 09/27/2018  8:40 AM  Resp 23 09/27/2018  8:40 AM  SpO2 99 % 09/27/2018  8:40 AM  Vitals shown include unvalidated device data.  Last Pain:  Vitals:   09/27/18 0709  TempSrc: Oral         Complications: No apparent anesthesia complications

## 2018-09-27 NOTE — Progress Notes (Signed)
[name] TEAM 1 - Stepdown/ICU TEAM  Matthew Schultz  YIR:[id number] DOB: 20-Jan-1932 DOA: 09/25/2018 PCP: Matthew Pink, MD    Brief Narrative:  84 yr old male with a Hx of BPH, CAD, DM, HLD, HTN, OA, Renal mass, and CVA who presented to [facility] with altered mental status. He was found to be septic with high suspicion of cholangits and CTA/P confirmed choledocholithiasis with two obstructing stones (91mm and 9 mm). He was to [facility] for ERCP.  Subjective: Somewhat confused post-ERCP, but alert and interactive. Looks much better per family. Denies CP, NV, SOB, or even abdom pain.   Assessment & Plan:  Sepsis due to E coli bacteremia due to Choledocholithiasis  s/p ERCP w/ stone removal 10/1 - clinically improving already - narrow abx coverage - GI continues to follow - advance diet as able - will require GB resection when clinically stable (consider consulting Gen Surgery 10/2)  Chronic UPJ obstruction and h/o BPH Cont flomax   Incidental R Adrenal Adenoma Noted on on CT A/P June 2015 at 2.2cm - CT A/P now suggests 2 cm - most likely benign - will need CT A/P in 1 year   Acute Kidney Injury Rapidly improving w/ volume expansion - follow trend   CAD  Quiescent   DM CBG currently reasonably controlled   HTN Not an active issue at this time - BB on hold - follow   DVT prophylaxis: SQ heparin  Code Status: FULL CODE Family Communication: no family present at time of exam  Disposition Plan: stable for tele bed - slowly advance diet - PT/OT - cont IV abx   Consultants:  [facility]  Antimicrobials:  Zosyn 9/29 > 10/1 Rocephin 10/1 >  Objective: Blood pressure 111/70, pulse 68, temperature 98.1 F (36.7 C), temperature source Oral, resp. rate (!) 24, height 5\' 6"  (1.676 m), weight 69.5 kg, SpO2 96 %.  Intake/Output Summary (Last 24 hours) at 09/27/2018 1102 Last data filed at 09/27/2018 0900 Gross per 24 hour  Intake 2211.52 ml  Output 200 ml  Net 2011.52 ml    Filed Weights   09/26/18 0351 09/27/18 0500 09/27/18 0709  Weight: 67.2 kg 69.5 kg 69.5 kg    Examination: General: No acute respiratory distress Lungs: Clear to auscultation bilaterally without wheezes or crackles Cardiovascular: Regular rate and rhythm without murmur gallop or rub normal S1 and S2 Abdomen: Nontender, nondistended, soft, bowel sounds positive, no rebound, no ascites, no appreciable mass Extremities: No significant cyanosis, clubbing, or edema bilateral lower extremities  CBC: Recent Labs  Lab 09/25/18 1411 09/26/18 0350  WBC 20.3* 15.4*  NEUTROABS 18.8*  --   HGB 13.4 12.4*  HCT 38.1* 36.9*  MCV 89.1 91.1  PLT 178 500   Basic Metabolic Panel: Recent Labs  Lab 09/25/18 1411 09/26/18 0356  NA 140 139  K 3.5 3.5  CL 104 110  CO2 21* 20*  GLUCOSE 219* 144*  BUN 29* 26*  CREATININE 1.28* 1.03  CALCIUM 8.6* 8.0*  MG  --  2.2   GFR: Estimated Creatinine Clearance: 48.2 mL/min (by C-G formula based on SCr of 1.03 mg/dL).  Liver Function Tests: Recent Labs  Lab 09/25/18 1411 09/26/18 0356  AST 538* 241*  ALT 319* 252*  ALKPHOS 208* 184*  BILITOT 5.2* 5.8*  PROT 7.1 5.9*  ALBUMIN 3.5 2.8*    Cardiac Enzymes: Recent Labs  Lab 09/25/18 1411  TROPONINI <0.03    CBG: Recent Labs  Lab 09/26/18 1512 09/26/18 2024  09/26/18 2334 09/27/18 0347 09/27/18 0930  GLUCAP 104* 85 94 88 106*    Recent Results (from the past 240 hour(s))  Culture, blood (routine x 2)     Status: None (Preliminary result)   Collection Time: 09/25/18  2:41 PM  Result Value Ref Range Status   Specimen Description   Final    BLOOD RAC Performed at [facility], [address], [place], [place] [address]    Special Requests   Final    BOTTLES DRAWN AEROBIC AND ANAEROBIC Blood Culture adequate volume Performed at [facility], [address], [place], [place] [address]    Culture  Setup Time   Final    GRAM NEGATIVE RODS IN BOTH AEROBIC  AND ANAEROBIC BOTTLES CRITICAL RESULT CALLED TO, READ BACK BY AND VERIFIED WITH: DAVID BESANTI AT 2409 09/26/18.PMH    Culture   Final    GRAM NEGATIVE RODS IDENTIFICATION AND SUSCEPTIBILITIES TO FOLLOW Performed at [facility], [place] [address], [place], [place] [address]    Report Status PENDING  Incomplete  Culture, blood (routine x 2)     Status: None (Preliminary result)   Collection Time: 09/25/18  2:41 PM  Result Value Ref Range Status   Specimen Description BLOOD L AC  Final   Special Requests   Final    BOTTLES DRAWN AEROBIC AND ANAEROBIC Blood Culture adequate volume   Culture  Setup Time   Final    GRAM NEGATIVE RODS IN BOTH AEROBIC AND ANAEROBIC BOTTLES CRITICAL RESULT CALLED TO, READ BACK BY AND VERIFIED WITH: DAVID BESANTI AT 9924 09/26/18.PMH Performed at [facility], [address] [place], [place] [address]    Culture GRAM NEGATIVE RODS  Final   Report Status PENDING  Incomplete  Blood Culture ID Panel (Reflexed)     Status: Abnormal   Collection Time: 09/25/18  2:41 PM  Result Value Ref Range Status   Enterococcus species NOT DETECTED NOT DETECTED Final   Listeria monocytogenes NOT DETECTED NOT DETECTED Final   Staphylococcus species NOT DETECTED NOT DETECTED Final   Staphylococcus aureus NOT DETECTED NOT DETECTED Final   Streptococcus species NOT DETECTED NOT DETECTED Final   Streptococcus agalactiae NOT DETECTED NOT DETECTED Final   Streptococcus pneumoniae NOT DETECTED NOT DETECTED Final   Streptococcus pyogenes NOT DETECTED NOT DETECTED Final   Acinetobacter baumannii NOT DETECTED NOT DETECTED Final   Enterobacteriaceae species DETECTED (A) NOT DETECTED Final    Comment: Enterobacteriaceae represent a large family of gram-negative bacteria, not a single organism. CRITICAL RESULT CALLED TO, READ BACK BY AND VERIFIED WITH: DAVID BESANTI AT 1962 09/26/18.PMH    Enterobacter cloacae complex NOT DETECTED NOT DETECTED Final   Escherichia coli  DETECTED (A) NOT DETECTED Final    Comment: CRITICAL RESULT CALLED TO, READ BACK BY AND VERIFIED WITH: DAVID BESANTI AT 2297 09/26/18.PMH    Klebsiella oxytoca NOT DETECTED NOT DETECTED Final   Klebsiella pneumoniae NOT DETECTED NOT DETECTED Final   Proteus species NOT DETECTED NOT DETECTED Final   Serratia marcescens NOT DETECTED NOT DETECTED Final   Carbapenem resistance NOT DETECTED NOT DETECTED Final   Haemophilus influenzae NOT DETECTED NOT DETECTED Final   Neisseria meningitidis NOT DETECTED NOT DETECTED Final   Pseudomonas aeruginosa NOT DETECTED NOT DETECTED Final   Candida albicans NOT DETECTED NOT DETECTED Final   Candida glabrata NOT DETECTED NOT DETECTED Final   Candida krusei NOT DETECTED NOT DETECTED Final   Candida parapsilosis NOT DETECTED NOT DETECTED Final  Candida tropicalis NOT DETECTED NOT DETECTED Final    Comment: Performed at [facility], [address] [place], [place] [address]  MRSA PCR Screening     Status: None   Collection Time: 09/25/18 11:18 PM  Result Value Ref Range Status   MRSA by PCR NEGATIVE NEGATIVE Final    Comment:        The GeneXpert MRSA Assay (FDA approved for NASAL specimens only), is one component of a comprehensive MRSA colonization surveillance program. It is not intended to diagnose MRSA infection nor to guide or monitor treatment for MRSA infections. Performed at [facility], [place] [address], [place], [place] [address]      Scheduled Meds: . aspirin EC  81 mg Oral Daily  . donepezil  5 mg Oral Daily  . gabapentin  300 mg Oral QHS  . heparin  5,000 Units Subcutaneous Q8H  . insulin aspart  2-6 Units Subcutaneous Q4H  . pantoprazole  40 mg Oral Daily  . tamsulosin  0.4 mg Oral Daily   Continuous Infusions: . lactated ringers Stopped (09/27/18 0853)  . piperacillin-tazobactam (ZOSYN)  IV 12.5 mL/hr at 09/27/18 0601     LOS: 2 days   Matthew Altes, MD [facility] Office   [phone] Pager - Text Page per Amion  If 7PM-7AM, please contact night-coverage per Amion 09/27/2018, 11:02 AM

## 2018-09-27 NOTE — Progress Notes (Addendum)
  Echocardiogram 2D Echocardiogram has been attempted.  Matthew Schultz 09/27/2018, 10:35 AM

## 2018-09-27 NOTE — Anesthesia Postprocedure Evaluation (Signed)
Anesthesia Post Note  Patient: Matthew Schultz  Procedure(s) Performed: ENDOSCOPIC RETROGRADE CHOLANGIOPANCREATOGRAPHY (ERCP) (N/A ) SPHINCTEROTOMY REMOVAL OF STONES     Patient location during evaluation: Endoscopy Anesthesia Type: General Level of consciousness: awake and alert Pain management: pain level controlled Vital Signs Assessment: post-procedure vital signs reviewed and stable Respiratory status: spontaneous breathing, nonlabored ventilation and respiratory function stable Cardiovascular status: blood pressure returned to baseline and stable Postop Assessment: no apparent nausea or vomiting Anesthetic complications: no    Last Vitals:  Vitals:   09/27/18 0940 09/27/18 1000  BP:  111/70  Pulse:  68  Resp:  (!) 24  Temp: 36.7 C   SpO2:  96%    Last Pain:  Vitals:   09/27/18 0940  TempSrc: Oral                 Lynda Rainwater

## 2018-09-27 NOTE — Progress Notes (Signed)
  Echocardiogram 2D Echocardiogram has been performed.  Denis Carreon G Daemyn Gariepy 09/27/2018, 2:25 PM

## 2018-09-27 NOTE — Anesthesia Procedure Notes (Signed)
Procedure Name: Intubation Date/Time: 09/27/2018 7:48 AM Performed by: Orlie Dakin, CRNA Pre-anesthesia Checklist: Patient identified, Emergency Drugs available, Suction available and Patient being monitored Patient Re-evaluated:Patient Re-evaluated prior to induction Oxygen Delivery Method: Circle system utilized Preoxygenation: Pre-oxygenation with 100% oxygen Induction Type: IV induction Ventilation: Mask ventilation without difficulty Laryngoscope Size: Miller and 3 Grade View: Grade I Tube type: Oral Tube size: 7.5 mm Number of attempts: 1 Airway Equipment and Method: Stylet Placement Confirmation: ETT inserted through vocal cords under direct vision,  positive ETCO2 and breath sounds checked- equal and bilateral Secured at: 23 cm Tube secured with: Tape Dental Injury: Teeth and Oropharynx as per pre-operative assessment

## 2018-09-27 NOTE — Anesthesia Preprocedure Evaluation (Signed)
Anesthesia Evaluation  Patient identified by MRN, date of birth, ID band Patient awake    Reviewed: Allergy & Precautions, NPO status , Patient's Chart, lab work & pertinent test results  Airway Mallampati: II  TM Distance: >3 FB Neck ROM: Full    Dental no notable dental hx.    Pulmonary neg pulmonary ROS, former smoker,    Pulmonary exam normal breath sounds clear to auscultation       Cardiovascular hypertension, Pt. on medications + CAD  negative cardio ROS Normal cardiovascular exam Rhythm:Regular Rate:Normal     Neuro/Psych Dementia CVA negative neurological ROS  negative psych ROS   GI/Hepatic negative GI ROS, Neg liver ROS,   Endo/Other  negative endocrine ROSdiabetes, Type 2  Renal/GU negative Renal ROS  negative genitourinary   Musculoskeletal negative musculoskeletal ROS (+)   Abdominal   Peds negative pediatric ROS (+)  Hematology negative hematology ROS (+)   Anesthesia Other Findings   Reproductive/Obstetrics negative OB ROS                             Anesthesia Physical Anesthesia Plan  ASA: III  Anesthesia Plan: General   Post-op Pain Management:    Induction: Intravenous  PONV Risk Score and Plan: 2 and Ondansetron and Midazolam  Airway Management Planned: Oral ETT  Additional Equipment:   Intra-op Plan:   Post-operative Plan: Extubation in OR  Informed Consent: I have reviewed the patients History and Physical, chart, labs and discussed the procedure including the risks, benefits and alternatives for the proposed anesthesia with the patient or authorized representative who has indicated his/her understanding and acceptance.   Dental advisory given  Plan Discussed with: CRNA  Anesthesia Plan Comments:         Anesthesia Quick Evaluation

## 2018-09-27 NOTE — Op Note (Signed)
[facility] Patient Name: Matthew Schultz Procedure Date : 09/27/2018 MRN: [id number] Attending MD: Clarene Essex , MD Date of Birth: 1932/04/13 CSN: [id number] Age: 84 Admit Type: Inpatient Procedure:                ERCP Indications:              Bile duct stone(s) on CT and MRCP elevated liver                            tests Providers:                Clarene Essex, MD, Cleda Daub, RN, Alan Mulder,                            Technician, Vania Rea, CRNA Referring MD:              Medicines:                General Anesthesia Complications:            No immediate complications. Estimated Blood Loss:     Estimated blood loss: none. Procedure:                Pre-Anesthesia Assessment:                           - Prior to the procedure, a History and Physical                            was performed, and patient medications and                            allergies were reviewed. The patient's tolerance of                            previous anesthesia was also reviewed. The risks                            and benefits of the procedure and the sedation                            options and risks were discussed with the patient.                            All questions were answered, and informed consent                            was obtained. Prior Anticoagulants: The patient has                            taken heparin, last dose was day of procedure. ASA                            Grade Assessment: III - A patient with severe  systemic disease. After reviewing the risks and                            benefits, the patient was deemed in satisfactory                            condition to undergo the procedure.                           After obtaining informed consent, the scope was                            passed under direct vision. Throughout the                            procedure, the patient's blood pressure, pulse, and               oxygen saturations were monitored continuously. The                            [id number]-[id number] ([id number]) Olympus Duodensocope was                            introduced through the mouth, and used to inject                            contrast into and used to cannulate the bile duct.                            The ERCP was accomplished without difficulty. The                            patient tolerated the procedure well. Scope In: Scope Out: Findings:      The major papilla was normal. The minor papilla was congested. deep       selective cannulation was made on the first attempt and there was no       pancreatic duct injection required faint enhancement throughout the       procedure and obvious stones were seen on the initial cholangiogram and       we proceeded with a Biliary sphincterotomy which was made with a       Hydratome sphincterotome using ERBE electrocautery in the customary       fashion until he had adequate biliary drainage and could get the fully       bowed sphincterotome easily in and out of the duct. There was no       post-sphincterotomy bleeding. The biliary tree was swept with an       adjustable 12- 15 mm balloon starting at the bifurcation. Three stones       were removed using the 12 mm balloon.we then swept the duct 3 times with       the 15 mm balloon and both balloons passed readily through the patent       sphincterotomy site and we proceeded with an occlusion cholangiogram in       the customary fashion and No stones remained. there was  adequate biliary       drainage at the end of the procedure and the wire balloon and scope were       removed and the patient tolerated the procedure well Impression:               - The major papilla appeared normal.                           - The minor papilla appeared congested.                           - Choledocholithiasis was found. Complete removal                            was accomplished by biliary  sphincterotomy and                            balloon extraction.                           - A biliary sphincterotomy was performed.                           - The biliary tree was swept. Moderate Sedation:      moderate sedation-none Recommendation:           - Clear liquid diet for 6 hours. if doing well may                            have soft solids this evening                           - Continue present medications.                           - Check liver enzymes (AST, ALT, alkaline                            phosphatase, bilirubin) and hemogram with white                            blood cell count and platelets in the morning.                           - Return to GI clinic PRN.                           - Telephone GI clinic if symptomatic PRN.                           - Refer to a surgeon today. consider laparoscopic                            cholecystectomy later this week Procedure Code(s):        --- Professional ---  [phone], Endoscopic retrograde                            cholangiopancreatography (ERCP); with removal of                            calculi/debris from biliary/pancreatic duct(s)                           43262, Endoscopic retrograde                            cholangiopancreatography (ERCP); with                            sphincterotomy/papillotomy Diagnosis Code(s):        --- Professional ---                           K80.50, Calculus of bile duct without cholangitis                            or cholecystitis without obstruction CPT copyright 2017 [employer]. All rights reserved. The codes documented in this report are preliminary and upon coder review may  be revised to meet current compliance requirements. Clarene Essex, MD 09/27/2018 8:30:22 AM This report has been signed electronically. Number of Addenda: 0

## 2018-09-27 NOTE — Progress Notes (Signed)
Matthew Schultz 7:35 AM  Subjective: Patient doing well this morning without any obvious complaints still a little confused and I rediscussed the procedure with him in my nurse discussed the procedure with his wife yesterday on the phone  Objective: Vital signs stable afebrile no acute distress exam please see preassessment evaluationno new chemistries or CBC today  Assessment: CBD stones  Plan: Okay to proceed with ERCP with anesthesia assistance  [facility]  Pager [phone] After 5PM or if no answer call [phone]

## 2018-09-28 ENCOUNTER — Encounter (HOSPITAL_COMMUNITY): Payer: Self-pay | Admitting: Certified Registered"

## 2018-09-28 DIAGNOSIS — R6520 Severe sepsis without septic shock: Secondary | ICD-10-CM

## 2018-09-28 DIAGNOSIS — N170 Acute kidney failure with tubular necrosis: Secondary | ICD-10-CM

## 2018-09-28 LAB — COMPREHENSIVE METABOLIC PANEL
ALBUMIN: 2.4 g/dL — AB (ref 3.5–5.0)
ALT: 92 U/L — AB (ref 0–44)
AST: 33 U/L (ref 15–41)
Alkaline Phosphatase: 150 U/L — ABNORMAL HIGH (ref 38–126)
Anion gap: 4 — ABNORMAL LOW (ref 5–15)
BUN: 20 mg/dL (ref 8–23)
CALCIUM: 7.4 mg/dL — AB (ref 8.9–10.3)
CHLORIDE: 107 mmol/L (ref 98–111)
CO2: 22 mmol/L (ref 22–32)
CREATININE: 1.03 mg/dL (ref 0.61–1.24)
GFR calc Af Amer: >60 mL/min (ref >60)
GFR calc non Af Amer: >60 mL/min (ref >60)
GLUCOSE: 205 mg/dL — AB (ref 70–99)
Potassium: 3.3 mmol/L — ABNORMAL LOW (ref 3.5–5.1)
Sodium: 133 mmol/L — ABNORMAL LOW (ref 135–145)
Total Bilirubin: 1.2 mg/dL (ref 0.3–1.2)
Total Protein: 5.5 g/dL — ABNORMAL LOW (ref 6.5–8.1)

## 2018-09-28 LAB — GLUCOSE, CAPILLARY
GLUCOSE-CAPILLARY: 111 mg/dL — AB (ref 70–99)
GLUCOSE-CAPILLARY: 113 mg/dL — AB (ref 70–99)
GLUCOSE-CAPILLARY: 105 mg/dL — AB (ref 70–99)
Glucose-Capillary: 165 mg/dL — ABNORMAL HIGH (ref 70–99)

## 2018-09-28 LAB — CBC WITH DIFFERENTIAL/PLATELET
ABS IMMATURE GRANULOCYTES: 0.1 10*3/uL (ref 0.0–0.1)
BASOS PCT: 0 %
Basophils Absolute: 0 10*3/uL (ref 0.0–0.1)
Eosinophils Absolute: 0 10*3/uL (ref 0.0–0.7)
Eosinophils Relative: 0 %
HEMATOCRIT: 35.3 % — AB (ref 39.0–52.0)
Hemoglobin: 12 g/dL — ABNORMAL LOW (ref 13.0–17.0)
IMMATURE GRANULOCYTES: 1 %
LYMPHS ABS: 0.6 10*3/uL — AB (ref 0.7–4.0)
Lymphocytes Relative: 7 %
MCH: 30.6 pg (ref 26.0–34.0)
MCHC: 34 g/dL (ref 30.0–36.0)
MCV: 90.1 fL (ref 78.0–100.0)
MONO ABS: 0.6 10*3/uL (ref 0.1–1.0)
Monocytes Relative: 7 %
NEUTROS ABS: 7.3 10*3/uL (ref 1.7–7.7)
Neutrophils Relative %: 85 %
PLATELETS: 140 10*3/uL — AB (ref 150–400)
RBC: 3.92 MIL/uL — ABNORMAL LOW (ref 4.22–5.81)
RDW: 13.1 % (ref 11.5–15.5)
WBC: 8.5 10*3/uL (ref 4.0–10.5)

## 2018-09-28 MED ORDER — RISPERIDONE 0.5 MG PO TABS
0.2500 mg | ORAL_TABLET | Freq: Every day | ORAL | Status: DC
  Administered 2018-09-28 – 2018-09-29 (×2): 0.25 mg via ORAL
  Filled 2018-09-28 (×2): qty 1

## 2018-09-28 MED ORDER — CEFAZOLIN SODIUM-DEXTROSE 1-4 GM/50ML-% IV SOLN
1.0000 g | Freq: Three times a day (TID) | INTRAVENOUS | Status: DC
  Administered 2018-09-28 – 2018-09-29 (×4): 1 g via INTRAVENOUS
  Filled 2018-09-28 (×4): qty 50

## 2018-09-28 MED ORDER — HEPARIN SODIUM (PORCINE) 5000 UNIT/ML IJ SOLN
5000.0000 [IU] | Freq: Three times a day (TID) | INTRAMUSCULAR | Status: DC
  Administered 2018-09-29 – 2018-10-05 (×19): 5000 [IU] via SUBCUTANEOUS
  Filled 2018-09-28 (×17): qty 1

## 2018-09-28 MED ORDER — POTASSIUM CHLORIDE 10 MEQ/100ML IV SOLN
10.0000 meq | INTRAVENOUS | Status: AC
  Administered 2018-09-28 (×2): 10 meq via INTRAVENOUS
  Filled 2018-09-28 (×2): qty 100

## 2018-09-28 NOTE — Progress Notes (Signed)
Pharmacy Antibiotic Note  Matthew Schultz is a 84 y.o. male admitted on 09/25/2018 with AMS, abd ultrasound showed some mild gallbladder wall thickening. Pharmacy is consulted to start zosyn for cholangitis. Pt has been changed to ceftriaxone when the BCID came back. The e.coli is pan sens so Dr. Sloan Leiter is ok with change to Ancef.   Blood culture - E.coli - pan sens  Plan: Ancef 1g IV q8  Height: 5\' 6"  (167.6 cm) Weight: 163 lb 9.3 oz (74.2 kg) IBW/kg (Calculated) : 63.8  Temp (24hrs), Avg:97.7 F (36.5 C), Min:97.5 F (36.4 C), Max:98 F (36.7 C)  Recent Labs  Lab 09/25/18 1411 09/25/18 1441 09/25/18 1950 09/26/18 0350 09/26/18 0356 09/28/18 0407  WBC 20.3*  --   --  15.4*  --  8.5  CREATININE 1.28*  --   --   --  1.03 1.03  LATICACIDVEN  --  5.1* 4.1* 1.7  --   --     Estimated Creatinine Clearance: 48.2 mL/min (by C-G formula based on SCr of 1.03 mg/dL).    No Known Allergies  Antimicrobials this admission: zosyn 9/29 >>10/1 Vancomycin 9/29 x 1  Ceftriaxone 10/1 Ancef 1g IV q8 10/2>>  Dose adjustments this admission:   Microbiology results: 9/29 BCx: 2/2 GNR, BCID - e.coli - pan sens   Onnie Boer, PharmD, [place], AAHIVP, CPP Infectious Disease Pharmacist Pager: [phone] 09/28/2018 12:13 PM

## 2018-09-28 NOTE — Plan of Care (Signed)
  Problem: Clinical Measurements: Goal: Respiratory complications will improve Outcome: Progressing   Problem: Nutrition: Goal: Adequate nutrition will be maintained Outcome: Progressing   

## 2018-09-28 NOTE — Evaluation (Signed)
Occupational Therapy Evaluation Patient Details Name: Matthew Schultz MRN: [id number] DOB: [birth year] Today's Date: 09/28/2018    History of Present Illness 84 yo male with BPH, CAD, DM, HTN, arthritis, renal mass, CVA presented to [facility] with altered mental status and sepsis.  CT confirmed choledocholithiasis with two obstructing stones and signs of cholangitis.  He was transferred to [facility] and underwent ERCP 10/1. Awaiting sx - probably 10/3   Clinical Impression   Pt unreliable historian this session - disoriented to time, place, situation - pleasantly confused. Able to follow simple one step commands 25% of the time demonstrating decreased initiation requires tactile cues. Pt was max A +2 for bed mobility and max A +2 for stand pivot transfer to recliner simulating toilet transfer. No family present at this time to report prior levels or assist with home set up. Vision is impaired and will require further testing. OT will continue to follow acutely and Pt will require SNF level care at discharge to maximize safety and independence in ADL and transfers. Next session to work on [place] task and potentially standing balance.     Follow Up Recommendations  SNF;Supervision/Assistance - 24 hour    Equipment Recommendations  Other (comment)(defer to next venue)    Recommendations for Other Services       Precautions / Restrictions Precautions Precautions: Fall Restrictions Weight Bearing Restrictions: No      Mobility Bed Mobility Overal bed mobility: Needs Assistance Bed Mobility: Supine to Sit     Supine to sit: Max assist;+2 for physical assistance;+2 for safety/equipment     General bed mobility comments: assist to bring BLE to EOB, max A for trunk elevation as well as use of pad to bring hips to the EOB  Transfers Overall transfer level: Needs assistance Equipment used: Rolling walker (2 wheeled) Transfers: Sit to/from [facility] Sit to Stand: Mod assist;+2  physical assistance Stand pivot transfers: Max assist;+2 physical assistance;+2 safety/equipment       General transfer comment: assist for boost, max A for control and pivot -in the future recommend not completing with the RW - he requiref assist to control it and it just got in the way    Balance Overall balance assessment: Needs assistance Sitting-balance support: Bilateral upper extremity supported;Feet supported Sitting balance-Leahy Scale: Fair Sitting balance - Comments: started max A and progressed to min guard - fatigues quickly Postural control: Posterior lean Standing balance support: Bilateral upper extremity supported Standing balance-Leahy Scale: Poor Standing balance comment: dependent on therapist assist and watch for posterior lean                           ADL either performed or assessed with clinical judgement   ADL Overall ADL's : Needs assistance/impaired Eating/Feeding: NPO   Grooming: Wash/dry face;Min guard;Sitting   Upper Body Bathing: Moderate assistance   Lower Body Bathing: Maximal assistance   Upper Body Dressing : Moderate assistance   Lower Body Dressing: Maximal assistance   Toilet Transfer: Maximal assistance;+2 for physical assistance;Stand-pivot;RW Toilet Transfer Details (indicate cue type and reason): simulated through recliner transfer - Pt currently with condom cath Toileting- Clothing Manipulation and Hygiene: Total assistance       Functional mobility during ADLs: Maximal assistance;+2 for physical assistance;+2 for safety/equipment;Rolling walker(SPT only this session)       Vision Patient Visual Report: Other (comment)(Pt unable to report prior vision) Vision Assessment?: Vision impaired- to be further tested in functional context Additional Comments:  Pt with right eye, directed in towards nose, unable to report how many finger even with one eye covered (tried left and right), pt not in a place cognitively to assess  vision     Perception     Praxis      Pertinent Vitals/Pain Pain Assessment: No/denies pain     Hand Dominance     Extremity/Trunk Assessment Upper Extremity Assessment Upper Extremity Assessment: Generalized weakness   Lower Extremity Assessment Lower Extremity Assessment: Defer to PT evaluation   Cervical / Trunk Assessment Cervical / Trunk Assessment: Kyphotic   Communication Communication Communication: HOH   Cognition Arousal/Alertness: Awake/alert Behavior During Therapy: Restless Overall Cognitive Status: No family/caregiver present to determine baseline cognitive functioning Area of Impairment: Orientation;Attention;Memory;Following commands;Safety/judgement;Awareness;Problem solving                 Orientation Level: Disoriented to;Place;Time;Situation Current Attention Level: Focused Memory: Decreased recall of precautions;Decreased short-term memory Following Commands: Follows one step commands with increased time;Follows one step commands inconsistently Safety/Judgement: Decreased awareness of safety;Decreased awareness of deficits Awareness: Intellectual Problem Solving: Decreased initiation;Requires verbal cues;Requires tactile cues General Comments: Pt most successful with short simple cues (able to follow 25% of the time) required tactile cues and task initiation 100% of the time   General Comments  Pt completely and pleasantly confused throughout session. "I've got lots to go today" thought he was sitting at home on his front porch. Vision requires further assessment    Exercises     Shoulder Instructions      Home Living Family/patient expects to be discharged to:: Unsure                                 Additional Comments: Pt is not able to answer home living questions at this time      Prior Functioning/Environment          Comments: unsure of prior levels as Pt unable to report and no family present at time of eval         OT Problem List: Decreased activity tolerance;Impaired balance (sitting and/or standing);Impaired vision/perception;Decreased coordination;Decreased cognition;Decreased safety awareness;Decreased knowledge of use of DME or AE;Decreased knowledge of precautions      OT Treatment/Interventions: Self-care/ADL training;DME and/or AE instruction;Therapeutic activities;Cognitive remediation/compensation;Visual/perceptual remediation/compensation;Patient/family education;Balance training    OT Goals(Current goals can be found in the care plan section) Acute Rehab OT Goals Patient Stated Goal: none stated ADL Goals Pt Will Perform Grooming: with supervision;sitting Pt Will Transfer to Toilet: with mod assist;stand pivot transfer;bedside commode Pt Will Perform Toileting - Clothing Manipulation and hygiene: with mod assist;sit to/from stand Additional ADL Goal #1: Pt will follow one step commands for ADLs at 75% success rate  OT Frequency: Min 2X/week   Barriers to D/C:            Co-evaluation PT/OT/SLP Co-Evaluation/Treatment: Yes Reason for Co-Treatment: Complexity of the patient's impairments (multi-system involvement);Necessary to address cognition/behavior during functional activity;For patient/therapist safety;To address functional/ADL transfers PT goals addressed during session: Mobility/safety with mobility;Balance;Proper use of DME OT goals addressed during session: ADL's and self-care;Proper use of Adaptive equipment and DME      AM-PAC PT "6 Clicks" Daily Activity     Outcome Measure Help from another person eating meals?: Total(NPO) Help from another person taking care of personal grooming?: A Lot Help from another person toileting, which includes using toliet, bedpan, or urinal?: A Lot Help from another person bathing (  including washing, rinsing, drying)?: A Lot Help from another person to put on and taking off regular upper body clothing?: A Lot Help from another  person to put on and taking off regular lower body clothing?: A Lot 6 Click Score: 11   End of Session Equipment Utilized During Treatment: Gait belt;Rolling walker Nurse Communication: Mobility status;Precautions;Other (comment)(needs the disc call bell while in mitts)  Activity Tolerance: Patient tolerated treatment well Patient left: in chair;with call bell/phone within reach;with chair alarm set;Other (comment);with nursing/sitter in room(telesitter)  OT Visit Diagnosis: Unsteadiness on feet (R26.81);Repeated falls (R29.6);Other abnormalities of gait and mobility (R26.89);Muscle weakness (generalized) (M62.81);Other symptoms and signs involving cognitive function                Time: 8828-0034 OT Time Calculation (min): 27 min Charges:  OT General Charges $OT Visit: 1 Visit OT Evaluation $OT Eval Moderate Complexity: [place] OTR/L Acute Rehabilitation Services Pager: [phone] Office: [place] 09/28/2018, 10:52 AM

## 2018-09-28 NOTE — Anesthesia Preprocedure Evaluation (Deleted)
Anesthesia Evaluation  Patient identified by MRN, date of birth, ID band Patient awake    Reviewed: Allergy & Precautions, NPO status , Patient's Chart, lab work & pertinent test results  Airway Mallampati: II  TM Distance: >3 FB Neck ROM: Full    Dental no notable dental hx. (+) Dental Advisory Given, Lower Dentures, Edentulous Upper   Pulmonary neg pulmonary ROS, former smoker,    Pulmonary exam normal breath sounds clear to auscultation       Cardiovascular hypertension, Pt. on medications + CAD  Normal cardiovascular exam Rhythm:Regular Rate:Normal     Neuro/Psych Dementia  Neuromuscular disease CVA negative psych ROS   GI/Hepatic negative GI ROS,   Endo/Other  diabetes, Type 2  Renal/GU   negative genitourinary   Musculoskeletal  (+) Arthritis ,   Abdominal   Peds negative pediatric ROS (+)  Hematology negative hematology ROS (+)   Anesthesia Other Findings   Reproductive/Obstetrics negative OB ROS                             Lab Results  Component Value Date   CREATININE 1.03 09/28/2018   BUN 20 09/28/2018   NA 133 (L) 09/28/2018   K 3.3 (L) 09/28/2018   CL 107 09/28/2018   CO2 22 09/28/2018   Lab Results  Component Value Date   WBC 8.5 09/28/2018   HGB 12.0 (L) 09/28/2018   HCT 35.3 (L) 09/28/2018   MCV 90.1 09/28/2018   PLT 140 (L) 09/28/2018    Anesthesia Physical Anesthesia Plan  ASA: III  Anesthesia Plan: General   Post-op Pain Management:    Induction: Intravenous  PONV Risk Score and Plan: 3 and Treatment may vary due to age or medical condition, Dexamethasone and Ondansetron  Airway Management Planned: Oral ETT  Additional Equipment:   Intra-op Plan:   Post-operative Plan:   Informed Consent: I have reviewed the patients History and Physical, chart, labs and discussed the procedure including the risks, benefits and alternatives for the proposed  anesthesia with the patient or authorized representative who has indicated his/her understanding and acceptance.     Plan Discussed with: CRNA  Anesthesia Plan Comments:         Anesthesia Quick Evaluation

## 2018-09-28 NOTE — Consult Note (Signed)
Reason for Consult:  Choledocholithiasis Referring Physician: [name] is an 84 y.o. male.  HPI: 83 yo male with BPH, CAD, DM, HTN, arthritis, renal mass, CVA presented to [facility] with altered mental status and sepsis.  CT confirmed choledocholithiasis with two obstructing stones and signs of cholangitis.  He was transferred to [facility] and underwent ERCP 10/1.  Bilirubin improving.  We are consulted for cholecystectomy.  Apparently, last night, the patient became quite agitated and pulled out his lines.  He is sedated, restrained in soft mittens with telesitter.  He is barely arousable.  Past Medical History:  Diagnosis Date  . Arthritis   . BPH (benign prostatic hyperplasia)   . CAD (coronary artery disease)   . DM (diabetes mellitus) ([place])   . HLD (hyperlipidemia)   . HTN (hypertension)   . Hydronephrosis   . Incomplete bladder emptying   . OA (osteoarthritis)   . Renal mass   . Stroke [facility])     Past Surgical History:  Procedure Laterality Date  . BACK SURGERY      Family History  Problem Relation Age of Onset  . Pneumonia Father   . Kidney cancer Neg Hx   . Kidney disease Neg Hx   . Prostate cancer Neg Hx     Social History:  reports that he has quit smoking. His smoking use included cigarettes. His smokeless tobacco use includes chew. He reports that he does not drink alcohol or use drugs.  Allergies: No Known Allergies  Medications:  Prior to Admission medications   Medication Sig Start Date End Date Taking? Authorizing Provider  aspirin EC 81 MG tablet Take 81 mg by mouth daily.   Yes [provider]  donepezil (ARICEPT) 5 MG tablet Take 1 tablet by mouth daily. 04/06/18  Yes [provider]  feeding supplement, ENSURE ENLIVE, (ENSURE ENLIVE) LIQD Take 237 mLs by mouth 3 (three) times daily between meals. 04/15/18  Yes Pyreddy, Reatha Harps, MD  gabapentin (NEURONTIN) 300 MG capsule Take 300 mg by mouth at bedtime.    Yes [provider]   metoprolol tartrate (LOPRESSOR) 25 MG tablet Take 1 tablet by mouth 2 (two) times daily. 03/28/18  Yes [provider]  omeprazole (PRILOSEC) 20 MG capsule Take 20 mg by mouth 2 (two) times daily.   Yes [provider]  traMADol (ULTRAM) 50 MG tablet Take 50 mg by mouth every 8 (eight) hours as needed for pain. 07/27/18  Yes [provider]  tamsulosin (FLOMAX) 0.4 MG CAPS capsule Take 1 capsule (0.4 mg total) by mouth daily. 09/07/16   Nori Riis, PA-C     Results for orders placed or performed during the hospital encounter of 09/25/18 (from the past 48 hour(s))  Glucose, capillary     Status: None   Collection Time: 09/26/18 11:18 AM  Result Value Ref Range   Glucose-Capillary 94 70 - 99 mg/dL  Glucose, capillary     Status: Abnormal   Collection Time: 09/26/18  3:12 PM  Result Value Ref Range   Glucose-Capillary 104 (H) 70 - 99 mg/dL  Glucose, capillary     Status: None   Collection Time: 09/26/18  8:24 PM  Result Value Ref Range   Glucose-Capillary 85 70 - 99 mg/dL  Glucose, capillary     Status: None   Collection Time: 09/26/18 11:34 PM  Result Value Ref Range   Glucose-Capillary 94 70 - 99 mg/dL  Glucose, capillary     Status: None  Collection Time: 09/27/18  3:47 AM  Result Value Ref Range   Glucose-Capillary 88 70 - 99 mg/dL  Procalcitonin     Status: None   Collection Time: 09/27/18  4:35 AM  Result Value Ref Range   Procalcitonin 6.09 ng/mL    Comment:        Interpretation: PCT > 2 ng/mL: Systemic infection (sepsis) is likely, unless other causes are known. (NOTE)       Sepsis PCT Algorithm           Lower Respiratory Tract                                      Infection PCT Algorithm    ----------------------------     ----------------------------         PCT < 0.25 ng/mL                PCT < 0.10 ng/mL         Strongly encourage             Strongly discourage   discontinuation of antibiotics    initiation of antibiotics     ----------------------------     -----------------------------       PCT 0.25 - 0.50 ng/mL            PCT 0.10 - 0.25 ng/mL               OR       >80% decrease in PCT            Discourage initiation of                                            antibiotics      Encourage discontinuation           of antibiotics    ----------------------------     -----------------------------         PCT >= 0.50 ng/mL              PCT 0.26 - 0.50 ng/mL               AND       <80% decrease in PCT              Encourage initiation of                                             antibiotics       Encourage continuation           of antibiotics    ----------------------------     -----------------------------        PCT >= 0.50 ng/mL                  PCT > 0.50 ng/mL               AND         increase in PCT                  Strongly encourage  initiation of antibiotics    Strongly encourage escalation           of antibiotics                                     -----------------------------                                           PCT <= 0.25 ng/mL                                                 OR                                        > 80% decrease in PCT                                     Discontinue / Do not initiate                                             antibiotics Performed at [facility], [address], [place], [place] [address]   Glucose, capillary     Status: Abnormal   Collection Time: 09/27/18  9:30 AM  Result Value Ref Range   Glucose-Capillary 106 (H) 70 - 99 mg/dL  Glucose, capillary     Status: Abnormal   Collection Time: 09/27/18 12:05 PM  Result Value Ref Range   Glucose-Capillary 136 (H) 70 - 99 mg/dL  Glucose, capillary     Status: Abnormal   Collection Time: 09/27/18  3:44 PM  Result Value Ref Range   Glucose-Capillary 211 (H) 70 - 99 mg/dL  Glucose, capillary     Status: Abnormal   Collection Time: 09/27/18  8:52 PM   Result Value Ref Range   Glucose-Capillary 154 (H) 70 - 99 mg/dL  Comprehensive metabolic panel     Status: Abnormal   Collection Time: 09/28/18  4:07 AM  Result Value Ref Range   Sodium 133 (L) 135 - 145 mmol/L   Potassium 3.3 (L) 3.5 - 5.1 mmol/L   Chloride 107 98 - 111 mmol/L   CO2 22 22 - 32 mmol/L   Glucose, Bld 205 (H) 70 - 99 mg/dL   BUN 20 8 - 23 mg/dL   Creatinine, Ser 1.03 0.61 - 1.24 mg/dL   Calcium 7.4 (L) 8.9 - 10.3 mg/dL   Total Protein 5.5 (L) 6.5 - 8.1 g/dL   Albumin 2.4 (L) 3.5 - 5.0 g/dL   AST 33 15 - 41 U/L   ALT 92 (H) 0 - 44 U/L   Alkaline Phosphatase 150 (H) 38 - 126 U/L   Total Bilirubin 1.2 0.3 - 1.2 mg/dL   GFR calc non Af Amer >60 >60 mL/Matthew   GFR calc Af Amer >60 >60 mL/Matthew    Comment: (NOTE) The eGFR has been calculated using the  CKD EPI equation. This calculation has not been validated in all clinical situations. eGFR's persistently <60 mL/Matthew signify possible Chronic Kidney Disease.    Anion gap 4 (L) 5 - 15    Comment: Performed at [facility] [address], [place], [place] [address]  CBC with Differential/Platelet     Status: Abnormal   Collection Time: 09/28/18  4:07 AM  Result Value Ref Range   WBC 8.5 4.0 - 10.5 K/uL   RBC 3.92 (L) 4.22 - 5.81 MIL/uL   Hemoglobin 12.0 (L) 13.0 - 17.0 g/dL   HCT 35.3 (L) 39.0 - 52.0 %   MCV 90.1 78.0 - 100.0 fL   MCH 30.6 26.0 - 34.0 pg   MCHC 34.0 30.0 - 36.0 g/dL   RDW 13.1 11.5 - 15.5 %   Platelets 140 (L) 150 - 400 K/uL   Neutrophils Relative % 85 %   Neutro Abs 7.3 1.7 - 7.7 K/uL   Lymphocytes Relative 7 %   Lymphs Abs 0.6 (L) 0.7 - 4.0 K/uL   Monocytes Relative 7 %   Monocytes Absolute 0.6 0.1 - 1.0 K/uL   Eosinophils Relative 0 %   Eosinophils Absolute 0.0 0.0 - 0.7 K/uL   Basophils Relative 0 %   Basophils Absolute 0.0 0.0 - 0.1 K/uL   Immature Granulocytes 1 %   Abs Immature Granulocytes 0.1 0.0 - 0.1 K/uL    Comment: Performed at [facility], [place] [address],  [place], [place] [address]  Glucose, capillary     Status: Abnormal   Collection Time: 09/28/18  8:13 AM  Result Value Ref Range   Glucose-Capillary 165 (H) 70 - 99 mg/dL   Hepatic Function Latest Ref Rng & Units 09/28/2018 09/26/2018 09/25/2018  Total Protein 6.5 - 8.1 g/dL 5.5(L) 5.9(L) 7.1  Albumin 3.5 - 5.0 g/dL 2.4(L) 2.8(L) 3.5  AST 15 - 41 U/L 33 241(H) 538(H)  ALT 0 - 44 U/L 92(H) 252(H) 319(H)  Alk Phosphatase 38 - 126 U/L 150(H) 184(H) 208(H)  Total Bilirubin 0.3 - 1.2 mg/dL 1.2 5.8(H) 5.2(H)  Bilirubin, Direct 0.1 - 0.5 mg/dL - - -     Dg Ercp Biliary & Pancreatic Ducts  Result Date: 09/27/2018 CLINICAL DATA:  Bile duct stone removal EXAM: ERCP TECHNIQUE: Multiple spot images obtained with the fluoroscopic device and submitted for interpretation post-procedure. COMPARISON:  None. FINDINGS: 2 fluoroscopic spot images document endoscopic cannulation and opacification of the CBD with passage of a balloon tipped catheter. There is incomplete opacification of the intrahepatic biliary tree, which appears decompressed centrally. IMPRESSION: Endoscopic CBD cannulation and intervention. These images were submitted for radiologic interpretation only. Please see the procedural report for the amount of contrast and the fluoroscopy time utilized. Electronically Signed   By: Lucrezia Europe M.D.   On: 09/27/2018 09:25    Review of Systems  Constitutional: Negative for weight loss.  HENT: Negative for ear discharge, ear pain, hearing loss and tinnitus.   Eyes: Negative for blurred vision, double vision, photophobia and pain.  Respiratory: Negative for cough, sputum production and shortness of breath.   Cardiovascular: Negative for chest pain.  Gastrointestinal: Positive for abdominal pain. Negative for nausea and vomiting.  Genitourinary: Negative for dysuria, flank pain, frequency and urgency.  Musculoskeletal: Negative for back pain, falls, joint pain, myalgias and neck pain.  Neurological: Negative  for dizziness, tingling, sensory change, focal weakness, loss of consciousness and headaches.  Endo/Heme/Allergies: Does not bruise/bleed easily.  Psychiatric/Behavioral: Negative for depression,  memory loss and substance abuse. The patient is not nervous/anxious.    Blood pressure 110/71, pulse 61, temperature 98 F (36.7 C), temperature source Oral, resp. rate (!) 21, height '5\' 6"'$  (1.676 m), weight 74.2 kg, SpO2 96 %. Physical Exam WDWN; sedated, barely arousable, no distress Eyes:  Pupils equal, round; sclera anicteric HENT:  Oral mucosa moist; good dentition  Neck:  No masses palpated, no thyromegaly Lungs:  CTA bilaterally; normal respiratory effort CV:  Regular rate and rhythm; no murmurs; extremities well-perfused with no edema Abd:  +bowel sounds, soft, non-tender Skin:  Warm, dry; no sign of jaundice Psychiatric - alert and oriented x 4; calm mood and affect  Assessment/Plan: Choledocholithiasis - s/p ERCP Needs lap chole Currently disoriented, heavily sedated. Will wait one more day before surgery to allow some improvement in medical status/ mental status. Probable lap chole tomorrow.  Imogene Burn Keyanni Whittinghill 09/28/2018, 8:22 AM

## 2018-09-28 NOTE — Progress Notes (Signed)
PROGRESS NOTE        PATIENT DETAILS Name: Matthew Schultz Age: 84 y.o. Sex: male Date of Birth: [birth year] Admit Date: 09/25/2018 Admitting Physician Tanda Rockers, MD [id number], [name], MD  Brief Narrative: Patient is a 84 y.o. male with a history of BPH, CAD, DM, dyslipidemia presented with altered mental status, patient was found to have sepsis secondary to cholangitis and E. coli bacteremia.  He underwent ERCP-and is clinically improved.  General surgery and GI following-tentatively scheduled for laparoscopic cholecystectomy in the next day or so.  Subjective: Awake-slightly confused this morning.  Denies any abdominal pain.  Assessment/Plan: Sepsis secondary to acute cholangitis and E. coli bacteremia: Sepsis pathology has markedly improved-status post ERCP on 10/1, general surgery consulted for laparoscopic cholecystectomy.  Continue with laparoscopic cholecystectomy.  Dementia with delirium: Markedly confused last night-slowly come around this morning-Per General surgery-needs to be a little more cooperative before considering lap scopic cholecystectomy.  Will start low-dose Risperdal and see how he does.  ?  Dysphagia: Gurgling/accumulating secretions this morning-will keep n.p.o. and have speech therapy evaluate.  Acute kidney injury: Likely hemodynamically mediated-resolved  Hypokalemia: Replete and recheck  GERD: Continue PPI  BPH: Stable-continue Flomax  DM-2: CBG stable with SSI follow and adjust accordingly  Incidental right adrenal adenoma: Stable for outpatient follow-up  Deconditioning/debility: Worsened by acute illness-we will need PT evaluation-probably will need SNF on discharge.  DVT Prophylaxis: Prophylactic Heparin   Code Status: Full code   Family Communication: None at bedside  Disposition Plan: Remain inpatient-SNF at discharge-hopefully later this week  Antimicrobial agents: Anti-infectives (From admission,  onward)   Start     Dose/Rate Route Frequency Ordered Stop   09/28/18 1400  ceFAZolin (ANCEF) IVPB 1 g/50 mL premix     1 g 100 mL/hr over 30 Minutes Intravenous Every 8 hours 09/28/18 1212     09/27/18 1700  cefTRIAXone (ROCEPHIN) 2 g in sodium chloride 0.9 % 100 mL IVPB  Status:  Discontinued     2 g 200 mL/hr over 30 Minutes Intravenous Every 24 hours 09/27/18 1625 09/28/18 1212   09/26/18 0600  piperacillin-tazobactam (ZOSYN) IVPB 3.375 g  Status:  Discontinued     3.375 g 12.5 mL/hr over 240 Minutes Intravenous Every 8 hours 09/26/18 0411 09/27/18 1625      Procedures: 10/1>> ERCP  CONSULTS:  GI and general surgery  Time spent: 25- minutes-Greater than 50% of this time was spent in counseling, explanation of diagnosis, planning of further management, and coordination of care.  MEDICATIONS: Scheduled Meds: . aspirin EC  81 mg Oral Daily  . donepezil  5 mg Oral Daily  . gabapentin  300 mg Oral QHS  . [START ON 09/29/2018] heparin  5,000 Units Subcutaneous Q8H  . insulin aspart  0-5 Units Subcutaneous QHS  . insulin aspart  0-9 Units Subcutaneous TID WC  . pantoprazole  40 mg Oral Daily  . tamsulosin  0.4 mg Oral Daily   Continuous Infusions: . sodium chloride 1,000 mL (09/28/18 1259)  .  ceFAZolin (ANCEF) IV 1 g (09/28/18 1301)   PRN Meds:.   PHYSICAL EXAM: Vital signs: Vitals:   09/27/18 2055 09/28/18 0449 09/28/18 0500 09/28/18 1331  BP: 129/76 110/71  133/75  Pulse: 79 61  (!) 52  Resp: 19 (!) 21  18  Temp: (!) 97.5 F (36.4 C) 98 F (  36.7 C)  98.2 F (36.8 C)  TempSrc: Oral Oral  Oral  SpO2: 96% 96%  99%  Weight:   74.2 kg   Height:       Filed Weights   09/27/18 0709 09/27/18 1829 09/28/18 0500  Weight: 69.5 kg 73.8 kg 74.2 kg   Body mass index is 26.4 kg/m.   General appearance :Awake, slightly confused.  Not in any distress Eyes:, pupils equally reactive to light and accomodation,no scleral icterus. HEENT: Atraumatic and Normocephalic Neck:  supple, no JVD. No cervical lymphadenopathy.  Resp:Good air entry bilaterally, no added sounds  CVS: S1 S2 regular, no murmurs.  GI: Bowel sounds present, Non tender and not distended with no gaurding, rigidity or rebound. Extremities: B/L Lower Ext shows no edema, both legs are warm to touch Neurology: Nonfocal  musculoskeletal:No digital cyanosis Skin:No Rash, warm and dry Wounds:N/A  I have personally reviewed following labs and imaging studies  LABORATORY DATA: CBC: Recent Labs  Lab 09/25/18 1411 09/26/18 0350 09/28/18 0407  WBC 20.3* 15.4* 8.5  NEUTROABS 18.8*  --  7.3  HGB 13.4 12.4* 12.0*  HCT 38.1* 36.9* 35.3*  MCV 89.1 91.1 90.1  PLT 178 165 140*    Basic Metabolic Panel: Recent Labs  Lab 09/25/18 1411 09/26/18 0356 09/28/18 0407  NA 140 139 133*  K 3.5 3.5 3.3*  CL 104 110 107  CO2 21* 20* 22  GLUCOSE 219* 144* 205*  BUN 29* 26* 20  CREATININE 1.28* 1.03 1.03  CALCIUM 8.6* 8.0* 7.4*  MG  --  2.2  --     GFR: Estimated Creatinine Clearance: 48.2 mL/min (by C-G formula based on SCr of 1.03 mg/dL).  Liver Function Tests: Recent Labs  Lab 09/25/18 1411 09/26/18 0356 09/28/18 0407  AST 538* 241* 33  ALT 319* 252* 92*  ALKPHOS 208* 184* 150*  BILITOT 5.2* 5.8* 1.2  PROT 7.1 5.9* 5.5*  ALBUMIN 3.5 2.8* 2.4*   No results for input(s): LIPASE, AMYLASE in the last 168 hours. No results for input(s): AMMONIA in the last 168 hours.  Coagulation Profile: No results for input(s): INR, PROTIME in the last 168 hours.  Cardiac Enzymes: Recent Labs  Lab 09/25/18 1411  TROPONINI <0.03    BNP (last 3 results) No results for input(s): PROBNP in the last 8760 hours.  HbA1C: No results for input(s): HGBA1C in the last 72 hours.  CBG: Recent Labs  Lab 09/27/18 1205 09/27/18 1544 09/27/18 2052 09/28/18 0813 09/28/18 1213  GLUCAP 136* 211* 154* 165* 111*    Lipid Profile: No results for input(s): CHOL, HDL, LDLCALC, TRIG, CHOLHDL, LDLDIRECT  in the last 72 hours.  Thyroid Function Tests: Recent Labs    09/26/18 0350  TSH 1.435    Anemia Panel: No results for input(s): VITAMINB12, FOLATE, FERRITIN, TIBC, IRON, RETICCTPCT in the last 72 hours.  Urine analysis:    Component Value Date/Time   COLORURINE AMBER (A) 09/25/2018 1440   APPEARANCEUR CLEAR (A) 09/25/2018 1440   APPEARANCEUR Clear 11/28/2013 1712   LABSPEC 1.019 09/25/2018 1440   LABSPEC 1.019 11/28/2013 1712   PHURINE 6.0 09/25/2018 1440   GLUCOSEU 50 (A) 09/25/2018 1440   GLUCOSEU Negative 11/28/2013 1712   HGBUR NEGATIVE 09/25/2018 1440   BILIRUBINUR SMALL (A) 09/25/2018 1440   BILIRUBINUR Negative 11/28/2013 1712   KETONESUR NEGATIVE 09/25/2018 1440   PROTEINUR 30 (A) 09/25/2018 1440   NITRITE NEGATIVE 09/25/2018 1440   LEUKOCYTESUR NEGATIVE 09/25/2018 1440   LEUKOCYTESUR Negative 11/28/2013 1712  Sepsis Labs: Lactic Acid, Venous    Component Value Date/Time   LATICACIDVEN 1.7 09/26/2018 0350    MICROBIOLOGY: Recent Results (from the past 240 hour(s))  Culture, blood (routine x 2)     Status: Abnormal (Preliminary result)   Collection Time: 09/25/18  2:41 PM  Result Value Ref Range Status   Specimen Description   Final    BLOOD RAC Performed at [facility], [address] [place], [place] [address]    Special Requests   Final    BOTTLES DRAWN AEROBIC AND ANAEROBIC Blood Culture adequate volume Performed at [facility], [address] [place], [place] [address]    Culture  Setup Time   Final    GRAM NEGATIVE RODS IN BOTH AEROBIC AND ANAEROBIC BOTTLES CRITICAL RESULT CALLED TO, READ BACK BY AND VERIFIED WITH: DAVID BESANTI AT 1607 09/26/18.PMH Performed at [facility], [place] [address], [place], [place] [address]    Culture ESCHERICHIA COLI (A)  Final   Report Status PENDING  Incomplete   Organism ID, Bacteria ESCHERICHIA COLI  Final      Susceptibility   Escherichia coli - MIC*    AMPICILLIN <=2  SENSITIVE Sensitive     CEFAZOLIN <=4 SENSITIVE Sensitive     CEFEPIME <=1 SENSITIVE Sensitive     CEFTAZIDIME <=1 SENSITIVE Sensitive     CEFTRIAXONE <=1 SENSITIVE Sensitive     CIPROFLOXACIN <=0.25 SENSITIVE Sensitive     GENTAMICIN <=1 SENSITIVE Sensitive     IMIPENEM <=0.25 SENSITIVE Sensitive     TRIMETH/SULFA <=20 SENSITIVE Sensitive     AMPICILLIN/SULBACTAM <=2 SENSITIVE Sensitive     PIP/TAZO <=4 SENSITIVE Sensitive     Extended ESBL NEGATIVE Sensitive     * ESCHERICHIA COLI  Culture, blood (routine x 2)     Status: Abnormal (Preliminary result)   Collection Time: 09/25/18  2:41 PM  Result Value Ref Range Status   Specimen Description   Final    BLOOD L AC Performed at [facility], [address] [place], [place] [address]    Special Requests   Final    BOTTLES DRAWN AEROBIC AND ANAEROBIC Blood Culture adequate volume Performed at [facility], [address] [place], [place] [address]    Culture  Setup Time   Final    GRAM NEGATIVE RODS IN BOTH AEROBIC AND ANAEROBIC BOTTLES CRITICAL RESULT CALLED TO, READ BACK BY AND VERIFIED WITH: DAVID BESANTI AT 0350 09/26/18.PMH Performed at [facility], [address] [place], [place] [address]    Culture (A)  Final    ESCHERICHIA COLI SUSCEPTIBILITIES PERFORMED ON PREVIOUS CULTURE WITHIN THE LAST 5 DAYS. Performed at [facility], [place] [address] [place] [address], [place], [place] [address]    Report Status PENDING  Incomplete  Blood Culture ID Panel (Reflexed)     Status: Abnormal   Collection Time: 09/25/18  2:41 PM  Result Value Ref Range Status   Enterococcus species NOT DETECTED NOT DETECTED Final   Listeria monocytogenes NOT DETECTED NOT DETECTED Final   Staphylococcus species NOT DETECTED NOT DETECTED Final   Staphylococcus aureus NOT DETECTED NOT DETECTED Final   Streptococcus species NOT DETECTED NOT DETECTED Final   Streptococcus agalactiae NOT DETECTED NOT DETECTED Final   Streptococcus  pneumoniae NOT DETECTED NOT DETECTED Final   Streptococcus pyogenes NOT DETECTED NOT DETECTED Final   Acinetobacter baumannii NOT DETECTED NOT DETECTED Final   Enterobacteriaceae species DETECTED (A) NOT DETECTED Final    Comment:  Enterobacteriaceae represent a large family of gram-negative bacteria, not a single organism. CRITICAL RESULT CALLED TO, READ BACK BY AND VERIFIED WITH: DAVID BESANTI AT 6269 09/26/18.PMH    Enterobacter cloacae complex NOT DETECTED NOT DETECTED Final   Escherichia coli DETECTED (A) NOT DETECTED Final    Comment: CRITICAL RESULT CALLED TO, READ BACK BY AND VERIFIED WITH: DAVID BESANTI AT 4854 09/26/18.PMH    Klebsiella oxytoca NOT DETECTED NOT DETECTED Final   Klebsiella pneumoniae NOT DETECTED NOT DETECTED Final   Proteus species NOT DETECTED NOT DETECTED Final   Serratia marcescens NOT DETECTED NOT DETECTED Final   Carbapenem resistance NOT DETECTED NOT DETECTED Final   Haemophilus influenzae NOT DETECTED NOT DETECTED Final   Neisseria meningitidis NOT DETECTED NOT DETECTED Final   Pseudomonas aeruginosa NOT DETECTED NOT DETECTED Final   Candida albicans NOT DETECTED NOT DETECTED Final   Candida glabrata NOT DETECTED NOT DETECTED Final   Candida krusei NOT DETECTED NOT DETECTED Final   Candida parapsilosis NOT DETECTED NOT DETECTED Final   Candida tropicalis NOT DETECTED NOT DETECTED Final    Comment: Performed at [facility], [address] [place], [place] [address]  MRSA PCR Screening     Status: None   Collection Time: 09/25/18 11:18 PM  Result Value Ref Range Status   MRSA by PCR NEGATIVE NEGATIVE Final    Comment:        The GeneXpert MRSA Assay (FDA approved for NASAL specimens only), is one component of a comprehensive MRSA colonization surveillance program. It is not intended to diagnose MRSA infection nor to guide or monitor treatment for MRSA infections. Performed at [facility], [place] [address] [place], [place]  [address]     RADIOLOGY STUDIES/RESULTS: Ct Head Wo Contrast  Result Date: 09/25/2018 CLINICAL DATA:  Altered LOC EXAM: CT HEAD WITHOUT CONTRAST TECHNIQUE: Contiguous axial images were obtained from the base of the skull through the vertex without intravenous contrast. COMPARISON:  MRI brain 11/29/2013, CT brain 11/28/2013 FINDINGS: Brain: No acute territorial infarction, hemorrhage or intracranial mass is visualized. Moderate-to-marked atrophy. Moderate small vessel ischemic changes of the white matter. Stable ventricle size. Vascular: No hyperdense vessel.  Carotid vascular calcification Skull: No fracture. Stable heterogenous lucency in the right parietal bone. Sinuses/Orbits: No acute finding. Other: None IMPRESSION: 1. No CT evidence for acute intracranial abnormality. 2. Atrophy with small vessel ischemic changes of the white matter. Electronically Signed   By: Donavan Foil M.D.   On: 09/25/2018 15:00   Ct Abdomen Pelvis W Contrast  Result Date: 09/25/2018 CLINICAL DATA:  Right-sided abdominal pain. EXAM: CT ABDOMEN AND PELVIS WITH CONTRAST TECHNIQUE: Multidetector CT imaging of the abdomen and pelvis was performed using the standard protocol following bolus administration of intravenous contrast. CONTRAST:  150mL ISOVUE-300 IOPAMIDOL (ISOVUE-300) INJECTION 61% COMPARISON:  Ultrasound September 25, 2018 and CT scan June 26, 2014 FINDINGS: Lower chest: No acute abnormality. Hepatobiliary: Intra and extrahepatic biliary duct dilatation is identified. The common bile duct measures up to 2 cm. At least 2 filling defects are seen in the distal common bile duct with the largest measuring 14 mm on coronal image 41 and a smaller more distal apparent defect measuring 9 mm on coronal image 43. There is distention of the gallbladder. No liver mass identified. Portal vein is patent. Pancreas: Unremarkable. No pancreatic ductal dilatation or surrounding inflammatory changes. Spleen: Normal in size without focal  abnormality. Adrenals/Urinary Tract: The left adrenal gland is normal. A low-attenuation mass in the right  adrenal gland was noted to be adenoma on previous imaging. This adenoma measures nearly 2 cm. There is a small cyst in the left kidney which is otherwise normal. The left ureter is normal. A focus of air in the bladder is likely from recent catheterization. The bladder is otherwise normal. The patient has a right pelvic kidney with at least 1 cysts and severe hydronephrosis, unchanged. The ureter is not dilated. There is mild excretion into the dilated collecting system on delayed images. Stomach/Bowel: The stomach and small bowel are normal. The colon and visualized portions of the appendix are normal. Vascular/Lymphatic: Atherosclerotic changes are seen in the nonaneurysmal aorta. No adenopathy. Reproductive: Prostate is unremarkable. Other: No abdominal wall hernia or abnormality. No abdominopelvic ascites. Musculoskeletal: Anterior wedging of L3 is new since 2016. No other acute bony abnormalities. IMPRESSION: 1. Two filling defects in the distal common bile duct measuring 14 and 9 mm are consistent with choledocholithiasis resulting in intra and extrahepatic biliary duct dilatation and distention of the gallbladder. 2. 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 3. Anterior wedging of L3 is new since 2016. It is age indeterminate but I would favor the finding is nonacute. Recommend clinical correlation. 4. Right adrenal adenoma. 5. Atherosclerotic changes in the nonaneurysmal aorta. Electronically Signed   By: Dorise Bullion III M.D   On: 09/25/2018 18:34   Dg Chest Portable 1 View  Result Date: 09/25/2018 CLINICAL DATA:  Cough and congestion. EXAM: PORTABLE CHEST 1 VIEW COMPARISON:  September 25, 2018 FINDINGS: Scoliotic curvature of the thoracic spine. Atelectasis in the left base. The heart, hila, mediastinum, lungs, and pleura are  otherwise unchanged. IMPRESSION: No active disease. Electronically Signed   By: Dorise Bullion III M.D   On: 09/25/2018 18:52   Dg Chest Port 1 View  Result Date: 09/25/2018 CLINICAL DATA:  Altered mental status. EXAM: PORTABLE CHEST 1 VIEW COMPARISON:  Radiograph of October 19, 2016. FINDINGS: Stable cardiomegaly. Atherosclerosis of thoracic aorta is noted. Dextroscoliosis of thoracic spine is noted. No pneumothorax is noted. Lungs are clear. Bony thorax is unremarkable. IMPRESSION: No acute cardiopulmonary abnormality seen. Aortic Atherosclerosis (ICD10-I70.0). Electronically Signed   By: Marijo Conception, M.D.   On: 09/25/2018 14:50   Dg Ercp Biliary & Pancreatic Ducts  Result Date: 09/27/2018 CLINICAL DATA:  Bile duct stone removal EXAM: ERCP TECHNIQUE: Multiple spot images obtained with the fluoroscopic device and submitted for interpretation post-procedure. COMPARISON:  None. FINDINGS: 2 fluoroscopic spot images document endoscopic cannulation and opacification of the CBD with passage of a balloon tipped catheter. There is incomplete opacification of the intrahepatic biliary tree, which appears decompressed centrally. IMPRESSION: Endoscopic CBD cannulation and intervention. These images were submitted for radiologic interpretation only. Please see the procedural report for the amount of contrast and the fluoroscopy time utilized. Electronically Signed   By: Lucrezia Europe M.D.   On: 09/27/2018 09:25   US Abdomen Limited Ruq  Result Date: 09/25/2018 CLINICAL DATA:  Elevated LFTs EXAM: ULTRASOUND ABDOMEN LIMITED RIGHT UPPER QUADRANT COMPARISON:  None. FINDINGS: Gallbladder: Small amount of sludge. No cholelithiasis. No pericholecystic fluid. Thickened gallbladder wall measuring up to 4.5 mm. Negative sonographic Murphy sign. Common bile duct: Diameter: 8 mm distally. No obstructing mass or choledocholithiasis. Liver: No focal lesion identified. Within normal limits in parenchymal echogenicity. Portal  vein is patent on color Doppler imaging with normal direction of blood flow towards the liver. IMPRESSION: No cholelithiasis. Mild gallbladder wall thickening which  may be reactive secondary to hepatocellular disease given the elevated LFTs. No sonographic evidence of acute cholecystitis. Electronically Signed   By: Kathreen Devoid   On: 09/25/2018 16:37     LOS: 3 days   Oren Binet, MD  [facility]  If 7PM-7AM, please contact night-coverage  Please page via [email or web address] [email or web address]-click on MD name and type text message  09/28/2018, 1:59 PM

## 2018-09-28 NOTE — Progress Notes (Signed)
Pt is confused.  Constantly getting out of bed to home.  Pulled out IV and condom catheter.  He asked for daughter wanting to talk to her.  RN attempted to call daughter but no response.  Patient is placed on Avysis.  Will continue to monitor.

## 2018-09-28 NOTE — Evaluation (Signed)
Clinical/Bedside Swallow Evaluation Patient Details  Name: Matthew Schultz MRN: [id number] Date of Birth: 1932/12/01  Today's Date: 09/28/2018 Time: SLP Start Time (ACUTE ONLY): 1037 SLP Stop Time (ACUTE ONLY): 1047 SLP Time Calculation (min) (ACUTE ONLY): 10 min  Past Medical History:  Past Medical History:  Diagnosis Date  . Arthritis   . BPH (benign prostatic hyperplasia)   . CAD (coronary artery disease)   . DM (diabetes mellitus) ([place])   . HLD (hyperlipidemia)   . HTN (hypertension)   . Hydronephrosis   . Incomplete bladder emptying   . OA (osteoarthritis)   . Renal mass   . Stroke [facility])    Past Surgical History:  Past Surgical History:  Procedure Laterality Date  . BACK SURGERY     HPI:  [name] with h/o BPH, CAD, DM, HLD, HTN, OA, Renal mass, and CVA, who presented to [facility] with altered mental status and sepsis.  CT confirmed choledocholithiasis with two obstructing stones and signs of cholangitis.  He was transferred to [facility] and underwent ERCP 10/1.  CXR 9/29 without active disease.   Assessment / Plan / Recommendation Clinical Impression  Pt has audible wetness upon SLP arrival that cannot be cleared as pt does not follow commands to cough, clear his throat, etc. Spontaneous coughing is noted with >50% of boluses with ice chips and water, with cough seemingly weak and congested - not productive. No coughing is observed with purees. Given pt's risk for aspiration as well as his current mentation, recommend that he remain NPO except for ice chips after oral care and meds crushed in puree. SLP to f/u for readiness to advance diet versus need for instrumental testing. SLP Visit Diagnosis: Dysphagia, unspecified (R13.10)    Aspiration Risk  Moderate aspiration risk    Diet Recommendation NPO except meds;Ice chips PRN after oral care   Medication Administration: Crushed with puree    Other  Recommendations Oral Care Recommendations: Oral care QID   Follow up  Recommendations Skilled Nursing facility      Frequency and Duration min 2x/week  2 weeks       Prognosis Prognosis for Safe Diet Advancement: Good Barriers to Reach Goals: Cognitive deficits      Swallow Study   General HPI: [name] with h/o BPH, CAD, DM, HLD, HTN, OA, Renal mass, and CVA, who presented to [facility] with altered mental status and sepsis.  CT confirmed choledocholithiasis with two obstructing stones and signs of cholangitis.  He was transferred to [facility] and underwent ERCP 10/1.  CXR 9/29 without active disease. Type of Study: Bedside Swallow Evaluation Previous Swallow Assessment: none in chart Diet Prior to this Study: NPO Temperature Spikes Noted: No Respiratory Status: Room air History of Recent Intubation: No Behavior/Cognition: Alert;Confused;Doesn't follow directions Oral Cavity Assessment: Dry Oral Care Completed by SLP: No Oral Cavity - Dentition: Edentulous Self-Feeding Abilities: Total assist Patient Positioning: Upright in chair Baseline Vocal Quality: Wet Volitional Cough: Cognitively unable to elicit Volitional Swallow: Unable to elicit    Oral/Motor/Sensory Function Overall Oral Motor/Sensory Function: (cognitively unable to perform assessment)   Ice Chips Ice chips: Impaired Presentation: Spoon Pharyngeal Phase Impairments: Cough - Delayed   Thin Liquid Thin Liquid: Impaired Presentation: Cup;Spoon;Straw Pharyngeal  Phase Impairments: Suspected delayed Swallow;Cough - Immediate;Cough - Delayed    Nectar Thick Nectar Thick Liquid: Not tested   Honey Thick Honey Thick Liquid: Not tested   Puree Puree: Within functional limits Presentation: Spoon  Solid     Solid: Not tested      Germain Osgood 09/28/2018,11:14 AM  Germain Osgood, M.A. [place] [facility] Environmental education officer [phone] Office [phone]

## 2018-09-28 NOTE — Care Management Important Message (Signed)
Important Message  Patient Details  Name: Matthew Schultz MRN: [id number] Date of Birth: 1932/08/03   Medicare Important Message Given:  Yes    Orbie Pyo 09/28/2018, 2:27 PM

## 2018-09-28 NOTE — Progress Notes (Signed)
Patient pulled out foam dressing on his right upper arm, skin tear of 3cm x 1 cm noted on the RUE and another skin tear 1 x 1 beside the big skin tear. Area cleaned and covered with foam dressing.

## 2018-09-28 NOTE — Evaluation (Signed)
Physical Therapy Evaluation Patient Details Name: Matthew Schultz MRN: [id number] DOB: Aug 11, 1932 Today's Date: 09/28/2018   History of Present Illness  84 yo male with BPH, CAD, DM, HTN, arthritis, renal mass, CVA presented to [facility] with altered mental status and sepsis.  CT confirmed choledocholithiasis with two obstructing stones and signs of cholangitis.  He was transferred to [facility] and underwent ERCP 10/1. Awaiting sx - probably 10/3  Clinical Impression  Pt admitted with above diagnosis. Pt currently with functional limitations due to the deficits listed below (see PT Problem List). Patient is pleasantly confused, oriented to self only. Demonstrates decreased functional mobility secondary to cognitive impairments, balance deficits, and generalized weakness. Requiring two person maximal assistance to transfer from bed to recliner. Recommend SNF level therapies to maximize functional independence. Patient presents as a high fall risk based on deficits listed below and history of falling. Pt will benefit from skilled PT to increase their independence and safety with mobility to allow discharge to the venue listed below.       Follow Up Recommendations SNF;Supervision/Assistance - 24 hour    Equipment Recommendations  Other (comment)(defer to next venue)    Recommendations for Other Services       Precautions / Restrictions Precautions Precautions: Fall Restrictions Weight Bearing Restrictions: No      Mobility  Bed Mobility Overal bed mobility: Needs Assistance Bed Mobility: Supine to Sit     Supine to sit: Max assist;+2 for physical assistance;+2 for safety/equipment     General bed mobility comments: assist to bring BLE to EOB, max A for trunk elevation as well as use of pad to bring hips to the EOB  Transfers Overall transfer level: Needs assistance Equipment used: Rolling walker (2 wheeled) Transfers: Sit to/from [facility] Sit to Stand: Mod assist;+2  physical assistance Stand pivot transfers: Max assist;+2 physical assistance;+2 safety/equipment       General transfer comment: assist for boost, max A for control and pivot -in the future recommend not completing with the RW - he required assist to control it and it just got in the way  Ambulation/Gait                Stairs            Wheelchair Mobility    Modified Rankin (Stroke Patients Only)       Balance Overall balance assessment: Needs assistance Sitting-balance support: Bilateral upper extremity supported;Feet supported Sitting balance-Leahy Scale: Fair Sitting balance - Comments: started max A and progressed to min guard - fatigues quickly Postural control: Posterior lean Standing balance support: Bilateral upper extremity supported Standing balance-Leahy Scale: Poor Standing balance comment: dependent on therapist assist and watch for posterior lean                             Pertinent Vitals/Pain Pain Assessment: Faces Faces Pain Scale: No hurt    Home Living Family/patient expects to be discharged to:: Unsure                 Additional Comments: Pt is not able to answer home living questions at this time    Prior Function           Comments: unsure of prior levels as Pt unable to report and no family present at time of eval     Hand Dominance        Extremity/Trunk Assessment   Upper Extremity Assessment Upper Extremity  Assessment: Generalized weakness    Lower Extremity Assessment Lower Extremity Assessment: Generalized weakness    Cervical / Trunk Assessment Cervical / Trunk Assessment: Kyphotic  Communication   Communication: HOH  Cognition Arousal/Alertness: Awake/alert Behavior During Therapy: Restless Overall Cognitive Status: No family/caregiver present to determine baseline cognitive functioning Area of Impairment: Orientation;Attention;Memory;Following commands;Safety/judgement;Awareness;Problem  solving                 Orientation Level: Disoriented to;Place;Time;Situation Current Attention Level: Focused Memory: Decreased recall of precautions;Decreased short-term memory Following Commands: Follows one step commands with increased time;Follows one step commands inconsistently Safety/Judgement: Decreased awareness of safety;Decreased awareness of deficits Awareness: Intellectual Problem Solving: Decreased initiation;Requires verbal cues;Requires tactile cues General Comments: Pt believes he is sitting on his front porch, with "lots to do today." Pt most successful with short simple cues (able to follow 25% of the time) required tactile cues and task initiation 100% of the time      General Comments General comments (skin integrity, edema, etc.): Strabismus noted. Difficult to assess if pt had visual impairment secondary to cognitive deficits; when holding up one finger patient stated PT was holding up five fingers   Exercises     Assessment/Plan    PT Assessment Patient needs continued PT services  PT Problem List Decreased strength;Decreased activity tolerance;Decreased balance;Decreased mobility;Decreased cognition;Decreased safety awareness       PT Treatment Interventions DME instruction;Gait training;Functional mobility training;Therapeutic activities;Therapeutic exercise;Balance training;Patient/family education    PT Goals (Current goals can be found in the Care Plan section)  Acute Rehab PT Goals Patient Stated Goal: none stated PT Goal Formulation: Patient unable to participate in goal setting Time For Goal Achievement: 10/12/18 Potential to Achieve Goals: Fair    Frequency Min 2X/week   Barriers to discharge        Co-evaluation PT/OT/SLP Co-Evaluation/Treatment: Yes Reason for Co-Treatment: Complexity of the patient's impairments (multi-system involvement);Necessary to address cognition/behavior during functional activity;For patient/therapist  safety;To address functional/ADL transfers PT goals addressed during session: Mobility/safety with mobility OT goals addressed during session: ADL's and self-care;Proper use of Adaptive equipment and DME       AM-PAC PT "6 Clicks" Daily Activity  Outcome Measure Difficulty turning over in bed (including adjusting bedclothes, sheets and blankets)?: A Lot Difficulty moving from lying on back to sitting on the side of the bed? : Unable Difficulty sitting down on and standing up from a chair with arms (e.g., wheelchair, bedside commode, etc,.)?: Unable Help needed moving to and from a bed to chair (including a wheelchair)?: A Lot Help needed walking in hospital room?: A Lot Help needed climbing 3-5 steps with a railing? : Total 6 Click Score: 9    End of Session Equipment Utilized During Treatment: Gait belt Activity Tolerance: Patient tolerated treatment well Patient left: in chair;with call bell/phone within reach;with chair alarm set;Other (comment)(with telesitter) Nurse Communication: Mobility status PT Visit Diagnosis: Unsteadiness on feet (R26.81);Other abnormalities of gait and mobility (R26.89);Difficulty in walking, not elsewhere classified (R26.2)    Time: 2956-2130 PT Time Calculation (min) (ACUTE ONLY): 29 min   Charges:   PT Evaluation $PT Eval Moderate Complexity: [place], [place], DPT Acute Rehabilitation Services Pager [phone] Office [phone]   Willy Eddy 09/28/2018, 11:41 AM

## 2018-09-28 NOTE — Progress Notes (Signed)
Matthew Schultz 12:25 PM  Subjective: Patient doing well from his ERCP and he is eating some food and he has no new complaints Case discussed with his family as well  Objective: Vital signs stable afebrile no acute distress abdomen is soft nontender liver test improved CBC fine Assessment: Status post ERCP  Plan: Please let me know assistance with this hospital stay  [facility]  Pager [phone] After 5PM or if no answer call [phone]

## 2018-09-29 ENCOUNTER — Encounter (HOSPITAL_COMMUNITY): Payer: Self-pay | Admitting: Gastroenterology

## 2018-09-29 ENCOUNTER — Encounter (HOSPITAL_COMMUNITY)
Admission: AD | Disposition: A | Discharge status: Skilled Nursing Facility | Payer: Self-pay | Source: Other Acute Inpatient Hospital | Attending: Internal Medicine

## 2018-09-29 ENCOUNTER — Inpatient Hospital Stay (HOSPITAL_COMMUNITY): Payer: Medicare HMO

## 2018-09-29 DIAGNOSIS — B952 Enterococcus as the cause of diseases classified elsewhere: Secondary | ICD-10-CM

## 2018-09-29 DIAGNOSIS — Z9889 Other specified postprocedural states: Secondary | ICD-10-CM

## 2018-09-29 DIAGNOSIS — Z8719 Personal history of other diseases of the digestive system: Secondary | ICD-10-CM

## 2018-09-29 DIAGNOSIS — B9620 Unspecified Escherichia coli [E. coli] as the cause of diseases classified elsewhere: Secondary | ICD-10-CM

## 2018-09-29 DIAGNOSIS — N400 Enlarged prostate without lower urinary tract symptoms: Secondary | ICD-10-CM

## 2018-09-29 DIAGNOSIS — F05 Delirium due to known physiological condition: Secondary | ICD-10-CM

## 2018-09-29 DIAGNOSIS — F17220 Nicotine dependence, chewing tobacco, uncomplicated: Secondary | ICD-10-CM

## 2018-09-29 DIAGNOSIS — F0390 Unspecified dementia without behavioral disturbance: Secondary | ICD-10-CM

## 2018-09-29 DIAGNOSIS — I2510 Atherosclerotic heart disease of native coronary artery without angina pectoris: Secondary | ICD-10-CM

## 2018-09-29 DIAGNOSIS — E119 Type 2 diabetes mellitus without complications: Secondary | ICD-10-CM

## 2018-09-29 DIAGNOSIS — I10 Essential (primary) hypertension: Secondary | ICD-10-CM

## 2018-09-29 DIAGNOSIS — K8309 Other cholangitis: Secondary | ICD-10-CM

## 2018-09-29 LAB — CULTURE, BLOOD (ROUTINE X 2): Special Requests: ADEQUATE

## 2018-09-29 LAB — CBC
HEMATOCRIT: 34.7 % — AB (ref 39.0–52.0)
HEMOGLOBIN: 11.5 g/dL — AB (ref 13.0–17.0)
MCH: 29.9 pg (ref 26.0–34.0)
MCHC: 33.1 g/dL (ref 30.0–36.0)
MCV: 90.4 fL (ref 78.0–100.0)
Platelets: 141 10*3/uL — ABNORMAL LOW (ref 150–400)
RBC: 3.84 MIL/uL — ABNORMAL LOW (ref 4.22–5.81)
RDW: 13 % (ref 11.5–15.5)
WBC: 5.2 10*3/uL (ref 4.0–10.5)

## 2018-09-29 LAB — COMPREHENSIVE METABOLIC PANEL
ALT: 67 U/L — ABNORMAL HIGH (ref 0–44)
AST: 29 U/L (ref 15–41)
Albumin: 2.4 g/dL — ABNORMAL LOW (ref 3.5–5.0)
Alkaline Phosphatase: 145 U/L — ABNORMAL HIGH (ref 38–126)
Anion gap: 7 (ref 5–15)
BUN: 13 mg/dL (ref 8–23)
CHLORIDE: 109 mmol/L (ref 98–111)
CO2: 21 mmol/L — ABNORMAL LOW (ref 22–32)
Calcium: 7.7 mg/dL — ABNORMAL LOW (ref 8.9–10.3)
Creatinine, Ser: 0.81 mg/dL (ref 0.61–1.24)
Glucose, Bld: 105 mg/dL — ABNORMAL HIGH (ref 70–99)
POTASSIUM: 3.2 mmol/L — AB (ref 3.5–5.1)
SODIUM: 137 mmol/L (ref 135–145)
Total Bilirubin: 1.4 mg/dL — ABNORMAL HIGH (ref 0.3–1.2)
Total Protein: 5.2 g/dL — ABNORMAL LOW (ref 6.5–8.1)

## 2018-09-29 LAB — GLUCOSE, CAPILLARY
GLUCOSE-CAPILLARY: 93 mg/dL (ref 70–99)
GLUCOSE-CAPILLARY: 113 mg/dL — AB (ref 70–99)
Glucose-Capillary: 100 mg/dL — ABNORMAL HIGH (ref 70–99)
Glucose-Capillary: 114 mg/dL — ABNORMAL HIGH (ref 70–99)

## 2018-09-29 LAB — HEMOGLOBIN A1C
Hgb A1c MFr Bld: 6.2 % — ABNORMAL HIGH (ref 4.8–5.6)
Mean Plasma Glucose: 131 mg/dL

## 2018-09-29 LAB — TROPONIN I: Troponin I: <0.03 ng/mL (ref <0.03)

## 2018-09-29 SURGERY — LAPAROSCOPIC CHOLECYSTECTOMY WITH INTRAOPERATIVE CHOLANGIOGRAM
Anesthesia: General

## 2018-09-29 MED ORDER — FENTANYL CITRATE (PF) 250 MCG/5ML IJ SOLN
INTRAMUSCULAR | Status: AC
  Filled 2018-09-29: qty 5

## 2018-09-29 MED ORDER — RESOURCE THICKENUP CLEAR PO POWD
ORAL | Status: DC | PRN
  Filled 2018-09-29: qty 125

## 2018-09-29 MED ORDER — PROPOFOL 10 MG/ML IV BOLUS
INTRAVENOUS | Status: AC
  Filled 2018-09-29: qty 20

## 2018-09-29 MED ORDER — PROPOFOL 10 MG/ML IV BOLUS
INTRAVENOUS | Status: AC
  Filled 2018-09-29: qty 40

## 2018-09-29 MED ORDER — SODIUM CHLORIDE 0.9 % IV SOLN
2.0000 g | INTRAVENOUS | Status: DC
  Administered 2018-09-29 – 2018-09-30 (×5): 2 g via INTRAVENOUS
  Filled 2018-09-29 (×7): qty 2000

## 2018-09-29 MED ORDER — POTASSIUM CHLORIDE 10 MEQ/100ML IV SOLN
10.0000 meq | INTRAVENOUS | Status: AC
  Administered 2018-09-29 (×2): 10 meq via INTRAVENOUS
  Filled 2018-09-29 (×2): qty 100

## 2018-09-29 MED ORDER — HALOPERIDOL LACTATE 5 MG/ML IJ SOLN
2.5000 mg | Freq: Once | INTRAMUSCULAR | Status: AC
  Administered 2018-09-29: 2.5 mg via INTRAVENOUS
  Filled 2018-09-29: qty 1

## 2018-09-29 NOTE — Progress Notes (Addendum)
Modified Barium Swallow Progress Note  Patient Details  Name: Matthew Schultz MRN: [id number] Date of Birth: 03-08-1932  Today's Date: 09/29/2018  Modified Barium Swallow completed.  Full report located under Chart Review in the Imaging Section.  Brief recommendations include the following:  Clinical Impression  Pt presented with moderate oropharyngeal dysphagia marked by pharyngeal weakness evidenced by reduced hyolaryngeal excursion and decreased coordination/timing of swallow mechanisms. Suspect an acute on chronic dysphagia as pt's current medical diagnosis is choledocholithiasis with two obstructing stones in absence of acute neuro or respiratory involvement. Mild lingual/palatal residue and moderate vallecular residue noted across thin and nectar POs. Pt also penetrated thin bolus to the VF and aspirated without sensation, however volitional cough was effective to clear. While chin tuck strategy was effective in regards to its facilitation of complete airway closure with thin, pt's ability to execute commands to use the strategy was inconsistent and would therefore not be a reliable compensatory strategy. Full airway protection was present with nectar, puree, or regular textures, although prolonged mastication and oral transit observed with puree and regular, and premature spill to the valleculae X1 noted with nectar. Given current presentation and dentition, recommend D3 diet with nectar thick liquids, use slow/small bites and sips in a minimally distracting environment, and ST will continue to provide treatment with diet safety and efficiency including opportunities for advancement.    Swallow Evaluation Recommendations       SLP Diet Recommendations: Dysphagia 3 (Mech soft) solids;Nectar thick liquid   Liquid Administration via: Cup;No straw   Medication Administration: Whole meds with puree   Supervision: Full assist for feeding;Full supervision/cueing for compensatory  strategies   Compensations: Minimize environmental distractions;Slow rate;Small sips/bites   Postural Changes: Seated upright at 90 degrees   Oral Care Recommendations: Oral care BID   Other Recommendations: Order thickener from pharmacy    Houston Siren 09/29/2018,4:48 PM   Orbie Pyo Colvin Caroli.Ed Risk analyst [phone] Office [phone]

## 2018-09-29 NOTE — Progress Notes (Signed)
After family left, patient took off his gown and throw it on the floor and he tried to get out of bed unsupervised. Patient confused and uncooperative with staff. Continue mitts and soft restraints. Will continue to monitor.

## 2018-09-29 NOTE — NC FL2 (Addendum)
[place] LEVEL OF CARE SCREENING TOOL     IDENTIFICATION  Patient Name: Matthew Schultz Birthdate: 13-Jul-1932 Sex: male Admission Date (Current Location): 09/25/2018  [facility] and [place] Number:  Engineering geologist and Address:  [facility] [place], [place] [address], [place], [place] [address]      Provider Number: [id number]  Attending Physician Name and Address:  Jonetta Osgood, MD  Relative Name and Phone Number:  [name], daughter, [phone]    Current Level of Care: Hospital Recommended Level of Care: [place] Prior Approval Number:    Date Approved/Denied:   PASRR Number:   [id number]  Discharge Plan: SNF    Current Diagnoses: Patient Active Problem List   Diagnosis Date Noted  . Malnutrition of moderate degree 09/27/2018  . Cholangiolitis 09/26/2018  . Acute cholangitis 09/26/2018  . Sepsis due to Escherichia coli ([place])   . E coli bacteremia   . Hyperbilirubinemia   . Protein-calorie malnutrition, severe 04/14/2018  . Dementia with behavioral disturbance ([place]) 04/14/2018  . Acute cholecystitis 04/12/2018  . Choledocholithiasis   . Syncope 10/19/2016  . AKI (acute kidney injury) ([place]) 10/19/2016  . Cataract 09/07/2016  . Coronary artery disease 09/07/2016  . Diabetes ([place]) 09/07/2016  . HTN (hypertension) 09/07/2016  . Hyperlipidemia 09/07/2016  . Osteoarthritis 09/07/2016  . Stroke ([place]) 09/07/2016  . DDD (degenerative disc disease), lumbar 08/23/2014  . Lumbar radiculitis 08/23/2014  . Low back pain 06/01/2014  . Lumbar radiculopathy 06/01/2014    Orientation RESPIRATION BLADDER Height & Weight     Self  Normal Incontinent, External catheter Weight: 74.2 kg Height:  5\' 6"  (167.6 cm)  BEHAVIORAL SYMPTOMS/MOOD NEUROLOGICAL BOWEL NUTRITION STATUS      Continent Diet(Please see DC Summary)  AMBULATORY STATUS COMMUNICATION OF NEEDS Skin   Limited Assist Verbally Other (Comment)(Lacerations on arm  and elbow;abrasion on foot and wrist; skin tear on arm)                       Personal Care Assistance Level of Assistance  Bathing, Feeding, Dressing Bathing Assistance: Maximum assistance Feeding assistance: Limited assistance Dressing Assistance: Limited assistance     Functional Limitations Info  Sight, Hearing, Speech Sight Info: Impaired Hearing Info: Adequate Speech Info: Adequate    SPECIAL CARE FACTORS FREQUENCY  PT (By licensed PT), OT (By licensed OT)     PT Frequency: 5x/week OT Frequency: 3x/week            Contractures Contractures Info: Not present    Additional Factors Info  Code Status, Allergies, Psychotropic, Insulin Sliding Scale Code Status Info: Full Allergies Info: NKA Psychotropic Info: Risperdal Insulin Sliding Scale Info: 3x daily with meals and at bedtime       Current Medications (09/29/2018):  This is the current hospital active medication list Current Facility-Administered Medications  Medication Dose Route Frequency Provider Last Rate Last Dose  . ampicillin (OMNIPEN) 2 g in sodium chloride 0.9 % 100 mL IVPB  2 g Intravenous Q4H Reginia Naas, RPH 300 mL/hr at 09/29/18 1601 2 g at 09/29/18 1601  . aspirin EC tablet 81 mg  81 mg Oral Daily Clarene Essex, MD   81 mg at 09/29/18 1340  . donepezil (ARICEPT) tablet 5 mg  5 mg Oral Daily Clarene Essex, MD   5 mg at 09/29/18 1340  . gabapentin (NEURONTIN) capsule 300 mg  300 mg Oral QHS Clarene Essex, MD   300 mg at  09/28/18 2255  . heparin injection 5,000 Units  5,000 Units Subcutaneous Q8H Meuth, Brooke A, PA-C   5,000 Units at 09/29/18 1113  . insulin aspart (novoLOG) injection 0-5 Units  0-5 Units Subcutaneous QHS Joette Catching T, MD      . insulin aspart (novoLOG) injection 0-9 Units  0-9 Units Subcutaneous TID WC Cherene Altes, MD   2 Units at 09/28/18 0825  . pantoprazole (PROTONIX) EC tablet 40 mg  40 mg Oral Daily Clarene Essex, MD   40 mg at 09/29/18 1340  . [place]   Oral PRN Jonetta Osgood, MD      . risperiDONE (RISPERDAL) tablet 0.25 mg  0.25 mg Oral QHS Jonetta Osgood, MD   0.25 mg at 09/28/18 2256  . tamsulosin (FLOMAX) capsule 0.4 mg  0.4 mg Oral Daily Clarene Essex, MD   0.4 mg at 09/29/18 1340     Discharge Medications: Please see discharge summary for a list of discharge medications.  Relevant Imaging Results:  Relevant Lab Results:   Additional Information SSN: [place], [place]

## 2018-09-29 NOTE — Progress Notes (Signed)
Patient ID: Matthew Schultz, male   DOB: Oct 20, 1932, 84 y.o.   MRN: [id number]    2 Days Post-Op  Subjective: Patient confused, delirious, and at baseline demented.  Says everything hurts.  Objective: Vital signs in last 24 hours: Temp:  [98.2 F (36.8 C)-98.8 F (37.1 C)] 98.8 F (37.1 C) (10/03 0605) Pulse Rate:  [52-54] 54 (10/03 0605) Resp:  [18] 18 (10/03 0605) BP: (105-133)/(71-75) 105/71 (10/03 0605) SpO2:  [97 %-99 %] 97 % (10/03 0605) Last BM Date: (UTA)  Intake/Output from previous day: 10/02 0701 - 10/03 0700 In: 806.5 [P.O.:30; I.V.:426.5; IV Piggyback:350] Out: 1050 [Urine:1050] Intake/Output this shift: Total I/O In: 100 [IV Piggyback:100] Out: -   PE: Heart: regular Lungs: upper airway noises transmitted, but otherwise CTAB Abd: soft, some tenderness in RUQ, +BS, ND Psych: delirious, doesn't know where he is year etc.  Thinks he should be going to school.  Lab Results:  Recent Labs    09/28/18 0407  WBC 8.5  HGB 12.0*  HCT 35.3*  PLT 140*   BMET Recent Labs    09/28/18 0407 09/29/18 0444  NA 133* 137  K 3.3* 3.2*  CL 107 109  CO2 22 21*  GLUCOSE 205* 105*  BUN 20 13  CREATININE 1.03 0.81  CALCIUM 7.4* 7.7*   PT/INR No results for input(s): LABPROT, INR in the last 72 hours. CMP     Component Value Date/Time   NA 137 09/29/2018 0444   NA 141 11/29/2013 0622   K 3.2 (L) 09/29/2018 0444   K 4.2 11/28/2013 1610   CL 109 09/29/2018 0444   CL 108 (H) 11/28/2013 1610   CO2 21 (L) 09/29/2018 0444   CO2 21 11/28/2013 1610   GLUCOSE 105 (H) 09/29/2018 0444   GLUCOSE 132 (H) 11/28/2013 1610   BUN 13 09/29/2018 0444   BUN 25 09/07/2016 1103   BUN 27 (H) 11/28/2013 1610   CREATININE 0.81 09/29/2018 0444   CREATININE 0.71 11/28/2013 1610   CALCIUM 7.7 (L) 09/29/2018 0444   CALCIUM 8.2 (L) 11/28/2013 1610   PROT 5.2 (L) 09/29/2018 0444   PROT 6.8 11/28/2013 1610   ALBUMIN 2.4 (L) 09/29/2018 0444   ALBUMIN 3.5 11/28/2013 1610   AST 29  09/29/2018 0444   AST 28 11/28/2013 1610   ALT 67 (H) 09/29/2018 0444   ALT BLOOD 04/13/2018 0420   ALKPHOS 145 (H) 09/29/2018 0444   ALKPHOS 69 11/28/2013 1610   BILITOT 1.4 (H) 09/29/2018 0444   BILITOT 1.0 11/28/2013 1610   GFRNONAA >60 09/29/2018 0444   GFRNONAA >60 11/28/2013 1610   GFRAA >60 09/29/2018 0444   GFRAA >60 11/28/2013 1610   Lipase     Component Value Date/Time   LIPASE 27 04/11/2018 1817       Studies/Results: Dg Ercp Biliary & Pancreatic Ducts  Result Date: 09/27/2018 CLINICAL DATA:  Bile duct stone removal EXAM: ERCP TECHNIQUE: Multiple spot images obtained with the fluoroscopic device and submitted for interpretation post-procedure. COMPARISON:  None. FINDINGS: 2 fluoroscopic spot images document endoscopic cannulation and opacification of the CBD with passage of a balloon tipped catheter. There is incomplete opacification of the intrahepatic biliary tree, which appears decompressed centrally. IMPRESSION: Endoscopic CBD cannulation and intervention. These images were submitted for radiologic interpretation only. Please see the procedural report for the amount of contrast and the fluoroscopy time utilized. Electronically Signed   By: Lucrezia Europe M.D.   On: 09/27/2018 09:25    Anti-infectives: Anti-infectives (From admission,  onward)   Start     Dose/Rate Route Frequency Ordered Stop   09/28/18 1400  ceFAZolin (ANCEF) IVPB 1 g/50 mL premix     1 g 100 mL/hr over 30 Minutes Intravenous Every 8 hours 09/28/18 1212     09/27/18 1700  cefTRIAXone (ROCEPHIN) 2 g in sodium chloride 0.9 % 100 mL IVPB  Status:  Discontinued     2 g 200 mL/hr over 30 Minutes Intravenous Every 24 hours 09/27/18 1625 09/28/18 1212   09/26/18 0600  piperacillin-tazobactam (ZOSYN) IVPB 3.375 g  Status:  Discontinued     3.375 g 12.5 mL/hr over 240 Minutes Intravenous Every 8 hours 09/26/18 0411 09/27/18 1625       Assessment/Plan Acute cholangitis, s/p ERCP, E. coli bacteremia -s/p  ERCP and labs are trending down.  TB 1.4 today from 1.2, but other LFTs are normalizing. -patient does not have evidence of cholecystitis on his imaging.  Given his current medical state with his delirium and chronic dementia, we will likely plan to hold off on surgery for now due to his high risk for peri-operative complications.   -family coming in later this morning and we will return and discuss this with them.  This has been discussed with primary team and they agree with this current plan.  Dementia/delirium CAD HTN CVA ?Dysphagia DM  FEN - per primary, may need swallow VTE - SCDs/heparin ID - ancef     LOS: 4 days    Henreitta Cea , [facility] 09/29/2018, 8:07 AM Pager: [phone]

## 2018-09-29 NOTE — Progress Notes (Signed)
Patient continues to pull out his IV lines, order obtained for bilateral  Non-violent wrist restraint

## 2018-09-29 NOTE — Progress Notes (Signed)
PHARMACY - PHYSICIAN COMMUNICATION CRITICAL VALUE ALERT - BLOOD CULTURE IDENTIFICATION (BCID)  Matthew Schultz is an 84 y.o. male who presented to [facility] on 09/25/2018 with a chief complaint of AMS.  Assessment:   84 yo M with cholangitis had E. Coli detected in blood cx. Micro updated [place] today stating the other blood cx also had E. Casseliflavus. This species can be intrinsically resistant to vancomycin but generally keeps good susceptibility with PCNs. E. Coli is pan sensitive and to cover for enterococcus will switch to ampicillin. Currently planning for possible lap chole tomorrow. Contacted ID service as will need further workup.  Name of physician (or Provider) Contacted: S. Ghimire, K. Tommy Medal  Current antibiotics: Cefazolin  Changes to prescribed antibiotics recommended:  Stop cefazolin Start ampicillin 2g IV Q4h Monitor clinical picture, renal function F/U C&S, ID recs, LOT   Results for orders placed or performed during the hospital encounter of 09/25/18  Blood Culture ID Panel (Reflexed) (Collected: 09/25/2018  2:41 PM)  Result Value Ref Range   Enterococcus species NOT DETECTED NOT DETECTED   Listeria monocytogenes NOT DETECTED NOT DETECTED   Staphylococcus species NOT DETECTED NOT DETECTED   Staphylococcus aureus (BCID) NOT DETECTED NOT DETECTED   Streptococcus species NOT DETECTED NOT DETECTED   Streptococcus agalactiae NOT DETECTED NOT DETECTED   Streptococcus pneumoniae NOT DETECTED NOT DETECTED   Streptococcus pyogenes NOT DETECTED NOT DETECTED   Acinetobacter baumannii NOT DETECTED NOT DETECTED   Enterobacteriaceae species DETECTED (A) NOT DETECTED   Enterobacter cloacae complex NOT DETECTED NOT DETECTED   Escherichia coli DETECTED (A) NOT DETECTED   Klebsiella oxytoca NOT DETECTED NOT DETECTED   Klebsiella pneumoniae NOT DETECTED NOT DETECTED   Proteus species NOT DETECTED NOT DETECTED   Serratia marcescens NOT DETECTED NOT DETECTED   Carbapenem  resistance NOT DETECTED NOT DETECTED   Haemophilus influenzae NOT DETECTED NOT DETECTED   Neisseria meningitidis NOT DETECTED NOT DETECTED   Pseudomonas aeruginosa NOT DETECTED NOT DETECTED   Candida albicans NOT DETECTED NOT DETECTED   Candida glabrata NOT DETECTED NOT DETECTED   Candida krusei NOT DETECTED NOT DETECTED   Candida parapsilosis NOT DETECTED NOT DETECTED   Candida tropicalis NOT DETECTED NOT DETECTED    Matthew Schultz J 09/29/2018  2:07 PM

## 2018-09-29 NOTE — Progress Notes (Addendum)
PROGRESS NOTE        PATIENT DETAILS Name: Matthew Schultz Age: 84 y.o. Sex: male Date of Birth: Aug 03, 1932 Admit Date: 09/25/2018 Admitting Physician Tanda Rockers, MD WEX:[id number], [name], MD  Brief Narrative: Patient is a 84 y.o. male with a history of BPH, CAD, DM, dyslipidemia presented with altered mental status, patient was found to have sepsis secondary to cholangitis and E. coli bacteremia.  He underwent ERCP-and is clinically improved.  General surgery and GI following-tentatively scheduled for laparoscopic cholecystectomy in the next day or so.  Subjective: Confused last night-received Haldol-lethargic this morning.  Complains of pain all over!  Assessment/Plan: Sepsis secondary to acute cholangitis and E. coli bacteremia: Sepsis pathophysiology has resolved, underwent ERCP on 10/1.  Although ideally requires cholecystectomy-he is markedly delirious-is accumulating secretions- is at high risk of aspiration and at high risk for postoperative complications.  Agree with general surgery-that it may be best to hold off on surgery.  He does not have gallstones-and now has a sphincterotomy.  Continue with IV antibiotics-we will discuss with family when they arrive later today.  Dementia with delirium: Continues to have delirium-required IV Haldol last night-we will increase Risperdal to 0.5 mg.  Continue supportive care  Dysphagia: Continues to accumulate secretions-but better than yesterday--have discussed with speech therapy-they will reevaluate later today when he is more awake alert-and start him on the appropriate diet.  Will discuss with family regarding risks of aspiration when they arrive.  Acute kidney injury: Hemodynamically mediated, seems to have resolved.  Hypokalemia: We will continue to replete and recheck.  GERD: Continue PPI.  BPH: Continue Flomax-appears stable.  DM-2: CBGs stable with SSI.  Follow and adjust accordingly.  Incidental  right adrenal adenoma: Stable for outpatient follow-up  Deconditioning/debility: Worsened by acute illness-much weaker than usual baseline-probably will require SNF on discharge.  DVT Prophylaxis: Prophylactic Heparin   Code Status: Full code   Family Communication: None at bedside  Disposition Plan: Remain inpatient-SNF at discharge-hopefully later this week  Antimicrobial agents: Anti-infectives (From admission, onward)   Start     Dose/Rate Route Frequency Ordered Stop   09/28/18 1400  ceFAZolin (ANCEF) IVPB 1 g/50 mL premix     1 g 100 mL/hr over 30 Minutes Intravenous Every 8 hours 09/28/18 1212     09/27/18 1700  cefTRIAXone (ROCEPHIN) 2 g in sodium chloride 0.9 % 100 mL IVPB  Status:  Discontinued     2 g 200 mL/hr over 30 Minutes Intravenous Every 24 hours 09/27/18 1625 09/28/18 1212   09/26/18 0600  piperacillin-tazobactam (ZOSYN) IVPB 3.375 g  Status:  Discontinued     3.375 g 12.5 mL/hr over 240 Minutes Intravenous Every 8 hours 09/26/18 0411 09/27/18 1625      Procedures: 10/1>> ERCP  CONSULTS:  GI and general surgery  Time spent: 25- minutes-Greater than 50% of this time was spent in counseling, explanation of diagnosis, planning of further management, and coordination of care.  MEDICATIONS: Scheduled Meds: . aspirin EC  81 mg Oral Daily  . donepezil  5 mg Oral Daily  . gabapentin  300 mg Oral QHS  . heparin  5,000 Units Subcutaneous Q8H  . insulin aspart  0-5 Units Subcutaneous QHS  . insulin aspart  0-9 Units Subcutaneous TID WC  . pantoprazole  40 mg Oral Daily  . risperiDONE  0.25 mg Oral QHS  .  tamsulosin  0.4 mg Oral Daily   Continuous Infusions: . sodium chloride 1,000 mL (09/29/18 0739)  .  ceFAZolin (ANCEF) IV Stopped (09/29/18 7062)  . potassium chloride 10 mEq (09/29/18 0837)   PRN Meds:.   PHYSICAL EXAM: Vital signs: Vitals:   09/28/18 0449 09/28/18 0500 09/28/18 1331 09/29/18 0605  BP: 110/71  133/75 105/71  Pulse: 61  (!) 52  (!) 54  Resp: (!) 21  18 18   Temp: 98 F (36.7 C)  98.2 F (36.8 C) 98.8 F (37.1 C)  TempSrc: Oral  Oral   SpO2: 96%  99% 97%  Weight:  74.2 kg    Height:       Filed Weights   09/27/18 0709 09/27/18 1829 09/28/18 0500  Weight: 69.5 kg 73.8 kg 74.2 kg   Body mass index is 26.4 kg/m.   General appearance: Lethargic-confused-complains of pain all over. Eyes:no scleral icterus. HEENT: Atraumatic and Normocephalic Neck: supple, no JVD. Resp:Good air entry bilaterally,no rales or rhonchi CVS: S1 S2 regular, no murmurs.  GI: Bowel sounds present, soft and nontender.  Extremities: B/L Lower Ext shows no edema, both legs are warm to touch Neurology: Moves all 4 extremities-difficult exam due to mental status. Musculoskeletal:No digital cyanosis Skin:No Rash, warm and dry Wounds:N/A  I have personally reviewed following labs and imaging studies  LABORATORY DATA: CBC: Recent Labs  Lab 09/25/18 1411 09/26/18 0350 09/28/18 0407 09/29/18 0750  WBC 20.3* 15.4* 8.5 5.2  NEUTROABS 18.8*  --  7.3  --   HGB 13.4 12.4* 12.0* 11.5*  HCT 38.1* 36.9* 35.3* 34.7*  MCV 89.1 91.1 90.1 90.4  PLT 178 165 140* 141*    Basic Metabolic Panel: Recent Labs  Lab 09/25/18 1411 09/26/18 0356 09/28/18 0407 09/29/18 0444  NA 140 139 133* 137  K 3.5 3.5 3.3* 3.2*  CL 104 110 107 109  CO2 21* 20* 22 21*  GLUCOSE 219* 144* 205* 105*  BUN 29* 26* 20 13  CREATININE 1.28* 1.03 1.03 0.81  CALCIUM 8.6* 8.0* 7.4* 7.7*  MG  --  2.2  --   --     GFR: Estimated Creatinine Clearance: 61.3 mL/min (by C-G formula based on SCr of 0.81 mg/dL).  Liver Function Tests: Recent Labs  Lab 09/25/18 1411 09/26/18 0356 09/28/18 0407 09/29/18 0444  AST 538* 241* 33 29  ALT 319* 252* 92* 67*  ALKPHOS 208* 184* 150* 145*  BILITOT 5.2* 5.8* 1.2 1.4*  PROT 7.1 5.9* 5.5* 5.2*  ALBUMIN 3.5 2.8* 2.4* 2.4*   No results for input(s): LIPASE, AMYLASE in the last 168 hours. No results for input(s):  AMMONIA in the last 168 hours.  Coagulation Profile: No results for input(s): INR, PROTIME in the last 168 hours.  Cardiac Enzymes: Recent Labs  Lab 09/25/18 1411  TROPONINI <0.03    BNP (last 3 results) No results for input(s): PROBNP in the last 8760 hours.  HbA1C: Recent Labs    09/28/18 0407  HGBA1C 6.2*    CBG: Recent Labs  Lab 09/28/18 0813 09/28/18 1213 09/28/18 1657 09/28/18 2148 09/29/18 0731  GLUCAP 165* 111* 113* 105* 93    Lipid Profile: No results for input(s): CHOL, HDL, LDLCALC, TRIG, CHOLHDL, LDLDIRECT in the last 72 hours.  Thyroid Function Tests: No results for input(s): TSH, T4TOTAL, FREET4, T3FREE, THYROIDAB in the last 72 hours.  Anemia Panel: No results for input(s): VITAMINB12, FOLATE, FERRITIN, TIBC, IRON, RETICCTPCT in the last 72 hours.  Urine analysis:    Component  Value Date/Time   COLORURINE AMBER (A) 09/25/2018 1440   APPEARANCEUR CLEAR (A) 09/25/2018 1440   APPEARANCEUR Clear 11/28/2013 1712   LABSPEC 1.019 09/25/2018 1440   LABSPEC 1.019 11/28/2013 1712   PHURINE 6.0 09/25/2018 1440   GLUCOSEU 50 (A) 09/25/2018 1440   GLUCOSEU Negative 11/28/2013 1712   HGBUR NEGATIVE 09/25/2018 1440   BILIRUBINUR SMALL (A) 09/25/2018 1440   BILIRUBINUR Negative 11/28/2013 1712   KETONESUR NEGATIVE 09/25/2018 1440   PROTEINUR 30 (A) 09/25/2018 1440   NITRITE NEGATIVE 09/25/2018 1440   LEUKOCYTESUR NEGATIVE 09/25/2018 1440   LEUKOCYTESUR Negative 11/28/2013 1712    Sepsis Labs: Lactic Acid, Venous    Component Value Date/Time   LATICACIDVEN 1.7 09/26/2018 0350    MICROBIOLOGY: Recent Results (from the past 240 hour(s))  Culture, blood (routine x 2)     Status: Abnormal   Collection Time: 09/25/18  2:41 PM  Result Value Ref Range Status   Specimen Description   Final    BLOOD RAC Performed at [facility], [address] [place], [place] [address]    Special Requests   Final    BOTTLES DRAWN AEROBIC AND ANAEROBIC  Blood Culture adequate volume Performed at [facility], [address] [place], [place] [address]    Culture  Setup Time   Final    GRAM NEGATIVE RODS IN BOTH AEROBIC AND ANAEROBIC BOTTLES CRITICAL RESULT CALLED TO, READ BACK BY AND VERIFIED WITH: DAVID BESANTI AT 0981 09/26/18.PMH Performed at [facility], [place] [address], [place], [place] [address]    Culture ESCHERICHIA COLI (A)  Final   Report Status 09/29/2018 FINAL  Final   Organism ID, Bacteria ESCHERICHIA COLI  Final      Susceptibility   Escherichia coli - MIC*    AMPICILLIN <=2 SENSITIVE Sensitive     CEFAZOLIN <=4 SENSITIVE Sensitive     CEFEPIME <=1 SENSITIVE Sensitive     CEFTAZIDIME <=1 SENSITIVE Sensitive     CEFTRIAXONE <=1 SENSITIVE Sensitive     CIPROFLOXACIN <=0.25 SENSITIVE Sensitive     GENTAMICIN <=1 SENSITIVE Sensitive     IMIPENEM <=0.25 SENSITIVE Sensitive     TRIMETH/SULFA <=20 SENSITIVE Sensitive     AMPICILLIN/SULBACTAM <=2 SENSITIVE Sensitive     PIP/TAZO <=4 SENSITIVE Sensitive     Extended ESBL NEGATIVE Sensitive     * ESCHERICHIA COLI  Culture, blood (routine x 2)     Status: Abnormal (Preliminary result)   Collection Time: 09/25/18  2:41 PM  Result Value Ref Range Status   Specimen Description   Final    BLOOD L AC Performed at [facility], [address], [place], [place] [address]    Special Requests   Final    BOTTLES DRAWN AEROBIC AND ANAEROBIC Blood Culture adequate volume Performed at [facility], [address] [place], [place] [address]    Culture  Setup Time   Final    GRAM NEGATIVE RODS IN BOTH AEROBIC AND ANAEROBIC BOTTLES CRITICAL RESULT CALLED TO, READ BACK BY AND VERIFIED WITH: DAVID BESANTI AT 6578 09/26/18.PMH Performed at [facility], [address] [place], [place] [address]    Culture (A)  Final    ESCHERICHIA COLI SUSCEPTIBILITIES PERFORMED ON PREVIOUS CULTURE WITHIN THE LAST 5 DAYS. Performed at [facility], [place] [address] [place], [place] [address]    Report Status PENDING  Incomplete  Blood Culture ID Panel (Reflexed)     Status: Abnormal   Collection  Time: 09/25/18  2:41 PM  Result Value Ref Range Status   Enterococcus species NOT DETECTED NOT DETECTED Final   Listeria monocytogenes NOT DETECTED NOT DETECTED Final   Staphylococcus species NOT DETECTED NOT DETECTED Final   Staphylococcus aureus NOT DETECTED NOT DETECTED Final   Streptococcus species NOT DETECTED NOT DETECTED Final   Streptococcus agalactiae NOT DETECTED NOT DETECTED Final   Streptococcus pneumoniae NOT DETECTED NOT DETECTED Final   Streptococcus pyogenes NOT DETECTED NOT DETECTED Final   Acinetobacter baumannii NOT DETECTED NOT DETECTED Final   Enterobacteriaceae species DETECTED (A) NOT DETECTED Final    Comment: Enterobacteriaceae represent a large family of gram-negative bacteria, not a single organism. CRITICAL RESULT CALLED TO, READ BACK BY AND VERIFIED WITH: DAVID BESANTI AT 7262 09/26/18.PMH    Enterobacter cloacae complex NOT DETECTED NOT DETECTED Final   Escherichia coli DETECTED (A) NOT DETECTED Final    Comment: CRITICAL RESULT CALLED TO, READ BACK BY AND VERIFIED WITH: DAVID BESANTI AT 0355 09/26/18.PMH    Klebsiella oxytoca NOT DETECTED NOT DETECTED Final   Klebsiella pneumoniae NOT DETECTED NOT DETECTED Final   Proteus species NOT DETECTED NOT DETECTED Final   Serratia marcescens NOT DETECTED NOT DETECTED Final   Carbapenem resistance NOT DETECTED NOT DETECTED Final   Haemophilus influenzae NOT DETECTED NOT DETECTED Final   Neisseria meningitidis NOT DETECTED NOT DETECTED Final   Pseudomonas aeruginosa NOT DETECTED NOT DETECTED Final   Candida albicans NOT DETECTED NOT DETECTED Final   Candida glabrata NOT DETECTED NOT DETECTED Final   Candida krusei NOT DETECTED NOT DETECTED Final   Candida parapsilosis NOT DETECTED NOT DETECTED Final   Candida tropicalis NOT DETECTED NOT DETECTED Final    Comment:  Performed at [facility], [address] [place], [place] [address]  MRSA PCR Screening     Status: None   Collection Time: 09/25/18 11:18 PM  Result Value Ref Range Status   MRSA by PCR NEGATIVE NEGATIVE Final    Comment:        The GeneXpert MRSA Assay (FDA approved for NASAL specimens only), is one component of a comprehensive MRSA colonization surveillance program. It is not intended to diagnose MRSA infection nor to guide or monitor treatment for MRSA infections. Performed at [facility], [place] [address] [place], [place] [address]     RADIOLOGY STUDIES/RESULTS: Ct Head Wo Contrast  Result Date: 09/25/2018 CLINICAL DATA:  Altered LOC EXAM: CT HEAD WITHOUT CONTRAST TECHNIQUE: Contiguous axial images were obtained from the base of the skull through the vertex without intravenous contrast. COMPARISON:  MRI brain 11/29/2013, CT brain 11/28/2013 FINDINGS: Brain: No acute territorial infarction, hemorrhage or intracranial mass is visualized. Moderate-to-marked atrophy. Moderate small vessel ischemic changes of the white matter. Stable ventricle size. Vascular: No hyperdense vessel.  Carotid vascular calcification Skull: No fracture. Stable heterogenous lucency in the right parietal bone. Sinuses/Orbits: No acute finding. Other: None IMPRESSION: 1. No CT evidence for acute intracranial abnormality. 2. Atrophy with small vessel ischemic changes of the white matter. Electronically Signed   By: Donavan Foil M.D.   On: 09/25/2018 15:00   Ct Abdomen Pelvis W Contrast  Result Date: 09/25/2018 CLINICAL DATA:  Right-sided abdominal pain. EXAM: CT ABDOMEN AND PELVIS WITH CONTRAST TECHNIQUE: Multidetector CT imaging of the abdomen and pelvis was performed using the standard protocol following bolus administration of intravenous contrast. CONTRAST:  116mL ISOVUE-300 IOPAMIDOL (ISOVUE-300) INJECTION 61% COMPARISON:  Ultrasound September 25, 2018 and CT scan June 26, 2014 FINDINGS:  Lower chest: No acute abnormality. Hepatobiliary: Intra and extrahepatic biliary duct dilatation is identified. The common bile duct measures up to 2 cm. At least 2 filling defects are seen in the distal common bile duct with the largest measuring 14 mm on coronal image 41 and a smaller more distal apparent defect measuring 9 mm on coronal image 43. There is distention of the gallbladder. No liver mass identified. Portal vein is patent. Pancreas: Unremarkable. No pancreatic ductal dilatation or surrounding inflammatory changes. Spleen: Normal in size without focal abnormality. Adrenals/Urinary Tract: The left adrenal gland is normal. A low-attenuation mass in the right adrenal gland was noted to be adenoma on previous imaging. This adenoma measures nearly 2 cm. There is a small cyst in the left kidney which is otherwise normal. The left ureter is normal. A focus of air in the bladder is likely from recent catheterization. The bladder is otherwise normal. The patient has a right pelvic kidney with at least 1 cysts and severe hydronephrosis, unchanged. The ureter is not dilated. There is mild excretion into the dilated collecting system on delayed images. Stomach/Bowel: The stomach and small bowel are normal. The colon and visualized portions of the appendix are normal. Vascular/Lymphatic: Atherosclerotic changes are seen in the nonaneurysmal aorta. No adenopathy. Reproductive: Prostate is unremarkable. Other: No abdominal wall hernia or abnormality. No abdominopelvic ascites. Musculoskeletal: Anterior wedging of L3 is new since 2016. No other acute bony abnormalities. IMPRESSION: 1. Two filling defects in the distal common bile duct measuring 14 and 9 mm are consistent with choledocholithiasis resulting in intra and extrahepatic biliary duct dilatation and distention of the gallbladder. 2. 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 3. Anterior wedging of  L3 is new since 2016. It is age indeterminate but I would favor the finding is nonacute. Recommend clinical correlation. 4. Right adrenal adenoma. 5. Atherosclerotic changes in the nonaneurysmal aorta. Electronically Signed   By: Dorise Bullion III M.D   On: 09/25/2018 18:34   Dg Chest Portable 1 View  Result Date: 09/25/2018 CLINICAL DATA:  Cough and congestion. EXAM: PORTABLE CHEST 1 VIEW COMPARISON:  September 25, 2018 FINDINGS: Scoliotic curvature of the thoracic spine. Atelectasis in the left base. The heart, hila, mediastinum, lungs, and pleura are otherwise unchanged. IMPRESSION: No active disease. Electronically Signed   By: Dorise Bullion III M.D   On: 09/25/2018 18:52   Dg Chest Port 1 View  Result Date: 09/25/2018 CLINICAL DATA:  Altered mental status. EXAM: PORTABLE CHEST 1 VIEW COMPARISON:  Radiograph of October 19, 2016. FINDINGS: Stable cardiomegaly. Atherosclerosis of thoracic aorta is noted. Dextroscoliosis of thoracic spine is noted. No pneumothorax is noted. Lungs are clear. Bony thorax is unremarkable. IMPRESSION: No acute cardiopulmonary abnormality seen. Aortic Atherosclerosis (ICD10-I70.0). Electronically Signed   By: Marijo Conception, M.D.   On: 09/25/2018 14:50   Dg Ercp Biliary & Pancreatic Ducts  Result Date: 09/27/2018 CLINICAL DATA:  Bile duct stone removal EXAM: ERCP TECHNIQUE: Multiple spot images obtained with the fluoroscopic device and submitted for interpretation post-procedure. COMPARISON:  None. FINDINGS: 2 fluoroscopic spot images document endoscopic cannulation and opacification of the CBD with passage of a balloon tipped catheter. There is incomplete opacification of the intrahepatic biliary tree, which appears decompressed centrally. IMPRESSION: Endoscopic CBD cannulation and intervention. These images were submitted for radiologic interpretation only. Please see the procedural report for the amount of contrast and the fluoroscopy time utilized. Electronically  Signed   By:  Lucrezia Europe M.D.   On: 09/27/2018 09:25   US Abdomen Limited Ruq  Result Date: 09/25/2018 CLINICAL DATA:  Elevated LFTs EXAM: ULTRASOUND ABDOMEN LIMITED RIGHT UPPER QUADRANT COMPARISON:  None. FINDINGS: Gallbladder: Small amount of sludge. No cholelithiasis. No pericholecystic fluid. Thickened gallbladder wall measuring up to 4.5 mm. Negative sonographic Murphy sign. Common bile duct: Diameter: 8 mm distally. No obstructing mass or choledocholithiasis. Liver: No focal lesion identified. Within normal limits in parenchymal echogenicity. Portal vein is patent on color Doppler imaging with normal direction of blood flow towards the liver. IMPRESSION: No cholelithiasis. Mild gallbladder wall thickening which may be reactive secondary to hepatocellular disease given the elevated LFTs. No sonographic evidence of acute cholecystitis. Electronically Signed   By: Kathreen Devoid   On: 09/25/2018 16:37     LOS: 4 days   Oren Binet, MD  [facility]  If 7PM-7AM, please contact night-coverage  Please page via [email or web address] [email or web address]-click on MD name and type text message  09/29/2018, 9:01 AM

## 2018-09-29 NOTE — Consult Note (Signed)
   [facility] CM Inpatient Consult   09/29/2018  [name] Apr 01, 1932 [id number]  Patient screened for potential [facility] needs in [facility] plan.  Made inpatient RNCM aware of eligibility.  No current needs determined.  Will follow as needs are identified.   Please place a [facility] Care Management consult or for questions contact:   Natividad Brood, RN BSN [facility] Liaison  [phone] business mobile phone Toll free office [phone]

## 2018-09-29 NOTE — Consult Note (Addendum)
Date of Admission:  09/25/2018          Reason for Consult: Polymicrobial bacteremia w E coli and Enterococcus    Referring Provider: "Auto consult" for Enterococcus and Dr. Sloan Leiter   Assessment:  1. Polymicrobial bacteremia with pansensitive E. coli and enterococcus [name] with sepsis secondary to 2. Cholangitis with obstructive pathology status post ERCP and removal of debris from common bile duct and pancreatic ducts and sphincterotomy 3. Dementia 4. Superimposed delirium  Plan:  1. Switch from ceftriaxone to ampicillin to cover both the E. coli and enterococcus 2. He has had 2D echocardiogram, but would not hazard a transesophageal echocardiogram 3. Repeat blood culture pictures to ensure they are clearing 4. Since he does not have endocarditis by TTE I I would personally favor changing him to a bioavailable oral antibiotic such as Augmentin to finish his course of therapy once he has been stable for discharge. 5. Would address goals of care with wife, her family members and patient.  From talking to them they did not seem eager for him to undergo aggressive care such as CPR or intubation.  Dr. Megan Salon will see the patient tomorrow  Active Problems:   Choledocholithiasis   Cholangiolitis   Acute cholangitis   Sepsis due to Escherichia coli ([place])   E coli bacteremia   Malnutrition of moderate degree   Scheduled Meds: . aspirin EC  81 mg Oral Daily  . donepezil  5 mg Oral Daily  . gabapentin  300 mg Oral QHS  . heparin  5,000 Units Subcutaneous Q8H  . insulin aspart  0-5 Units Subcutaneous QHS  . insulin aspart  0-9 Units Subcutaneous TID WC  . pantoprazole  40 mg Oral Daily  . risperiDONE  0.25 mg Oral QHS  . tamsulosin  0.4 mg Oral Daily   Continuous Infusions: . ampicillin (OMNIPEN) IV     PRN Meds:.  HPI: Matthew Schultz is a 84 y.o. male history of BPH coronary artery disease diabetes mellitus hypertension, was brought to [facility] with confusion and with a septic picture.  He was thought to be suffering from cholangitis.  CT imaging had disclosed choledocholithiasis with 2 obstructing stones causing biliary duct dilatation and distention of the gallbladder.  He is transferred to [facility] for consideration of cholecystectomy  with general surgery.  He was admitted to the intensive care unit and placed on Zosyn.  Blood cultures have been obtained prior to initiation of antibiotics.  They have subsequent grown E. coli in 2 out of 2 sites which is pansensitive.  Since then 1 of the cultures is also growing an enterococcus cassiFlavius with susceptibilities pending He has undergone ERCP with removal of debris and obstructive stones and sphincterotomy.  Post ERCP his obstructive hepatitis picture has been improving and he has been improving clinically.  He is been transferred to a regular floor bed.  He was narrowed to ceftriaxone from Zosyn but now with the enterococcus having being filed found he needs to have it targeted as well as ceftriaxone is not a good drug by itself for enterococcus.  On exam he is oriented to person and recognizes his wife but not his daughter.  He seems fairly confused when I examined him his exam is pertinent symptom for some right upper quadrant pain but without rebound.  I am changing him to ampicillin.  I am repeating blood cultures to ensure they have cleared.  He has already had a 2D  echocardiogram that does not show evidence of endocarditis.  I would not hazard a transesophageal echocardiogram.  I am also not anxious to give him prolonged IV antibiotics and would instead favor changing him to a highly bioavailable drug such as Augmentin when he is stable for discharge.  Provided he does not have evidence of endocarditis this would be a safe maneuver and 2 weeks of total therapy would be quite reasonable.     Review of Systems: Review of Systems  Unable to perform ROS: Dementia     Past Medical History:  Diagnosis Date  . Arthritis   . BPH (benign prostatic hyperplasia)   . CAD (coronary artery disease)   . DM (diabetes mellitus) ([place])   . HLD (hyperlipidemia)   . HTN (hypertension)   . Hydronephrosis   . Incomplete bladder emptying   . OA (osteoarthritis)   . Renal mass   . Stroke [facility])     Social History   Tobacco Use  . Smoking status: Former Smoker    Types: Cigarettes  . Smokeless tobacco: Current User    Types: Chew  Substance Use Topics  . Alcohol use: No  . Drug use: No    Family History  Problem Relation Age of Onset  . Pneumonia Father   . Kidney cancer Neg Hx   . Kidney disease Neg Hx   . Prostate cancer Neg Hx    No Known Allergies  OBJECTIVE: Blood pressure 105/71, pulse (!) 54, temperature 98.8 F (37.1 C), resp. rate 18, height 5\' 6"  (1.676 m), weight 74.2 kg, SpO2 97 %.  Physical Exam  Constitutional: No distress.  HENT:  Head: Normocephalic.  Nose: Nose normal.  Mouth/Throat: Oropharynx is clear and moist.  Eyes: Conjunctivae and EOM are normal.  Neck: Normal range of motion. No tracheal deviation present.  Cardiovascular: Normal rate, regular rhythm and normal heart sounds. Exam reveals no gallop and no friction rub.  No murmur heard. Pulmonary/Chest: Effort normal. No respiratory distress. He has no wheezes. He has rales. He exhibits no tenderness.  Abdominal: Soft. Bowel sounds are normal. He exhibits no mass. There is tenderness. There is no rebound and no guarding.  Neurological: He is alert. He has normal strength. He is disoriented.  Skin: Skin is warm. He is not diaphoretic. There is pallor.  Psychiatric: His speech is delayed. He is slowed. Cognition and memory are impaired. He exhibits abnormal recent memory.    Lab Results Lab Results  Component Value Date   WBC 5.2 09/29/2018   HGB 11.5 (L) 09/29/2018   HCT 34.7 (L) 09/29/2018   MCV 90.4 09/29/2018   PLT 141 (L) 09/29/2018    Lab Results   Component Value Date   CREATININE 0.81 09/29/2018   BUN 13 09/29/2018   NA 137 09/29/2018   K 3.2 (L) 09/29/2018   CL 109 09/29/2018   CO2 21 (L) 09/29/2018    Lab Results  Component Value Date   ALT 67 (H) 09/29/2018   AST 29 09/29/2018   ALKPHOS 145 (H) 09/29/2018   BILITOT 1.4 (H) 09/29/2018     Microbiology: Recent Results (from the past 240 hour(s))  Culture, blood (routine x 2)     Status: Abnormal   Collection Time: 09/25/18  2:41 PM  Result Value Ref Range Status   Specimen Description   Final    BLOOD RAC Performed at [facility], [address], [place], [place] [address]    Special Requests   Final  BOTTLES DRAWN AEROBIC AND ANAEROBIC Blood Culture adequate volume Performed at [facility], [address] [place], [place] [address]    Culture  Setup Time   Final    GRAM NEGATIVE RODS IN BOTH AEROBIC AND ANAEROBIC BOTTLES CRITICAL RESULT CALLED TO, READ BACK BY AND VERIFIED WITH: DAVID BESANTI AT 0174 09/26/18.PMH Performed at [facility], [place] [address] [place], [place] [address]    Culture ESCHERICHIA COLI (A)  Final   Report Status 09/29/2018 FINAL  Final   Organism ID, Bacteria ESCHERICHIA COLI  Final      Susceptibility   Escherichia coli - MIC*    AMPICILLIN <=2 SENSITIVE Sensitive     CEFAZOLIN <=4 SENSITIVE Sensitive     CEFEPIME <=1 SENSITIVE Sensitive     CEFTAZIDIME <=1 SENSITIVE Sensitive     CEFTRIAXONE <=1 SENSITIVE Sensitive     CIPROFLOXACIN <=0.25 SENSITIVE Sensitive     GENTAMICIN <=1 SENSITIVE Sensitive     IMIPENEM <=0.25 SENSITIVE Sensitive     TRIMETH/SULFA <=20 SENSITIVE Sensitive     AMPICILLIN/SULBACTAM <=2 SENSITIVE Sensitive     PIP/TAZO <=4 SENSITIVE Sensitive     Extended ESBL NEGATIVE Sensitive     * ESCHERICHIA COLI  Culture, blood (routine x 2)     Status: Abnormal (Preliminary result)   Collection Time: 09/25/18  2:41 PM  Result Value Ref Range Status   Specimen Description   Final     BLOOD L AC Performed at [facility], [address], [place], [place] [address]    Special Requests   Final    BOTTLES DRAWN AEROBIC AND ANAEROBIC Blood Culture adequate volume Performed at [facility], [address] [place], [place] [address]    Culture  Setup Time   Final    GRAM NEGATIVE RODS IN BOTH AEROBIC AND ANAEROBIC BOTTLES CRITICAL RESULT CALLED TO, READ BACK BY AND VERIFIED WITH: DAVID BESANTI AT 5993 09/26/18.PMH Performed at [facility], [address] [place], [place] [address]    Culture (A)  Final    ESCHERICHIA COLI SUSCEPTIBILITIES PERFORMED ON PREVIOUS CULTURE WITHIN THE LAST 5 DAYS. ENTEROCOCCUS CASSELIFLAVUS SUSCEPTIBILITIES TO FOLLOW CRITICAL RESULT CALLED TO, READ BACK BY AND VERIFIED WITH: [place] B [id number] FCP Performed at [facility], [place] [address] [place],  [address]    Report Status PENDING  Incomplete  Blood Culture ID Panel (Reflexed)     Status: Abnormal   Collection Time: 09/25/18  2:41 PM  Result Value Ref Range Status   Enterococcus species NOT DETECTED NOT DETECTED Final   Listeria monocytogenes NOT DETECTED NOT DETECTED Final   Staphylococcus species NOT DETECTED NOT DETECTED Final   Staphylococcus aureus (BCID) NOT DETECTED NOT DETECTED Final   Streptococcus species NOT DETECTED NOT DETECTED Final   Streptococcus agalactiae NOT DETECTED NOT DETECTED Final   Streptococcus pneumoniae NOT DETECTED NOT DETECTED Final   Streptococcus pyogenes NOT DETECTED NOT DETECTED Final   Acinetobacter baumannii NOT DETECTED NOT DETECTED Final   Enterobacteriaceae species DETECTED (A) NOT DETECTED Final    Comment: Enterobacteriaceae represent a large family of gram-negative bacteria, not a single organism. CRITICAL RESULT CALLED TO, READ BACK BY AND VERIFIED WITH: DAVID BESANTI AT 3007 09/26/18.PMH    Enterobacter cloacae complex NOT DETECTED NOT DETECTED Final   Escherichia coli DETECTED (A) NOT  DETECTED Final    Comment: CRITICAL RESULT CALLED TO, READ BACK BY AND VERIFIED WITH: DAVID BESANTI AT 6226 09/26/18.PMH  Klebsiella oxytoca NOT DETECTED NOT DETECTED Final   Klebsiella pneumoniae NOT DETECTED NOT DETECTED Final   Proteus species NOT DETECTED NOT DETECTED Final   Serratia marcescens NOT DETECTED NOT DETECTED Final   Carbapenem resistance NOT DETECTED NOT DETECTED Final   Haemophilus influenzae NOT DETECTED NOT DETECTED Final   Neisseria meningitidis NOT DETECTED NOT DETECTED Final   Pseudomonas aeruginosa NOT DETECTED NOT DETECTED Final   Candida albicans NOT DETECTED NOT DETECTED Final   Candida glabrata NOT DETECTED NOT DETECTED Final   Candida krusei NOT DETECTED NOT DETECTED Final   Candida parapsilosis NOT DETECTED NOT DETECTED Final   Candida tropicalis NOT DETECTED NOT DETECTED Final    Comment: Performed at [facility], [address] [place], [place] [address]  MRSA PCR Screening     Status: None   Collection Time: 09/25/18 11:18 PM  Result Value Ref Range Status   MRSA by PCR NEGATIVE NEGATIVE Final    Comment:        The GeneXpert MRSA Assay (FDA approved for NASAL specimens only), is one component of a comprehensive MRSA colonization surveillance program. It is not intended to diagnose MRSA infection nor to guide or monitor treatment for MRSA infections. Performed at [facility], [place] [address] [place], [place] [address]     Alcide Evener, [place] [facility] [place] [facility] [phone] pager  09/29/2018, 2:34 PM

## 2018-09-29 NOTE — Progress Notes (Signed)
  Speech Language Pathology Treatment: Dysphagia  Patient Details Name: Matthew Schultz MRN: [id number] DOB: August 25, 1932 Today's Date: 09/29/2018 Time: 0131-4388 SLP Time Calculation (min) (ACUTE ONLY): 13 min  Assessment / Plan / Recommendation Clinical Impression  Although awake and alert, pt's ability to provide answers to questions regarding PMH limited due to altered mentation. Family at bedside denied history of pneumonia or dysphagia prior to this hospital admission. Pt presented with noteworthy baseline congested cough as well as wet vocal quality, immediate throat clear and delayed cough follow trials of thin and puree POs. Given concern for aspiration, recommend continue NPO until instrumental MBS test completed this afternoon.    HPI HPI: [name] with h/o BPH, CAD, DM, HLD, HTN, OA, Renal mass, and CVA, who presented to [facility] with altered mental status and sepsis.  CT confirmed choledocholithiasis with two obstructing stones and signs of cholangitis.  He was transferred to [facility] and underwent ERCP 10/1.  CXR 9/29 without active disease.      SLP Plan  MBS       Recommendations  Diet recommendations: NPO;Other(comment)(until instrumental testing)                Oral Care Recommendations: Oral care QID Follow up Recommendations: Skilled Nursing facility SLP Visit Diagnosis: Dysphagia, pharyngeal phase (R13.13) Plan: MBS       Matthew Schultz, Student SLP                Matthew Schultz 09/29/2018, 2:30 PM

## 2018-09-29 NOTE — Clinical Social Work Note (Signed)
Clinical Social Work Assessment  Patient Details  Name: Matthew Schultz MRN: [id number] Date of Birth: 1932/06/12  Date of referral:  09/29/18               Reason for consult:  Facility Placement                Permission sought to share information with:  Facility Sport and exercise psychologist, Family Supports Permission granted to share information::  No  Name::     Engineer, manufacturing::  SNFs  Relationship::  Daughter  Contact Information:  [phone]  Housing/Transportation Living arrangements for the past 2 months:  Single Family Home Source of Information:  Adult Children, Spouse Patient Interpreter Needed:  None, Sign Language Criminal Activity/Legal Involvement Pertinent to Current Situation/Hospitalization:  No - Comment as needed Significant Relationships:  Adult Children, Spouse Lives with:  Adult Children, Spouse Do you feel safe going back to the place where you live?  No Need for family participation in patient care:  Yes (Comment)  Care giving concerns:  CSW received consult for possible SNF placement at time of discharge. CSW spoke with patient's spouse, son, and daughter at bedside regarding PT recommendation of SNF placement at time of discharge. Patient's daughter reported that patient's spouse is currently unable to care for patient at their home given patient's current physical needs and fall risk. Patient's daughter expressed understanding of PT recommendation and is agreeable to SNF placement at time of discharge. CSW to continue to follow and assist with discharge planning needs.   Social Worker assessment / plan:  CSW spoke with patient's family concerning possibility of rehab at [facility] before returning home.  Employment status:  Retired Nurse, adult PT Recommendations:  [place] / Referral to community resources:  [place]  Patient/Family's Response to care:  Patient's family recognizes need for  rehab before returning home and is agreeable to a SNF in [place]. CSW provided SNF list. Patient's wife requests that patient's daughter make the decisions. Once a SNF has been chosen, CSW will be able to start insurance authorization, which may take up to three business days.  Patient/Family's Understanding of and Emotional Response to Diagnosis, Current Treatment, and Prognosis:  Patient/family is realistic regarding therapy needs and expressed being hopeful for SNF placement. Patient's family expressed understanding of CSW role and discharge process as well as medical condition. No questions/concerns about plan or treatment.    Emotional Assessment Appearance:  Appears stated age Attitude/Demeanor/Rapport:  Unable to Assess Affect (typically observed):  Unable to Assess Orientation:  Oriented to Self Alcohol / Substance use:  Not Applicable Psych involvement (Current and /or in the community):  No (Comment)  Discharge Needs  Concerns to be addressed:  Care Coordination Readmission within the last 30 days:  No Current discharge risk:  None Barriers to Discharge:  Continued Medical Work up   [employer], LCSW 09/29/2018, 4:43 PM

## 2018-09-30 DIAGNOSIS — K8042 Calculus of bile duct with acute cholecystitis without obstruction: Secondary | ICD-10-CM

## 2018-09-30 DIAGNOSIS — B952 Enterococcus as the cause of diseases classified elsewhere: Secondary | ICD-10-CM

## 2018-09-30 DIAGNOSIS — R7881 Bacteremia: Secondary | ICD-10-CM

## 2018-09-30 LAB — GLUCOSE, CAPILLARY
GLUCOSE-CAPILLARY: 118 mg/dL — AB (ref 70–99)
GLUCOSE-CAPILLARY: 143 mg/dL — AB (ref 70–99)
GLUCOSE-CAPILLARY: 120 mg/dL — AB (ref 70–99)
Glucose-Capillary: 142 mg/dL — ABNORMAL HIGH (ref 70–99)

## 2018-09-30 LAB — COMPREHENSIVE METABOLIC PANEL
ALK PHOS: 188 U/L — AB (ref 38–126)
ALT: 60 U/L — AB (ref 0–44)
AST: 36 U/L (ref 15–41)
Albumin: 2.8 g/dL — ABNORMAL LOW (ref 3.5–5.0)
Anion gap: 12 (ref 5–15)
BUN: 8 mg/dL (ref 8–23)
CALCIUM: 8.2 mg/dL — AB (ref 8.9–10.3)
CHLORIDE: 103 mmol/L (ref 98–111)
CO2: 24 mmol/L (ref 22–32)
CREATININE: 0.82 mg/dL (ref 0.61–1.24)
Glucose, Bld: 124 mg/dL — ABNORMAL HIGH (ref 70–99)
Potassium: 3.1 mmol/L — ABNORMAL LOW (ref 3.5–5.1)
SODIUM: 139 mmol/L (ref 135–145)
Total Bilirubin: 1.7 mg/dL — ABNORMAL HIGH (ref 0.3–1.2)
Total Protein: 6 g/dL — ABNORMAL LOW (ref 6.5–8.1)

## 2018-09-30 LAB — CULTURE, BLOOD (ROUTINE X 2): SPECIAL REQUESTS: ADEQUATE

## 2018-09-30 MED ORDER — AMOXICILLIN 500 MG PO CAPS
500.0000 mg | ORAL_CAPSULE | Freq: Three times a day (TID) | ORAL | Status: AC
  Administered 2018-09-30 – 2018-10-04 (×14): 500 mg via ORAL
  Filled 2018-09-30 (×14): qty 1

## 2018-09-30 MED ORDER — RISPERIDONE 0.5 MG PO TABS
0.5000 mg | ORAL_TABLET | Freq: Every day | ORAL | Status: DC
  Administered 2018-09-30 – 2018-10-04 (×5): 0.5 mg via ORAL
  Filled 2018-09-30 (×5): qty 1

## 2018-09-30 MED ORDER — POTASSIUM CHLORIDE CRYS ER 20 MEQ PO TBCR
40.0000 meq | EXTENDED_RELEASE_TABLET | Freq: Two times a day (BID) | ORAL | Status: DC
  Administered 2018-09-30 – 2018-10-05 (×11): 40 meq via ORAL
  Filled 2018-09-30 (×11): qty 2

## 2018-09-30 NOTE — Progress Notes (Signed)
Patient ID: Matthew Schultz, male   DOB: December 18, 1932, 84 y.o.   MRN: [id number]         [facility]  Date of Admission:  09/25/2018   Total days of antibiotics 6         ASSESSMENT: He is improving on therapy for E. coli and enterococcal bacteremia complicating common bile duct stones.  We have switched him to oral amoxicillin and recommend 4 more days of therapy.  PLAN: 1. Continue amoxicillin through 10/04/2018 2. I will sign off now  Active Problems:   Choledocholithiasis   Cholangiolitis   Acute cholangitis   Sepsis due to Escherichia coli (HCC)   E coli bacteremia   Malnutrition of moderate degree   Scheduled Meds: . amoxicillin  500 mg Oral Q8H  . aspirin EC  81 mg Oral Daily  . donepezil  5 mg Oral Daily  . gabapentin  300 mg Oral QHS  . heparin  5,000 Units Subcutaneous Q8H  . insulin aspart  0-5 Units Subcutaneous QHS  . insulin aspart  0-9 Units Subcutaneous TID WC  . pantoprazole  40 mg Oral Daily  . potassium chloride  40 mEq Oral BID  . risperiDONE  0.5 mg Oral QHS  . tamsulosin  0.4 mg Oral Daily   Continuous Infusions: PRN Meds:.RESOURCE THICKENUP CLEAR  Review of Systems: Review of Systems  Unable to perform ROS: Dementia    No Known Allergies  OBJECTIVE: Vitals:   09/29/18 1500 09/29/18 2011 09/30/18 0441 09/30/18 0500  BP: 110/73 127/64 127/72   Pulse: (!) 55 70 61   Resp: 18 18 16    Temp: 98.7 F (37.1 C) 98 F (36.7 C) 97.9 F (36.6 C)   TempSrc: Oral Oral Oral   SpO2: 98%  98%   Weight:    67.8 kg  Height:       Body mass index is 24.13 kg/m.  Physical Exam  Constitutional:  He is resting quietly in bed.  He is pleasantly confused.  Cardiovascular: Normal rate, regular rhythm and normal heart sounds.  Pulmonary/Chest: Effort normal and breath sounds normal.  Abdominal: Soft. He exhibits no distension. There is no tenderness.  Neurological: He is alert.    Lab Results Lab Results  Component Value  Date   WBC 5.2 09/29/2018   HGB 11.5 (L) 09/29/2018   HCT 34.7 (L) 09/29/2018   MCV 90.4 09/29/2018   PLT 141 (L) 09/29/2018    Lab Results  Component Value Date   CREATININE 0.82 09/30/2018   BUN 8 09/30/2018   NA 139 09/30/2018   K 3.1 (L) 09/30/2018   CL 103 09/30/2018   CO2 24 09/30/2018    Lab Results  Component Value Date   ALT 60 (H) 09/30/2018   AST 36 09/30/2018   ALKPHOS 188 (H) 09/30/2018   BILITOT 1.7 (H) 09/30/2018     Microbiology: Recent Results (from the past 240 hour(s))  Culture, blood (routine x 2)     Status: Abnormal   Collection Time: 09/25/18  2:41 PM  Result Value Ref Range Status   Specimen Description   Final    BLOOD RAC Performed at [facility], [address] [place], [place] [address]    Special Requests   Final    BOTTLES DRAWN AEROBIC AND ANAEROBIC Blood Culture adequate volume Performed at [facility], [address], [place], [place] [address]    Culture  Setup Time   Final  GRAM NEGATIVE RODS IN BOTH AEROBIC AND ANAEROBIC BOTTLES CRITICAL RESULT CALLED TO, READ BACK BY AND VERIFIED WITH: DAVID BESANTI AT 5188 09/26/18.PMH Performed at [facility], [place] [address], [place], [place] [address]    Culture ESCHERICHIA COLI (A)  Final   Report Status 09/29/2018 FINAL  Final   Organism ID, Bacteria ESCHERICHIA COLI  Final      Susceptibility   Escherichia coli - MIC*    AMPICILLIN <=2 SENSITIVE Sensitive     CEFAZOLIN <=4 SENSITIVE Sensitive     CEFEPIME <=1 SENSITIVE Sensitive     CEFTAZIDIME <=1 SENSITIVE Sensitive     CEFTRIAXONE <=1 SENSITIVE Sensitive     CIPROFLOXACIN <=0.25 SENSITIVE Sensitive     GENTAMICIN <=1 SENSITIVE Sensitive     IMIPENEM <=0.25 SENSITIVE Sensitive     TRIMETH/SULFA <=20 SENSITIVE Sensitive     AMPICILLIN/SULBACTAM <=2 SENSITIVE Sensitive     PIP/TAZO <=4 SENSITIVE Sensitive     Extended ESBL NEGATIVE Sensitive     * ESCHERICHIA COLI  Culture, blood (routine x 2)      Status: Abnormal   Collection Time: 09/25/18  2:41 PM  Result Value Ref Range Status   Specimen Description   Final    BLOOD L AC Performed at [facility], [address] [place], [place] [address]    Special Requests   Final    BOTTLES DRAWN AEROBIC AND ANAEROBIC Blood Culture adequate volume Performed at [facility], [address] [place], [place] [address]    Culture  Setup Time   Final    GRAM NEGATIVE RODS IN BOTH AEROBIC AND ANAEROBIC BOTTLES CRITICAL RESULT CALLED TO, READ BACK BY AND VERIFIED WITH: DAVID BESANTI AT 2355 09/26/18.PMH Performed at [facility], [address] [place], [place] [address]    Culture (A)  Final    ESCHERICHIA COLI SUSCEPTIBILITIES PERFORMED ON PREVIOUS CULTURE WITHIN THE LAST 5 DAYS. ENTEROCOCCUS CASSELIFLAVUS CRITICAL RESULT CALLED TO, READ BACK BY AND VERIFIED WITH: Odell B [place] [id number] FCP Performed at [facility], [place] [address], [place], [place] [address]    Report Status 09/30/2018 FINAL  Final   Organism ID, Bacteria ENTEROCOCCUS CASSELIFLAVUS  Final      Susceptibility   Enterococcus casseliflavus - MIC*    AMPICILLIN <=2 SENSITIVE Sensitive     VANCOMYCIN RESISTANT Resistant     GENTAMICIN SYNERGY SENSITIVE Sensitive     LINEZOLID 2 SENSITIVE Sensitive     * ENTEROCOCCUS CASSELIFLAVUS  Blood Culture ID Panel (Reflexed)     Status: Abnormal   Collection Time: 09/25/18  2:41 PM  Result Value Ref Range Status   Enterococcus species NOT DETECTED NOT DETECTED Final   Listeria monocytogenes NOT DETECTED NOT DETECTED Final   Staphylococcus species NOT DETECTED NOT DETECTED Final   Staphylococcus aureus (BCID) NOT DETECTED NOT DETECTED Final   Streptococcus species NOT DETECTED NOT DETECTED Final   Streptococcus agalactiae NOT DETECTED NOT DETECTED Final   Streptococcus pneumoniae NOT DETECTED NOT DETECTED Final   Streptococcus pyogenes NOT DETECTED NOT DETECTED Final   Acinetobacter  baumannii NOT DETECTED NOT DETECTED Final   Enterobacteriaceae species DETECTED (A) NOT DETECTED Final    Comment: Enterobacteriaceae represent a large family of gram-negative bacteria, not a single organism. CRITICAL RESULT CALLED TO, READ BACK BY AND VERIFIED WITH: DAVID BESANTI AT 0623 09/26/18.PMH    Enterobacter cloacae complex NOT DETECTED NOT DETECTED Final   Escherichia coli DETECTED (A) NOT DETECTED Final  Comment: CRITICAL RESULT CALLED TO, READ BACK BY AND VERIFIED WITH: DAVID BESANTI AT 7371 09/26/18.PMH    Klebsiella oxytoca NOT DETECTED NOT DETECTED Final   Klebsiella pneumoniae NOT DETECTED NOT DETECTED Final   Proteus species NOT DETECTED NOT DETECTED Final   Serratia marcescens NOT DETECTED NOT DETECTED Final   Carbapenem resistance NOT DETECTED NOT DETECTED Final   Haemophilus influenzae NOT DETECTED NOT DETECTED Final   Neisseria meningitidis NOT DETECTED NOT DETECTED Final   Pseudomonas aeruginosa NOT DETECTED NOT DETECTED Final   Candida albicans NOT DETECTED NOT DETECTED Final   Candida glabrata NOT DETECTED NOT DETECTED Final   Candida krusei NOT DETECTED NOT DETECTED Final   Candida parapsilosis NOT DETECTED NOT DETECTED Final   Candida tropicalis NOT DETECTED NOT DETECTED Final    Comment: Performed at [facility], [address] [place], [place] [address]  MRSA PCR Screening     Status: None   Collection Time: 09/25/18 11:18 PM  Result Value Ref Range Status   MRSA by PCR NEGATIVE NEGATIVE Final    Comment:        The GeneXpert MRSA Assay (FDA approved for NASAL specimens only), is one component of a comprehensive MRSA colonization surveillance program. It is not intended to diagnose MRSA infection nor to guide or monitor treatment for MRSA infections. Performed at [facility], [place] [address], [place], [place] [address]   Culture, blood (Routine X 2) w Reflex to ID Panel     Status: None (Preliminary result)   Collection Time:  09/29/18  2:20 PM  Result Value Ref Range Status   Specimen Description BLOOD RIGHT ARM  Final   Special Requests   Final    BOTTLES DRAWN AEROBIC ONLY Blood Culture adequate volume   Culture   Final    NO GROWTH < 24 HOURS Performed at [facility], [place] [address], [place], [place] [address]    Report Status PENDING  Incomplete  Culture, blood (Routine X 2) w Reflex to ID Panel     Status: None (Preliminary result)   Collection Time: 09/29/18  2:24 PM  Result Value Ref Range Status   Specimen Description BLOOD LEFT ARM  Final   Special Requests   Final    BOTTLES DRAWN AEROBIC ONLY Blood Culture adequate volume   Culture   Final    NO GROWTH < 24 HOURS Performed at [facility], [place] [address] [place],  [address]    Report Status PENDING  Incomplete    Michel Bickers, MD [facility] [phone] pager   [phone] cell 09/30/2018, 1:07 PM

## 2018-09-30 NOTE — Progress Notes (Signed)
Physical Therapy Treatment Patient Details Name: Matthew Schultz MRN: [id number] DOB: 1932/09/20 Today's Date: 09/30/2018    History of Present Illness 84 yo male with BPH, CAD, DM, HTN, arthritis, renal mass, CVA presented to [facility] with altered mental status and sepsis.  CT confirmed choledocholithiasis with two obstructing stones and signs of cholangitis.  He was transferred to [facility] and underwent ERCP 10/1. Awaiting sx - probably 10/3    PT Comments    Patient seen for activity progression. Tolerated transfers x2 during session and attempted functional task performance and EOB and standing activities. Patient limited by need for hygiene and pericare. Remains pleasantly confused throughout session.   Follow Up Recommendations  SNF;Supervision/Assistance - 24 hour     Equipment Recommendations  Other (comment)(defer to next venue)    Recommendations for Other Services       Precautions / Restrictions Precautions Precautions: Fall Restrictions Weight Bearing Restrictions: No    Mobility  Bed Mobility Overal bed mobility: Needs Assistance Bed Mobility: Supine to Sit;Sit to Supine     Supine to sit: Mod assist;+2 for safety/equipment Sit to supine: Mod assist;+2 for safety/equipment   General bed mobility comments: Assist to elevate trunk to upright and rotate to EOB, increased assist to return to supine and reposition in bed  Transfers Overall transfer level: Needs assistance Equipment used: 2 person hand held assist Transfers: Sit to/from [employer] Transfers Sit to Stand: Mod assist;+2 physical assistance Stand pivot transfers: Max assist;+2 physical assistance;+2 safety/equipment       General transfer comment: Performed x2 during session, assist to power up to standing for hygiene and pericare. Transitioned to chair, unsafe to remain so assisted back to bed  Ambulation/Gait             General Gait Details: unable to perform   Stairs              Wheelchair Mobility    Modified Rankin (Stroke Patients Only)       Balance Overall balance assessment: Needs assistance Sitting-balance support: Bilateral upper extremity supported;Feet supported Sitting balance-Leahy Scale: Fair Sitting balance - Comments: min guard for safety Postural control: Posterior lean Standing balance support: Bilateral upper extremity supported Standing balance-Leahy Scale: Poor Standing balance comment: dependent on UE support from therapy                            Cognition Arousal/Alertness: Awake/alert Behavior During Therapy: WFL for tasks assessed/performed Overall Cognitive Status: No family/caregiver present to determine baseline cognitive functioning Area of Impairment: Orientation;Attention;Memory;Following commands;Safety/judgement;Awareness;Problem solving                 Orientation Level: Disoriented to;Place;Time;Situation Current Attention Level: Sustained Memory: Decreased recall of precautions;Decreased short-term memory Following Commands: Follows one step commands with increased time;Follows one step commands inconsistently Safety/Judgement: Decreased awareness of safety;Decreased awareness of deficits Awareness: Intellectual Problem Solving: Decreased initiation;Requires verbal cues;Requires tactile cues General Comments: Patient continues to perseverate on getting gas in the tank of the car thorughout session      Exercises      General Comments General comments (skin integrity, edema, etc.): hygiene and pericare performed      Pertinent Vitals/Pain Pain Assessment: Faces Faces Pain Scale: Hurts a little bit Pain Location: chest when laying flat Pain Descriptors / Indicators: Grimacing Pain Intervention(s): Monitored during session    Home Living  Prior Function            PT Goals (current goals can now be found in the care plan section) Acute Rehab PT  Goals Patient Stated Goal: none stated PT Goal Formulation: Patient unable to participate in goal setting Time For Goal Achievement: 10/12/18 Potential to Achieve Goals: Fair Progress towards PT goals: Progressing toward goals    Frequency    Min 2X/week      PT Plan Current plan remains appropriate    Co-evaluation              AM-PAC PT "6 Clicks" Daily Activity  Outcome Measure  Difficulty turning over in bed (including adjusting bedclothes, sheets and blankets)?: A Lot Difficulty moving from lying on back to sitting on the side of the bed? : Unable Difficulty sitting down on and standing up from a chair with arms (e.g., wheelchair, bedside commode, etc,.)?: Unable Help needed moving to and from a bed to chair (including a wheelchair)?: A Lot Help needed walking in hospital room?: A Lot Help needed climbing 3-5 steps with a railing? : Total 6 Click Score: 9    End of Session Equipment Utilized During Treatment: Gait belt Activity Tolerance: Patient tolerated treatment well Patient left: in bed;with call bell/phone within reach;with bed alarm set;with restraints reapplied;Other (comment)(with telesitter and mits) Nurse Communication: Mobility status(hygiene and pericare performed/condom cath off) PT Visit Diagnosis: Unsteadiness on feet (R26.81);Other abnormalities of gait and mobility (R26.89);Difficulty in walking, not elsewhere classified (R26.2)     Time: 0938-1829 PT Time Calculation (min) (ACUTE ONLY): 21 min  Charges:  $Therapeutic Activity: 8-22 mins                     Alben Deeds, PT DPT  Board Certified Neurologic Specialist [place] Pager [phone] Office [phone]    Duncan Dull 09/30/2018, 9:38 AM

## 2018-09-30 NOTE — Progress Notes (Signed)
Patient ID: Matthew Schultz, male   DOB: 1932-07-07, 84 y.o.   MRN: [id number]    3 Days Post-Op  Subjective: Confused.  No pain.  Objective: Vital signs in last 24 hours: Temp:  [97.9 F (36.6 C)-98.7 F (37.1 C)] 97.9 F (36.6 C) (10/04 0441) Pulse Rate:  [55-70] 61 (10/04 0441) Resp:  [16-18] 16 (10/04 0441) BP: (110-127)/(64-73) 127/72 (10/04 0441) SpO2:  [98 %] 98 % (10/04 0441) Weight:  [67.8 kg] 67.8 kg (10/04 0500) Last BM Date: (PTA)  Intake/Output from previous day: 10/03 0701 - 10/04 0700 In: 637 [I.V.:87; IV Piggyback:550] Out: 2650 [Urine:2650] Intake/Output this shift: No intake/output data recorded.  PE: Abd: soft, NT, ND, +BS  Lab Results:  Recent Labs    09/28/18 0407 09/29/18 0750  WBC 8.5 5.2  HGB 12.0* 11.5*  HCT 35.3* 34.7*  PLT 140* 141*   BMET Recent Labs    09/28/18 0407 09/29/18 0444  NA 133* 137  K 3.3* 3.2*  CL 107 109  CO2 22 21*  GLUCOSE 205* 105*  BUN 20 13  CREATININE 1.03 0.81  CALCIUM 7.4* 7.7*   PT/INR No results for input(s): LABPROT, INR in the last 72 hours. CMP     Component Value Date/Time   NA 137 09/29/2018 0444   NA 141 11/29/2013 0622   K 3.2 (L) 09/29/2018 0444   K 4.2 11/28/2013 1610   CL 109 09/29/2018 0444   CL 108 (H) 11/28/2013 1610   CO2 21 (L) 09/29/2018 0444   CO2 21 11/28/2013 1610   GLUCOSE 105 (H) 09/29/2018 0444   GLUCOSE 132 (H) 11/28/2013 1610   BUN 13 09/29/2018 0444   BUN 25 09/07/2016 1103   BUN 27 (H) 11/28/2013 1610   CREATININE 0.81 09/29/2018 0444   CREATININE 0.71 11/28/2013 1610   CALCIUM 7.7 (L) 09/29/2018 0444   CALCIUM 8.2 (L) 11/28/2013 1610   PROT 5.2 (L) 09/29/2018 0444   PROT 6.8 11/28/2013 1610   ALBUMIN 2.4 (L) 09/29/2018 0444   ALBUMIN 3.5 11/28/2013 1610   AST 29 09/29/2018 0444   AST 28 11/28/2013 1610   ALT 67 (H) 09/29/2018 0444   ALT BLOOD 04/13/2018 0420   ALKPHOS 145 (H) 09/29/2018 0444   ALKPHOS 69 11/28/2013 1610   BILITOT 1.4 (H) 09/29/2018 0444     BILITOT 1.0 11/28/2013 1610   GFRNONAA >60 09/29/2018 0444   GFRNONAA >60 11/28/2013 1610   GFRAA >60 09/29/2018 0444   GFRAA >60 11/28/2013 1610   Lipase     Component Value Date/Time   LIPASE 27 04/11/2018 1817       Studies/Results: Dg Swallowing Func-speech Pathology  Result Date: 09/29/2018 Objective Swallowing Evaluation: Type of Study: MBS-Modified Barium Swallow Study  Patient Details Name: Matthew Schultz MRN: [id number] Date of Birth: 06-Jan-1932 Today's Date: 09/29/2018 Time: SLP Start Time (ACUTE ONLY): 4235 -SLP Stop Time (ACUTE ONLY): 1520 SLP Time Calculation (min) (ACUTE ONLY): 16 min Past Medical History: Past Medical History: Diagnosis Date . Arthritis  . BPH (benign prostatic hyperplasia)  . CAD (coronary artery disease)  . DM (diabetes mellitus) ([place])  . HLD (hyperlipidemia)  . HTN (hypertension)  . Hydronephrosis  . Incomplete bladder emptying  . OA (osteoarthritis)  . Renal mass  . Stroke [facility])  Past Surgical History: Past Surgical History: Procedure Laterality Date . BACK SURGERY   HPI: [name] with h/o BPH, CAD, DM, HLD, HTN, OA, Renal mass, and CVA, who presented to  [place] with altered mental status and sepsis.  CT confirmed choledocholithiasis with two obstructing stones and signs of cholangitis.  He was transferred to [facility] and underwent ERCP 10/1.  CXR 9/29 without active disease.  Subjective: pt alert but minimally conversant, not following commands well Assessment / Plan / Recommendation CHL IP CLINICAL IMPRESSIONS 09/29/2018 Clinical Impression Pt presented with moderate oropharyngeal dysphagia marked by pharyngeal weakness evidenced by reduced hyolaryngeal excursion and decreased coordination/timing of swallow mechanisms. Mild lingual/palatal residue and moderate vallecular residue noted across thin and nectar POs. Pt also penetrated thin bolus to the VF and aspirated without sensation, however volitional cough was effective to clear. While chin tuck strategy  was effective in regards to its facilitation of complete airway closure with thin, pt's ability to execute commands to use the strategy was inconsistent and would therefore not be a reliable compensatory strategy. Full airway protection was present with nectar, puree, or regular textures, although prolonged mastication and oral transit observed with puree and regular, and premature spill to the valleculae X1 noted with nectar. Given current presentation and dentition, recommend D3 diet with nectar thick liquids, use slow/small bites and sips in a minimally distracting environment, and ST will continue to provide treatment with diet safety and efficiency including opportunities for advancement.  SLP Visit Diagnosis Dysphagia, oropharyngeal phase (R13.12) Attention and concentration deficit following -- Frontal lobe and executive function deficit following -- Impact on safety and function Moderate aspiration risk   CHL IP TREATMENT RECOMMENDATION 09/29/2018 Treatment Recommendations Therapy as outlined in treatment plan below   Prognosis 09/29/2018 Prognosis for Safe Diet Advancement Good Barriers to Reach Goals Cognitive deficits Barriers/Prognosis Comment -- CHL IP DIET RECOMMENDATION 09/29/2018 SLP Diet Recommendations Dysphagia 3 (Mech soft) solids;Nectar thick liquid Liquid Administration via Cup;No straw Medication Administration Whole meds with puree Compensations Minimize environmental distractions;Slow rate;Small sips/bites Postural Changes Seated upright at 90 degrees   CHL IP OTHER RECOMMENDATIONS 09/29/2018 Recommended Consults -- Oral Care Recommendations Oral care BID Other Recommendations Order thickener from pharmacy   CHL IP FOLLOW UP RECOMMENDATIONS 09/29/2018 Follow up Recommendations Skilled Nursing facility   [facility] IP FREQUENCY AND DURATION 09/29/2018 Speech Therapy Frequency (ACUTE ONLY) min 2x/week Treatment Duration 2 weeks      CHL IP ORAL PHASE 09/29/2018 Oral Phase Impaired Oral - Pudding Teaspoon --  Oral - Pudding Cup -- Oral - Honey Teaspoon -- Oral - Honey Cup -- Oral - Nectar Teaspoon -- Oral - Nectar Cup Lingual/palatal residue;Premature spillage Oral - Nectar Straw -- Oral - Thin Teaspoon -- Oral - Thin Cup Holding of bolus Oral - Thin Straw -- Oral - Puree Lingual pumping;Delayed oral transit Oral - Mech Soft -- Oral - Regular Delayed oral transit;Impaired mastication Oral - Multi-Consistency -- Oral - Pill -- Oral Phase - Comment --  CHL IP PHARYNGEAL PHASE 09/29/2018 Pharyngeal Phase Impaired Pharyngeal- Pudding Teaspoon -- Pharyngeal -- Pharyngeal- Pudding Cup -- Pharyngeal -- Pharyngeal- Honey Teaspoon -- Pharyngeal -- Pharyngeal- Honey Cup -- Pharyngeal -- Pharyngeal- Nectar Teaspoon -- Pharyngeal -- Pharyngeal- Nectar Cup Pharyngeal residue - valleculae;Pharyngeal residue - pyriform Pharyngeal -- Pharyngeal- Nectar Straw -- Pharyngeal -- Pharyngeal- Thin Teaspoon -- Pharyngeal -- Pharyngeal- Thin Cup Penetration/Aspiration during swallow;Compensatory strategies attempted (with notebox);Other (Comment) Pharyngeal Material enters airway, CONTACTS cords and not ejected out;Material enters airway, passes BELOW cords then ejected out Pharyngeal- Thin Straw -- Pharyngeal -- Pharyngeal- Puree WFL Pharyngeal -- Pharyngeal- Mechanical Soft -- Pharyngeal -- Pharyngeal- Regular WFL Pharyngeal -- Pharyngeal- Multi-consistency -- Pharyngeal -- Pharyngeal- Pill --  Pharyngeal -- Pharyngeal Comment --  CHL IP CERVICAL ESOPHAGEAL PHASE 09/29/2018 Cervical Esophageal Phase WFL Pudding Teaspoon -- Pudding Cup -- Honey Teaspoon -- Honey Cup -- Nectar Teaspoon -- Nectar Cup -- Nectar Straw -- Thin Teaspoon -- Thin Cup -- Thin Straw -- Puree -- Mechanical Soft -- Regular -- Multi-consistency -- Pill -- Cervical Esophageal Comment -- [place] Siren 09/29/2018, 4:47 PM Orbie Pyo Colvin Caroli.Ed Actor Pager [phone] Office [phone]               Anti-infectives: Anti-infectives (From  admission, onward)   Start     Dose/Rate Route Frequency Ordered Stop   09/29/18 1500  ampicillin (OMNIPEN) 2 g in sodium chloride 0.9 % 100 mL IVPB     2 g 300 mL/hr over 20 Minutes Intravenous Every 4 hours 09/29/18 1359     09/28/18 1400  ceFAZolin (ANCEF) IVPB 1 g/50 mL premix  Status:  Discontinued     1 g 100 mL/hr over 30 Minutes Intravenous Every 8 hours 09/28/18 1212 09/29/18 1359   09/27/18 1700  cefTRIAXone (ROCEPHIN) 2 g in sodium chloride 0.9 % 100 mL IVPB  Status:  Discontinued     2 g 200 mL/hr over 30 Minutes Intravenous Every 24 hours 09/27/18 1625 09/28/18 1212   09/26/18 0600  piperacillin-tazobactam (ZOSYN) IVPB 3.375 g  Status:  Discontinued     3.375 g 12.5 mL/hr over 240 Minutes Intravenous Every 8 hours 09/26/18 0411 09/27/18 1625       Assessment/Plan Acute cholangitis, s/p ERCP, E. coli bacteremia -s/p ERCP and labs are trending down.  CMET pending today -patient does not have evidence of cholecystitis on his imaging.  Given his current medical state with his delirium and chronic dementia, we discussed with family that given his current state that he is high risk for surgery and given he has had an ERCP with sphincterotomy and no cholecystitis, that we can likely avoid surgery. -does not need surgical follow up at this time given he has no further stones seen in his gallbladder.  Dementia/delirium CAD HTN CVA ?Dysphagia DM  FEN - per primary, may need swallow VTE - SCDs/heparin ID - ancef    LOS: 5 days    Henreitta Cea , [facility] 09/30/2018, 8:07 AM Pager: [phone]

## 2018-09-30 NOTE — Progress Notes (Signed)
PROGRESS NOTE        PATIENT DETAILS Name: Matthew Schultz Age: 84 y.o. Sex: male Date of Birth: 06/29/1932 Admit Date: 09/25/2018 Admitting Physician Tanda Rockers, MD KPT:[id number], [name], MD  Brief Narrative: Patient is a 84 y.o. male with a history of BPH, CAD, DM, dyslipidemia presented with altered mental status, patient was found to have sepsis secondary to cholangitis and E. coli bacteremia.  He underwent ERCP-and is clinically improved.  General surgery and GI following-tentatively scheduled for laparoscopic cholecystectomy in the next day or so.  Subjective: Less confused compared to the past few days-not lethargic this morning.  No chest pain or shortness of breath.  Assessment/Plan: Sepsis secondary to acute cholangitis and polymicrobial (enterococcal, E. coli) bacteremia: Sepsis pathophysiology has resolved, underwent ERCP on 10/1.  Blood cultures positive for E. coli and enterococcus-currently on amoxicillin-ID following.  Although patient ideally requires a cholecystectomy, due to significant amount of delirium, accumulating secretions,is at high risk for aspiration pneumonitis and other postoperative complications-after discussion with general surgery-we have elected not to pursue cholecystectomy at this point. No cholelithiasis seen on ultrasound-he has a sphincterotomy.  This was discussed with patient's family at bedside by this MD-they understand and are agreeable with this plan.  Please continue to slowly downtrend.  Dementia with delirium: Continues to be confused/delirious at night-have increased Risperdal to 0.5 mg nightly-follow.  Continue supportive care.    Dysphagia: Mostly oropharyngeal dysphagia-seen by speech therapy-and started on a dysphagia 3 diet.  Discussed with family on 10/3-they are aware of the risk of aspiration.  Acute kidney injury: AKI hemodynamically mediated, resolved.  Follow.  Hypokalemia: Continue to replete and  recheck.  GERD: Continue PPI.  BPH: Continue Flomax-appears stable.  DM-2: CBG stable-continue with SSI.    Incidental right adrenal adenoma: Stable for outpatient follow-up  Deconditioning/debility: Worsened by acute illness-much weaker than usual baseline-probably will require SNF on discharge.  DVT Prophylaxis: Prophylactic Heparin   Code Status: Full code   Family Communication: None at bedside  Disposition Plan: Remain inpatient-SNF at discharge-  Antimicrobial agents: Anti-infectives (From admission, onward)   Start     Dose/Rate Route Frequency Ordered Stop   09/30/18 1015  amoxicillin (AMOXIL) capsule 500 mg     500 mg Oral Every 8 hours 09/30/18 1012 10/05/18 0559   09/29/18 1500  ampicillin (OMNIPEN) 2 g in sodium chloride 0.9 % 100 mL IVPB  Status:  Discontinued     2 g 300 mL/hr over 20 Minutes Intravenous Every 4 hours 09/29/18 1359 09/30/18 1012   09/28/18 1400  ceFAZolin (ANCEF) IVPB 1 g/50 mL premix  Status:  Discontinued     1 g 100 mL/hr over 30 Minutes Intravenous Every 8 hours 09/28/18 1212 09/29/18 1359   09/27/18 1700  cefTRIAXone (ROCEPHIN) 2 g in sodium chloride 0.9 % 100 mL IVPB  Status:  Discontinued     2 g 200 mL/hr over 30 Minutes Intravenous Every 24 hours 09/27/18 1625 09/28/18 1212   09/26/18 0600  piperacillin-tazobactam (ZOSYN) IVPB 3.375 g  Status:  Discontinued     3.375 g 12.5 mL/hr over 240 Minutes Intravenous Every 8 hours 09/26/18 0411 09/27/18 1625      Procedures: 10/1>> ERCP  CONSULTS:  GI and general surgery  Time spent: 25- minutes-Greater than 50% of this time was spent in counseling, explanation of diagnosis, planning of  further management, and coordination of care.  MEDICATIONS: Scheduled Meds: . amoxicillin  500 mg Oral Q8H  . aspirin EC  81 mg Oral Daily  . donepezil  5 mg Oral Daily  . gabapentin  300 mg Oral QHS  . heparin  5,000 Units Subcutaneous Q8H  . insulin aspart  0-5 Units Subcutaneous QHS  .  insulin aspart  0-9 Units Subcutaneous TID WC  . pantoprazole  40 mg Oral Daily  . potassium chloride  40 mEq Oral BID  . risperiDONE  0.5 mg Oral QHS  . tamsulosin  0.4 mg Oral Daily   Continuous Infusions:  PRN Meds:.   PHYSICAL EXAM: Vital signs: Vitals:   09/29/18 1500 09/29/18 2011 09/30/18 0441 09/30/18 0500  BP: 110/73 127/64 127/72   Pulse: (!) 55 70 61   Resp: 18 18 16    Temp: 98.7 F (37.1 C) 98 F (36.7 C) 97.9 F (36.6 C)   TempSrc: Oral Oral Oral   SpO2: 98%  98%   Weight:    67.8 kg  Height:       Filed Weights   09/27/18 1829 09/28/18 0500 09/30/18 0500  Weight: 73.8 kg 74.2 kg 67.8 kg   Body mass index is 24.13 kg/m.   General appearance:Awake, alert, not in any distress.  Eyes:no scleral icterus. HEENT: Atraumatic and Normocephalic Neck: supple, no JVD. Resp:Good air entry bilaterally,no rales or rhonchi CVS: S1 S2 regular, no murmurs.  GI: Bowel sounds present, Non tender and not distended with no gaurding, rigidity or rebound. Extremities: B/L Lower Ext shows no edema, both legs are warm to touch Neurology:  Non focal Musculoskeletal:No digital cyanosis Skin:No Rash, warm and dry Wounds:N/A  I have personally reviewed following labs and imaging studies  LABORATORY DATA: CBC: Recent Labs  Lab 09/25/18 1411 09/26/18 0350 09/28/18 0407 09/29/18 0750  WBC 20.3* 15.4* 8.5 5.2  NEUTROABS 18.8*  --  7.3  --   HGB 13.4 12.4* 12.0* 11.5*  HCT 38.1* 36.9* 35.3* 34.7*  MCV 89.1 91.1 90.1 90.4  PLT 178 165 140* 141*    Basic Metabolic Panel: Recent Labs  Lab 09/25/18 1411 09/26/18 0356 09/28/18 0407 09/29/18 0444 09/30/18 0659  NA 140 139 133* 137 139  K 3.5 3.5 3.3* 3.2* 3.1*  CL 104 110 107 109 103  CO2 21* 20* 22 21* 24  GLUCOSE 219* 144* 205* 105* 124*  BUN 29* 26* 20 13 8   CREATININE 1.28* 1.03 1.03 0.81 0.82  CALCIUM 8.6* 8.0* 7.4* 7.7* 8.2*  MG  --  2.2  --   --   --     GFR: Estimated Creatinine Clearance: 60.5 mL/min  (by C-G formula based on SCr of 0.82 mg/dL).  Liver Function Tests: Recent Labs  Lab 09/25/18 1411 09/26/18 0356 09/28/18 0407 09/29/18 0444 09/30/18 0659  AST 538* 241* 33 29 36  ALT 319* 252* 92* 67* 60*  ALKPHOS 208* 184* 150* 145* 188*  BILITOT 5.2* 5.8* 1.2 1.4* 1.7*  PROT 7.1 5.9* 5.5* 5.2* 6.0*  ALBUMIN 3.5 2.8* 2.4* 2.4* 2.8*   No results for input(s): LIPASE, AMYLASE in the last 168 hours. No results for input(s): AMMONIA in the last 168 hours.  Coagulation Profile: No results for input(s): INR, PROTIME in the last 168 hours.  Cardiac Enzymes: Recent Labs  Lab 09/25/18 1411 09/29/18 0750  TROPONINI <0.03 <0.03    BNP (last 3 results) No results for input(s): PROBNP in the last 8760 hours.  HbA1C: Recent Labs  09/28/18 0407  HGBA1C 6.2*    CBG: Recent Labs  Lab 09/29/18 0731 09/29/18 1203 09/29/18 1642 09/29/18 2156 09/30/18 0825  GLUCAP 93 100* 114* 113* 118*    Lipid Profile: No results for input(s): CHOL, HDL, LDLCALC, TRIG, CHOLHDL, LDLDIRECT in the last 72 hours.  Thyroid Function Tests: No results for input(s): TSH, T4TOTAL, FREET4, T3FREE, THYROIDAB in the last 72 hours.  Anemia Panel: No results for input(s): VITAMINB12, FOLATE, FERRITIN, TIBC, IRON, RETICCTPCT in the last 72 hours.  Urine analysis:    Component Value Date/Time   COLORURINE AMBER (A) 09/25/2018 1440   APPEARANCEUR CLEAR (A) 09/25/2018 1440   APPEARANCEUR Clear 11/28/2013 1712   LABSPEC 1.019 09/25/2018 1440   LABSPEC 1.019 11/28/2013 1712   PHURINE 6.0 09/25/2018 1440   GLUCOSEU 50 (A) 09/25/2018 1440   GLUCOSEU Negative 11/28/2013 1712   HGBUR NEGATIVE 09/25/2018 1440   BILIRUBINUR SMALL (A) 09/25/2018 1440   BILIRUBINUR Negative 11/28/2013 1712   KETONESUR NEGATIVE 09/25/2018 1440   PROTEINUR 30 (A) 09/25/2018 1440   NITRITE NEGATIVE 09/25/2018 1440   LEUKOCYTESUR NEGATIVE 09/25/2018 1440   LEUKOCYTESUR Negative 11/28/2013 1712    Sepsis  Labs: Lactic Acid, Venous    Component Value Date/Time   LATICACIDVEN 1.7 09/26/2018 0350    MICROBIOLOGY: Recent Results (from the past 240 hour(s))  Culture, blood (routine x 2)     Status: Abnormal   Collection Time: 09/25/18  2:41 PM  Result Value Ref Range Status   Specimen Description   Final    BLOOD RAC Performed at [facility], [address] [place], [place] [address]    Special Requests   Final    BOTTLES DRAWN AEROBIC AND ANAEROBIC Blood Culture adequate volume Performed at [facility], [address] [place], [place] [address]    Culture  Setup Time   Final    GRAM NEGATIVE RODS IN BOTH AEROBIC AND ANAEROBIC BOTTLES CRITICAL RESULT CALLED TO, READ BACK BY AND VERIFIED WITH: DAVID BESANTI AT 4034 09/26/18.PMH Performed at [facility], [place] [address] [place], [place] [address]    Culture ESCHERICHIA COLI (A)  Final   Report Status 09/29/2018 FINAL  Final   Organism ID, Bacteria ESCHERICHIA COLI  Final      Susceptibility   Escherichia coli - MIC*    AMPICILLIN <=2 SENSITIVE Sensitive     CEFAZOLIN <=4 SENSITIVE Sensitive     CEFEPIME <=1 SENSITIVE Sensitive     CEFTAZIDIME <=1 SENSITIVE Sensitive     CEFTRIAXONE <=1 SENSITIVE Sensitive     CIPROFLOXACIN <=0.25 SENSITIVE Sensitive     GENTAMICIN <=1 SENSITIVE Sensitive     IMIPENEM <=0.25 SENSITIVE Sensitive     TRIMETH/SULFA <=20 SENSITIVE Sensitive     AMPICILLIN/SULBACTAM <=2 SENSITIVE Sensitive     PIP/TAZO <=4 SENSITIVE Sensitive     Extended ESBL NEGATIVE Sensitive     * ESCHERICHIA COLI  Culture, blood (routine x 2)     Status: Abnormal   Collection Time: 09/25/18  2:41 PM  Result Value Ref Range Status   Specimen Description   Final    BLOOD L AC Performed at [facility], [address], [place], [place] [address]    Special Requests   Final    BOTTLES DRAWN AEROBIC AND ANAEROBIC Blood Culture adequate volume Performed at [facility], [address] [place], [place] [address]    Culture  Setup Time   Final    GRAM NEGATIVE RODS  IN BOTH AEROBIC AND ANAEROBIC BOTTLES CRITICAL RESULT CALLED TO, READ BACK BY AND VERIFIED WITH: DAVID BESANTI AT 1610 09/26/18.PMH Performed at [facility], [address] [place], [place] [address]    Culture (A)  Final    ESCHERICHIA COLI SUSCEPTIBILITIES PERFORMED ON PREVIOUS CULTURE WITHIN THE LAST 5 DAYS. ENTEROCOCCUS CASSELIFLAVUS CRITICAL RESULT CALLED TO, READ BACK BY AND VERIFIED WITH: [place] [id number] FCP Performed at [facility], [place] [address], [place], [place] [address]    Report Status 09/30/2018 FINAL  Final   Organism ID, Bacteria ENTEROCOCCUS CASSELIFLAVUS  Final      Susceptibility   Enterococcus casseliflavus - MIC*    AMPICILLIN <=2 SENSITIVE Sensitive     VANCOMYCIN RESISTANT Resistant     GENTAMICIN SYNERGY SENSITIVE Sensitive     LINEZOLID 2 SENSITIVE Sensitive     * ENTEROCOCCUS CASSELIFLAVUS  Blood Culture ID Panel (Reflexed)     Status: Abnormal   Collection Time: 09/25/18  2:41 PM  Result Value Ref Range Status   Enterococcus species NOT DETECTED NOT DETECTED Final   Listeria monocytogenes NOT DETECTED NOT DETECTED Final   Staphylococcus species NOT DETECTED NOT DETECTED Final   Staphylococcus aureus (BCID) NOT DETECTED NOT DETECTED Final   Streptococcus species NOT DETECTED NOT DETECTED Final   Streptococcus agalactiae NOT DETECTED NOT DETECTED Final   Streptococcus pneumoniae NOT DETECTED NOT DETECTED Final   Streptococcus pyogenes NOT DETECTED NOT DETECTED Final   Acinetobacter baumannii NOT DETECTED NOT DETECTED Final   Enterobacteriaceae species DETECTED (A) NOT DETECTED Final    Comment: Enterobacteriaceae represent a large family of gram-negative bacteria, not a single organism. CRITICAL RESULT CALLED TO, READ BACK BY AND VERIFIED WITH: DAVID BESANTI AT 1914 09/26/18.PMH    Enterobacter cloacae complex NOT DETECTED NOT  DETECTED Final   Escherichia coli DETECTED (A) NOT DETECTED Final    Comment: CRITICAL RESULT CALLED TO, READ BACK BY AND VERIFIED WITH: DAVID BESANTI AT 7829 09/26/18.PMH    Klebsiella oxytoca NOT DETECTED NOT DETECTED Final   Klebsiella pneumoniae NOT DETECTED NOT DETECTED Final   Proteus species NOT DETECTED NOT DETECTED Final   Serratia marcescens NOT DETECTED NOT DETECTED Final   Carbapenem resistance NOT DETECTED NOT DETECTED Final   Haemophilus influenzae NOT DETECTED NOT DETECTED Final   Neisseria meningitidis NOT DETECTED NOT DETECTED Final   Pseudomonas aeruginosa NOT DETECTED NOT DETECTED Final   Candida albicans NOT DETECTED NOT DETECTED Final   Candida glabrata NOT DETECTED NOT DETECTED Final   Candida krusei NOT DETECTED NOT DETECTED Final   Candida parapsilosis NOT DETECTED NOT DETECTED Final   Candida tropicalis NOT DETECTED NOT DETECTED Final    Comment: Performed at [facility], [address] [place], [place] [address]  MRSA PCR Screening     Status: None   Collection Time: 09/25/18 11:18 PM  Result Value Ref Range Status   MRSA by PCR NEGATIVE NEGATIVE Final    Comment:        The GeneXpert MRSA Assay (FDA approved for NASAL specimens only), is one component of a comprehensive MRSA colonization surveillance program. It is not intended to diagnose MRSA infection nor to guide or monitor treatment for MRSA infections. Performed at [facility], [place] [address], [place], [place] [address]   Culture, blood (Routine X 2) w Reflex to ID Panel     Status: None (Preliminary result)   Collection Time: 09/29/18  2:20 PM  Result Value Ref Range  Status   Specimen Description BLOOD RIGHT ARM  Final   Special Requests   Final    BOTTLES DRAWN AEROBIC ONLY Blood Culture adequate volume   Culture   Final    NO GROWTH < 24 HOURS Performed at [facility], [address] [place], [place] [address]    Report Status PENDING  Incomplete  Culture,  blood (Routine X 2) w Reflex to ID Panel     Status: None (Preliminary result)   Collection Time: 09/29/18  2:24 PM  Result Value Ref Range Status   Specimen Description BLOOD LEFT ARM  Final   Special Requests   Final    BOTTLES DRAWN AEROBIC ONLY Blood Culture adequate volume   Culture   Final    NO GROWTH < 24 HOURS Performed at [facility], [place] [address], [place], [place] [address]    Report Status PENDING  Incomplete    RADIOLOGY STUDIES/RESULTS: Ct Head Wo Contrast  Result Date: 09/25/2018 CLINICAL DATA:  Altered LOC EXAM: CT HEAD WITHOUT CONTRAST TECHNIQUE: Contiguous axial images were obtained from the base of the skull through the vertex without intravenous contrast. COMPARISON:  MRI brain 11/29/2013, CT brain 11/28/2013 FINDINGS: Brain: No acute territorial infarction, hemorrhage or intracranial mass is visualized. Moderate-to-marked atrophy. Moderate small vessel ischemic changes of the white matter. Stable ventricle size. Vascular: No hyperdense vessel.  Carotid vascular calcification Skull: No fracture. Stable heterogenous lucency in the right parietal bone. Sinuses/Orbits: No acute finding. Other: None IMPRESSION: 1. No CT evidence for acute intracranial abnormality. 2. Atrophy with small vessel ischemic changes of the white matter. Electronically Signed   By: Donavan Foil M.D.   On: 09/25/2018 15:00   Ct Abdomen Pelvis W Contrast  Result Date: 09/25/2018 CLINICAL DATA:  Right-sided abdominal pain. EXAM: CT ABDOMEN AND PELVIS WITH CONTRAST TECHNIQUE: Multidetector CT imaging of the abdomen and pelvis was performed using the standard protocol following bolus administration of intravenous contrast. CONTRAST:  150mL ISOVUE-300 IOPAMIDOL (ISOVUE-300) INJECTION 61% COMPARISON:  Ultrasound September 25, 2018 and CT scan June 26, 2014 FINDINGS: Lower chest: No acute abnormality. Hepatobiliary: Intra and extrahepatic biliary duct dilatation is identified. The common bile duct  measures up to 2 cm. At least 2 filling defects are seen in the distal common bile duct with the largest measuring 14 mm on coronal image 41 and a smaller more distal apparent defect measuring 9 mm on coronal image 43. There is distention of the gallbladder. No liver mass identified. Portal vein is patent. Pancreas: Unremarkable. No pancreatic ductal dilatation or surrounding inflammatory changes. Spleen: Normal in size without focal abnormality. Adrenals/Urinary Tract: The left adrenal gland is normal. A low-attenuation mass in the right adrenal gland was noted to be adenoma on previous imaging. This adenoma measures nearly 2 cm. There is a small cyst in the left kidney which is otherwise normal. The left ureter is normal. A focus of air in the bladder is likely from recent catheterization. The bladder is otherwise normal. The patient has a right pelvic kidney with at least 1 cysts and severe hydronephrosis, unchanged. The ureter is not dilated. There is mild excretion into the dilated collecting system on delayed images. Stomach/Bowel: The stomach and small bowel are normal. The colon and visualized portions of the appendix are normal. Vascular/Lymphatic: Atherosclerotic changes are seen in the nonaneurysmal aorta. No adenopathy. Reproductive: Prostate is unremarkable. Other: No abdominal wall hernia or abnormality. No abdominopelvic ascites. Musculoskeletal: Anterior wedging of L3 is new since  2016. No other acute bony abnormalities. IMPRESSION: 1. Two filling defects in the distal common bile duct measuring 14 and 9 mm are consistent with choledocholithiasis resulting in intra and extrahepatic biliary duct dilatation and distention of the gallbladder. 2. 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 3. Anterior wedging of L3 is new since 2016. It is age indeterminate but I would favor the finding is nonacute. Recommend clinical correlation. 4. Right  adrenal adenoma. 5. Atherosclerotic changes in the nonaneurysmal aorta. Electronically Signed   By: Dorise Bullion III M.D   On: 09/25/2018 18:34   Dg Chest Portable 1 View  Result Date: 09/25/2018 CLINICAL DATA:  Cough and congestion. EXAM: PORTABLE CHEST 1 VIEW COMPARISON:  September 25, 2018 FINDINGS: Scoliotic curvature of the thoracic spine. Atelectasis in the left base. The heart, hila, mediastinum, lungs, and pleura are otherwise unchanged. IMPRESSION: No active disease. Electronically Signed   By: Dorise Bullion III M.D   On: 09/25/2018 18:52   Dg Chest Port 1 View  Result Date: 09/25/2018 CLINICAL DATA:  Altered mental status. EXAM: PORTABLE CHEST 1 VIEW COMPARISON:  Radiograph of October 19, 2016. FINDINGS: Stable cardiomegaly. Atherosclerosis of thoracic aorta is noted. Dextroscoliosis of thoracic spine is noted. No pneumothorax is noted. Lungs are clear. Bony thorax is unremarkable. IMPRESSION: No acute cardiopulmonary abnormality seen. Aortic Atherosclerosis (ICD10-I70.0). Electronically Signed   By: Marijo Conception, M.D.   On: 09/25/2018 14:50   Dg Ercp Biliary & Pancreatic Ducts  Result Date: 09/27/2018 CLINICAL DATA:  Bile duct stone removal EXAM: ERCP TECHNIQUE: Multiple spot images obtained with the fluoroscopic device and submitted for interpretation post-procedure. COMPARISON:  None. FINDINGS: 2 fluoroscopic spot images document endoscopic cannulation and opacification of the CBD with passage of a balloon tipped catheter. There is incomplete opacification of the intrahepatic biliary tree, which appears decompressed centrally. IMPRESSION: Endoscopic CBD cannulation and intervention. These images were submitted for radiologic interpretation only. Please see the procedural report for the amount of contrast and the fluoroscopy time utilized. Electronically Signed   By: Lucrezia Europe M.D.   On: 09/27/2018 09:25   Dg Swallowing Func-speech Pathology  Result Date: 09/29/2018 Objective  Swallowing Evaluation: Type of Study: MBS-Modified Barium Swallow Study  Patient Details Name: LAWRENCE MITCH MRN: [id number] Date of Birth: April 04, 1932 Today's Date: 09/29/2018 Time: SLP Start Time (ACUTE ONLY): 3762 -SLP Stop Time (ACUTE ONLY): 1520 SLP Time Calculation (min) (ACUTE ONLY): 16 min Past Medical History: Past Medical History: Diagnosis Date . Arthritis  . BPH (benign prostatic hyperplasia)  . CAD (coronary artery disease)  . DM (diabetes mellitus) ([place])  . HLD (hyperlipidemia)  . HTN (hypertension)  . Hydronephrosis  . Incomplete bladder emptying  . OA (osteoarthritis)  . Renal mass  . Stroke [facility])  Past Surgical History: Past Surgical History: Procedure Laterality Date . BACK SURGERY   HPI: [name] with h/o BPH, CAD, DM, HLD, HTN, OA, Renal mass, and CVA, who presented to [facility] with altered mental status and sepsis.  CT confirmed choledocholithiasis with two obstructing stones and signs of cholangitis.  He was transferred to [facility] and underwent ERCP 10/1.  CXR 9/29 without active disease.  Subjective: pt alert but minimally conversant, not following commands well Assessment / Plan / Recommendation CHL IP CLINICAL IMPRESSIONS 09/29/2018 Clinical Impression Pt presented with moderate oropharyngeal dysphagia marked by pharyngeal weakness evidenced by reduced hyolaryngeal excursion and decreased coordination/timing of swallow mechanisms. Mild lingual/palatal  residue and moderate vallecular residue noted across thin and nectar POs. Pt also penetrated thin bolus to the VF and aspirated without sensation, however volitional cough was effective to clear. While chin tuck strategy was effective in regards to its facilitation of complete airway closure with thin, pt's ability to execute commands to use the strategy was inconsistent and would therefore not be a reliable compensatory strategy. Full airway protection was present with nectar, puree, or regular textures, although prolonged mastication and oral  transit observed with puree and regular, and premature spill to the valleculae X1 noted with nectar. Given current presentation and dentition, recommend D3 diet with nectar thick liquids, use slow/small bites and sips in a minimally distracting environment, and ST will continue to provide treatment with diet safety and efficiency including opportunities for advancement.  SLP Visit Diagnosis Dysphagia, oropharyngeal phase (R13.12) Attention and concentration deficit following -- Frontal lobe and executive function deficit following -- Impact on safety and function Moderate aspiration risk   CHL IP TREATMENT RECOMMENDATION 09/29/2018 Treatment Recommendations Therapy as outlined in treatment plan below   Prognosis 09/29/2018 Prognosis for Safe Diet Advancement Good Barriers to Reach Goals Cognitive deficits Barriers/Prognosis Comment -- CHL IP DIET RECOMMENDATION 09/29/2018 SLP Diet Recommendations Dysphagia 3 (Mech soft) solids;Nectar thick liquid Liquid Administration via Cup;No straw Medication Administration Whole meds with puree Compensations Minimize environmental distractions;Slow rate;Small sips/bites Postural Changes Seated upright at 90 degrees   CHL IP OTHER RECOMMENDATIONS 09/29/2018 Recommended Consults -- Oral Care Recommendations Oral care BID Other Recommendations Order thickener from pharmacy   CHL IP FOLLOW UP RECOMMENDATIONS 09/29/2018 Follow up Recommendations Skilled Nursing facility   [facility] IP FREQUENCY AND DURATION 09/29/2018 Speech Therapy Frequency (ACUTE ONLY) min 2x/week Treatment Duration 2 weeks      CHL IP ORAL PHASE 09/29/2018 Oral Phase Impaired Oral - Pudding Teaspoon -- Oral - Pudding Cup -- Oral - Honey Teaspoon -- Oral - Honey Cup -- Oral - Nectar Teaspoon -- Oral - Nectar Cup Lingual/palatal residue;Premature spillage Oral - Nectar Straw -- Oral - Thin Teaspoon -- Oral - Thin Cup Holding of bolus Oral - Thin Straw -- Oral - Puree Lingual pumping;Delayed oral transit Oral - Mech Soft -- Oral  - Regular Delayed oral transit;Impaired mastication Oral - Multi-Consistency -- Oral - Pill -- Oral Phase - Comment --  CHL IP PHARYNGEAL PHASE 09/29/2018 Pharyngeal Phase Impaired Pharyngeal- Pudding Teaspoon -- Pharyngeal -- Pharyngeal- Pudding Cup -- Pharyngeal -- Pharyngeal- Honey Teaspoon -- Pharyngeal -- Pharyngeal- Honey Cup -- Pharyngeal -- Pharyngeal- Nectar Teaspoon -- Pharyngeal -- Pharyngeal- Nectar Cup Pharyngeal residue - valleculae;Pharyngeal residue - pyriform Pharyngeal -- Pharyngeal- Nectar Straw -- Pharyngeal -- Pharyngeal- Thin Teaspoon -- Pharyngeal -- Pharyngeal- Thin Cup Penetration/Aspiration during swallow;Compensatory strategies attempted (with notebox);Other (Comment) Pharyngeal Material enters airway, CONTACTS cords and not ejected out;Material enters airway, passes BELOW cords then ejected out Pharyngeal- Thin Straw -- Pharyngeal -- Pharyngeal- Puree WFL Pharyngeal -- Pharyngeal- Mechanical Soft -- Pharyngeal -- Pharyngeal- Regular WFL Pharyngeal -- Pharyngeal- Multi-consistency -- Pharyngeal -- Pharyngeal- Pill -- Pharyngeal -- Pharyngeal Comment --  CHL IP CERVICAL ESOPHAGEAL PHASE 09/29/2018 Cervical Esophageal Phase WFL Pudding Teaspoon -- Pudding Cup -- Honey Teaspoon -- Honey Cup -- Nectar Teaspoon -- Nectar Cup -- Nectar Straw -- Thin Teaspoon -- Thin Cup -- Thin Straw -- Puree -- Mechanical Soft -- Regular -- Multi-consistency -- Pill -- Cervical Esophageal Comment -- [place] Siren 09/29/2018, 4:47 PM Orbie Pyo Litaker M.Ed Risk analyst [phone] Office [phone]  US Abdomen Limited Ruq  Result Date: 09/25/2018 CLINICAL DATA:  Elevated LFTs EXAM: ULTRASOUND ABDOMEN LIMITED RIGHT UPPER QUADRANT COMPARISON:  None. FINDINGS: Gallbladder: Small amount of sludge. No cholelithiasis. No pericholecystic fluid. Thickened gallbladder wall measuring up to 4.5 mm. Negative sonographic Murphy sign. Common bile duct: Diameter: 8 mm distally. No  obstructing mass or choledocholithiasis. Liver: No focal lesion identified. Within normal limits in parenchymal echogenicity. Portal vein is patent on color Doppler imaging with normal direction of blood flow towards the liver. IMPRESSION: No cholelithiasis. Mild gallbladder wall thickening which may be reactive secondary to hepatocellular disease given the elevated LFTs. No sonographic evidence of acute cholecystitis. Electronically Signed   By: Kathreen Devoid   On: 09/25/2018 16:37     LOS: 5 days   Oren Binet, MD  [facility]  If 7PM-7AM, please contact night-coverage  Please page via [email or web address] [email or web address]-click on MD name and type text message  09/30/2018, 11:21 AM

## 2018-09-30 NOTE — Progress Notes (Addendum)
12:54pm-Patient's daughter, [name], contacted CSW and selected [facility]. They will begin insurance authorization.   12:35pm-Attempted patient's daughter, [name], again and received voicemail. Called patient's wife, but she had no recollection of the conversation in the patient's room yesterday. She stated that patient was with her daughter at her house (patient is in the hospital). Patient's wife unreliable, so will wait on call back from daughter before initiating insurance authorization.   10:44am-CSW left voicemail for patient's daughter regarding SNF choice. No family in room.   Percell Locus Malichi Palardy LCSW [phone]

## 2018-09-30 NOTE — Progress Notes (Signed)
  Speech Language Pathology Treatment: Dysphagia  Patient Details Name: Matthew Schultz MRN: [id number] DOB: Nov 30, 1932 Today's Date: 09/30/2018 Time: 3704-8889 SLP Time Calculation (min) (ACUTE ONLY): 11 min  Assessment / Plan / Recommendation Clinical Impression  Pt pleasantly confused and unable to appreciate current dysphagia and treatment. Continues with congestion with cough prior to and after slight delay following nectar thick juice. Family present yesterday during [place] but not at bedside presently. Suspect liquid modification may be shorter term while he regains strength and stamina from current illness. Will continue to follow to provide education and safe modifications.    HPI HPI: [name] with h/o BPH, CAD, DM, HLD, HTN, OA, Renal mass, and CVA, who presented to [facility] with altered mental status and sepsis.  CT confirmed choledocholithiasis with two obstructing stones and signs of cholangitis.  He was transferred to [facility] and underwent ERCP 10/1.  CXR 9/29 without active disease.      SLP Plan  Continue with current plan of care       Recommendations  Diet recommendations: Dysphagia 3 (mechanical soft);Nectar-thick liquid Liquids provided via: Cup;No straw Medication Administration: Whole meds with puree Supervision: Patient able to self feed;Intermittent supervision to cue for compensatory strategies Compensations: Minimize environmental distractions;Slow rate;Small sips/bites Postural Changes and/or Swallow Maneuvers: Seated upright 90 degrees                Oral Care Recommendations: Oral care BID Follow up Recommendations: Skilled Nursing facility SLP Visit Diagnosis: Dysphagia, oropharyngeal phase (R13.12) Plan: Continue with current plan of care       GO                [place] Siren 09/30/2018, 9:05 AM  Orbie Pyo Colvin Caroli.Ed Risk analyst [phone] Office [phone]

## 2018-10-01 LAB — COMPREHENSIVE METABOLIC PANEL
ALT: 50 U/L — AB (ref 0–44)
AST: 30 U/L (ref 15–41)
Albumin: 2.6 g/dL — ABNORMAL LOW (ref 3.5–5.0)
Alkaline Phosphatase: 179 U/L — ABNORMAL HIGH (ref 38–126)
Anion gap: 8 (ref 5–15)
BILIRUBIN TOTAL: 1.3 mg/dL — AB (ref 0.3–1.2)
BUN: 11 mg/dL (ref 8–23)
CALCIUM: 8.1 mg/dL — AB (ref 8.9–10.3)
CHLORIDE: 107 mmol/L (ref 98–111)
CO2: 21 mmol/L — ABNORMAL LOW (ref 22–32)
Creatinine, Ser: 0.87 mg/dL (ref 0.61–1.24)
GLUCOSE: 147 mg/dL — AB (ref 70–99)
Potassium: 3.9 mmol/L (ref 3.5–5.1)
Sodium: 136 mmol/L (ref 135–145)
Total Protein: 5.5 g/dL — ABNORMAL LOW (ref 6.5–8.1)

## 2018-10-01 LAB — GLUCOSE, CAPILLARY
GLUCOSE-CAPILLARY: 105 mg/dL — AB (ref 70–99)
GLUCOSE-CAPILLARY: 100 mg/dL — AB (ref 70–99)
Glucose-Capillary: 149 mg/dL — ABNORMAL HIGH (ref 70–99)
Glucose-Capillary: 133 mg/dL — ABNORMAL HIGH (ref 70–99)

## 2018-10-01 NOTE — Progress Notes (Signed)
PROGRESS NOTE        PATIENT DETAILS Name: Matthew Schultz Age: 84 y.o. Sex: male Date of Birth: December 16, 1932 Admit Date: 09/25/2018 Admitting Physician Tanda Rockers, MD [id number], [name], MD  Brief Narrative: Patient is a 84 y.o. male with a history of BPH, CAD, DM, dyslipidemia presented with altered mental status, patient was found to have sepsis secondary to cholangitis and E. coli bacteremia.  He underwent ERCP-and is clinically improved.  General surgery and GI following-tentatively scheduled for laparoscopic cholecystectomy in the next day or so.  Subjective: Awake even somewhat confused-answers some questions appropriately.  No family at bedside.  Assessment/Plan: Sepsis secondary to acute cholangitis and polymicrobial (enterococcal, E. coli) bacteremia: Sepsis pathophysiology has resolved-underwent ERCP on 10/1, blood cultures positive for E. coli enterococcus-evaluated by ID-plans are to continue with amoxicillin.  Due to advanced age-overall frailty-aspiration pneumonia-and significant issues with delirium-not felt to be optimal candidate for cholecystectomy.  He has a sphincterotomy in place-and does not have documented gallstones on imaging.  This MD and general surgery discussed with family-they are aware of the difficult situation and agreeable to move watching without performing a cholecystectomy.   Dementia with delirium: Suspect will continue to be delirious-continue risperidone.  Follow.    Dysphagia: Evaluated by speech therapy-started on dysphagia 3 diet.  I discussed this with patient's spouse, daughter at bedside on 10/3-they are accepting risks of aspiration.  Acute kidney injury: Hemodynamically mediated-resolved.  Hypokalemia: Repleted-recheck periodically  GERD: Continue PPI  BPH: Continue Flomax  DM-2: CBGs stable with SSI.    Incidental right adrenal adenoma: Stable for outpatient follow-up  Deconditioning/debility: Worsened  by acute illness-much weaker than usual baseline-probably will require SNF on discharge.  DVT Prophylaxis: Prophylactic Heparin   Code Status: Full code   Family Communication: None at bedside  Disposition Plan: Remain inpatient-SNF at discharge-  Antimicrobial agents: Anti-infectives (From admission, onward)   Start     Dose/Rate Route Frequency Ordered Stop   09/30/18 1015  amoxicillin (AMOXIL) capsule 500 mg     500 mg Oral Every 8 hours 09/30/18 1012 10/05/18 0559   09/29/18 1500  ampicillin (OMNIPEN) 2 g in sodium chloride 0.9 % 100 mL IVPB  Status:  Discontinued     2 g 300 mL/hr over 20 Minutes Intravenous Every 4 hours 09/29/18 1359 09/30/18 1012   09/28/18 1400  ceFAZolin (ANCEF) IVPB 1 g/50 mL premix  Status:  Discontinued     1 g 100 mL/hr over 30 Minutes Intravenous Every 8 hours 09/28/18 1212 09/29/18 1359   09/27/18 1700  cefTRIAXone (ROCEPHIN) 2 g in sodium chloride 0.9 % 100 mL IVPB  Status:  Discontinued     2 g 200 mL/hr over 30 Minutes Intravenous Every 24 hours 09/27/18 1625 09/28/18 1212   09/26/18 0600  piperacillin-tazobactam (ZOSYN) IVPB 3.375 g  Status:  Discontinued     3.375 g 12.5 mL/hr over 240 Minutes Intravenous Every 8 hours 09/26/18 0411 09/27/18 1625      Procedures: 10/1>> ERCP  CONSULTS:  GI and general surgery  Time spent: 25- minutes-Greater than 50% of this time was spent in counseling, explanation of diagnosis, planning of further management, and coordination of care.  MEDICATIONS: Scheduled Meds: . amoxicillin  500 mg Oral Q8H  . aspirin EC  81 mg Oral Daily  . donepezil  5 mg Oral Daily  .  gabapentin  300 mg Oral QHS  . heparin  5,000 Units Subcutaneous Q8H  . insulin aspart  0-5 Units Subcutaneous QHS  . insulin aspart  0-9 Units Subcutaneous TID WC  . pantoprazole  40 mg Oral Daily  . potassium chloride  40 mEq Oral BID  . risperiDONE  0.5 mg Oral QHS  . tamsulosin  0.4 mg Oral Daily   Continuous Infusions:  PRN  Meds:.   PHYSICAL EXAM: Vital signs: Vitals:   09/30/18 0500 09/30/18 1320 09/30/18 2118 10/01/18 0500  BP:  133/82 (!) 147/82   Pulse:  65 83   Resp:  16 18   Temp:  98 F (36.7 C) 98.2 F (36.8 C)   TempSrc:  Oral Oral   SpO2:  97% 96%   Weight: 67.8 kg   68 kg  Height:       Filed Weights   09/28/18 0500 09/30/18 0500 10/01/18 0500  Weight: 74.2 kg 67.8 kg 68 kg   Body mass index is 24.2 kg/m.   General appearance:Awake and confused.  Chronically sick appearing. Eyes:no scleral icterus. HEENT: Atraumatic and Normocephalic Neck: supple, no JVD. Resp:Good air entry bilaterally,no rales or rhonchi CVS: S1 S2 regular, no murmurs.  GI: Bowel sounds present, Non tender and not distended with no gaurding, rigidity or rebound. Extremities: B/L Lower Ext shows no edema, both legs are warm to touch Neurology:  Non focal Musculoskeletal:No digital cyanosis Skin:No Rash, warm and dry Wounds:N/A  I have personally reviewed following labs and imaging studies  LABORATORY DATA: CBC: Recent Labs  Lab 09/25/18 1411 09/26/18 0350 09/28/18 0407 09/29/18 0750  WBC 20.3* 15.4* 8.5 5.2  NEUTROABS 18.8*  --  7.3  --   HGB 13.4 12.4* 12.0* 11.5*  HCT 38.1* 36.9* 35.3* 34.7*  MCV 89.1 91.1 90.1 90.4  PLT 178 165 140* 141*    Basic Metabolic Panel: Recent Labs  Lab 09/26/18 0356 09/28/18 0407 09/29/18 0444 09/30/18 0659 10/01/18 0401  NA 139 133* 137 139 136  K 3.5 3.3* 3.2* 3.1* 3.9  CL 110 107 109 103 107  CO2 20* 22 21* 24 21*  GLUCOSE 144* 205* 105* 124* 147*  BUN 26* 20 13 8 11   CREATININE 1.03 1.03 0.81 0.82 0.87  CALCIUM 8.0* 7.4* 7.7* 8.2* 8.1*  MG 2.2  --   --   --   --     GFR: Estimated Creatinine Clearance: 57 mL/min (by C-G formula based on SCr of 0.87 mg/dL).  Liver Function Tests: Recent Labs  Lab 09/26/18 0356 09/28/18 0407 09/29/18 0444 09/30/18 0659 10/01/18 0401  AST 241* 33 29 36 30  ALT 252* 92* 67* 60* 50*  ALKPHOS 184* 150* 145*  188* 179*  BILITOT 5.8* 1.2 1.4* 1.7* 1.3*  PROT 5.9* 5.5* 5.2* 6.0* 5.5*  ALBUMIN 2.8* 2.4* 2.4* 2.8* 2.6*   No results for input(s): LIPASE, AMYLASE in the last 168 hours. No results for input(s): AMMONIA in the last 168 hours.  Coagulation Profile: No results for input(s): INR, PROTIME in the last 168 hours.  Cardiac Enzymes: Recent Labs  Lab 09/25/18 1411 09/29/18 0750  TROPONINI <0.03 <0.03    BNP (last 3 results) No results for input(s): PROBNP in the last 8760 hours.  HbA1C: No results for input(s): HGBA1C in the last 72 hours.  CBG: Recent Labs  Lab 09/30/18 1317 09/30/18 1718 09/30/18 2158 10/01/18 0805 10/01/18 1300  GLUCAP 143* 120* 142* 149* 133*    Lipid Profile: No results for  input(s): CHOL, HDL, LDLCALC, TRIG, CHOLHDL, LDLDIRECT in the last 72 hours.  Thyroid Function Tests: No results for input(s): TSH, T4TOTAL, FREET4, T3FREE, THYROIDAB in the last 72 hours.  Anemia Panel: No results for input(s): VITAMINB12, FOLATE, FERRITIN, TIBC, IRON, RETICCTPCT in the last 72 hours.  Urine analysis:    Component Value Date/Time   COLORURINE AMBER (A) 09/25/2018 1440   APPEARANCEUR CLEAR (A) 09/25/2018 1440   APPEARANCEUR Clear 11/28/2013 1712   LABSPEC 1.019 09/25/2018 1440   LABSPEC 1.019 11/28/2013 1712   PHURINE 6.0 09/25/2018 1440   GLUCOSEU 50 (A) 09/25/2018 1440   GLUCOSEU Negative 11/28/2013 1712   HGBUR NEGATIVE 09/25/2018 1440   BILIRUBINUR SMALL (A) 09/25/2018 1440   BILIRUBINUR Negative 11/28/2013 1712   KETONESUR NEGATIVE 09/25/2018 1440   PROTEINUR 30 (A) 09/25/2018 1440   NITRITE NEGATIVE 09/25/2018 1440   LEUKOCYTESUR NEGATIVE 09/25/2018 1440   LEUKOCYTESUR Negative 11/28/2013 1712    Sepsis Labs: Lactic Acid, Venous    Component Value Date/Time   LATICACIDVEN 1.7 09/26/2018 0350    MICROBIOLOGY: Recent Results (from the past 240 hour(s))  Culture, blood (routine x 2)     Status: Abnormal   Collection Time: 09/25/18   2:41 PM  Result Value Ref Range Status   Specimen Description   Final    BLOOD RAC Performed at [facility], [address], [place], [place] [address]    Special Requests   Final    BOTTLES DRAWN AEROBIC AND ANAEROBIC Blood Culture adequate volume Performed at [facility], [address] [place], [place] [address]    Culture  Setup Time   Final    GRAM NEGATIVE RODS IN BOTH AEROBIC AND ANAEROBIC BOTTLES CRITICAL RESULT CALLED TO, READ BACK BY AND VERIFIED WITH: DAVID BESANTI AT 0981 09/26/18.PMH Performed at [facility], [place] [address], [place], [place] [address]    Culture ESCHERICHIA COLI (A)  Final   Report Status 09/29/2018 FINAL  Final   Organism ID, Bacteria ESCHERICHIA COLI  Final      Susceptibility   Escherichia coli - MIC*    AMPICILLIN <=2 SENSITIVE Sensitive     CEFAZOLIN <=4 SENSITIVE Sensitive     CEFEPIME <=1 SENSITIVE Sensitive     CEFTAZIDIME <=1 SENSITIVE Sensitive     CEFTRIAXONE <=1 SENSITIVE Sensitive     CIPROFLOXACIN <=0.25 SENSITIVE Sensitive     GENTAMICIN <=1 SENSITIVE Sensitive     IMIPENEM <=0.25 SENSITIVE Sensitive     TRIMETH/SULFA <=20 SENSITIVE Sensitive     AMPICILLIN/SULBACTAM <=2 SENSITIVE Sensitive     PIP/TAZO <=4 SENSITIVE Sensitive     Extended ESBL NEGATIVE Sensitive     * ESCHERICHIA COLI  Culture, blood (routine x 2)     Status: Abnormal   Collection Time: 09/25/18  2:41 PM  Result Value Ref Range Status   Specimen Description   Final    BLOOD L AC Performed at [facility], [address], [place], [place] [address]    Special Requests   Final    BOTTLES DRAWN AEROBIC AND ANAEROBIC Blood Culture adequate volume Performed at [facility], [address] [place], [place] [address]    Culture  Setup Time   Final    GRAM NEGATIVE RODS IN BOTH AEROBIC AND ANAEROBIC BOTTLES CRITICAL RESULT CALLED TO, READ BACK BY AND VERIFIED WITH: DAVID BESANTI AT 6578 09/26/18.PMH Performed at  [facility], [address] [place], [place] [address]    Culture (A)  Final    ESCHERICHIA COLI SUSCEPTIBILITIES PERFORMED ON PREVIOUS CULTURE WITHIN THE LAST 5 DAYS. ENTEROCOCCUS CASSELIFLAVUS CRITICAL RESULT CALLED TO, READ BACK BY AND VERIFIED WITH: [place] [id number] FCP Performed at [facility], [place] [address], [place], [place] [address]    Report Status 09/30/2018 FINAL  Final   Organism ID, Bacteria ENTEROCOCCUS CASSELIFLAVUS  Final      Susceptibility   Enterococcus casseliflavus - MIC*    AMPICILLIN <=2 SENSITIVE Sensitive     VANCOMYCIN RESISTANT Resistant     GENTAMICIN SYNERGY SENSITIVE Sensitive     LINEZOLID 2 SENSITIVE Sensitive     * ENTEROCOCCUS CASSELIFLAVUS  Blood Culture ID Panel (Reflexed)     Status: Abnormal   Collection Time: 09/25/18  2:41 PM  Result Value Ref Range Status   Enterococcus species NOT DETECTED NOT DETECTED Final   Listeria monocytogenes NOT DETECTED NOT DETECTED Final   Staphylococcus species NOT DETECTED NOT DETECTED Final   Staphylococcus aureus (BCID) NOT DETECTED NOT DETECTED Final   Streptococcus species NOT DETECTED NOT DETECTED Final   Streptococcus agalactiae NOT DETECTED NOT DETECTED Final   Streptococcus pneumoniae NOT DETECTED NOT DETECTED Final   Streptococcus pyogenes NOT DETECTED NOT DETECTED Final   Acinetobacter baumannii NOT DETECTED NOT DETECTED Final   Enterobacteriaceae species DETECTED (A) NOT DETECTED Final    Comment: Enterobacteriaceae represent a large family of gram-negative bacteria, not a single organism. CRITICAL RESULT CALLED TO, READ BACK BY AND VERIFIED WITH: DAVID BESANTI AT 7510 09/26/18.PMH    Enterobacter cloacae complex NOT DETECTED NOT DETECTED Final   Escherichia coli DETECTED (A) NOT DETECTED Final    Comment: CRITICAL RESULT CALLED TO, READ BACK BY AND VERIFIED WITH: DAVID BESANTI AT 2585 09/26/18.PMH    Klebsiella oxytoca NOT DETECTED NOT DETECTED Final   Klebsiella  pneumoniae NOT DETECTED NOT DETECTED Final   Proteus species NOT DETECTED NOT DETECTED Final   Serratia marcescens NOT DETECTED NOT DETECTED Final   Carbapenem resistance NOT DETECTED NOT DETECTED Final   Haemophilus influenzae NOT DETECTED NOT DETECTED Final   Neisseria meningitidis NOT DETECTED NOT DETECTED Final   Pseudomonas aeruginosa NOT DETECTED NOT DETECTED Final   Candida albicans NOT DETECTED NOT DETECTED Final   Candida glabrata NOT DETECTED NOT DETECTED Final   Candida krusei NOT DETECTED NOT DETECTED Final   Candida parapsilosis NOT DETECTED NOT DETECTED Final   Candida tropicalis NOT DETECTED NOT DETECTED Final    Comment: Performed at [facility], [address] [place], [place] [address]  MRSA PCR Screening     Status: None   Collection Time: 09/25/18 11:18 PM  Result Value Ref Range Status   MRSA by PCR NEGATIVE NEGATIVE Final    Comment:        The GeneXpert MRSA Assay (FDA approved for NASAL specimens only), is one component of a comprehensive MRSA colonization surveillance program. It is not intended to diagnose MRSA infection nor to guide or monitor treatment for MRSA infections. Performed at [facility], [place] [address] [place], [place] [address]   Culture, blood (Routine X 2) w Reflex to ID Panel     Status: None (Preliminary result)   Collection Time: 09/29/18  2:20 PM  Result Value Ref Range Status   Specimen Description BLOOD RIGHT ARM  Final   Special Requests   Final    BOTTLES DRAWN AEROBIC ONLY Blood Culture adequate volume   Culture   Final    NO GROWTH  1 DAY Performed at [facility], [place] [address] [place], [place] [address]    Report Status PENDING  Incomplete  Culture, blood (Routine X 2) w Reflex to ID Panel     Status: None (Preliminary result)   Collection Time: 09/29/18  2:24 PM  Result Value Ref Range Status   Specimen Description BLOOD LEFT ARM  Final   Special Requests   Final    BOTTLES DRAWN AEROBIC  ONLY Blood Culture adequate volume   Culture   Final    NO GROWTH 1 DAY Performed at [facility], [place] [address] [place], [place] [address]    Report Status PENDING  Incomplete    RADIOLOGY STUDIES/RESULTS: Ct Head Wo Contrast  Result Date: 09/25/2018 CLINICAL DATA:  Altered LOC EXAM: CT HEAD WITHOUT CONTRAST TECHNIQUE: Contiguous axial images were obtained from the base of the skull through the vertex without intravenous contrast. COMPARISON:  MRI brain 11/29/2013, CT brain 11/28/2013 FINDINGS: Brain: No acute territorial infarction, hemorrhage or intracranial mass is visualized. Moderate-to-marked atrophy. Moderate small vessel ischemic changes of the white matter. Stable ventricle size. Vascular: No hyperdense vessel.  Carotid vascular calcification Skull: No fracture. Stable heterogenous lucency in the right parietal bone. Sinuses/Orbits: No acute finding. Other: None IMPRESSION: 1. No CT evidence for acute intracranial abnormality. 2. Atrophy with small vessel ischemic changes of the white matter. Electronically Signed   By: Donavan Foil M.D.   On: 09/25/2018 15:00   Ct Abdomen Pelvis W Contrast  Result Date: 09/25/2018 CLINICAL DATA:  Right-sided abdominal pain. EXAM: CT ABDOMEN AND PELVIS WITH CONTRAST TECHNIQUE: Multidetector CT imaging of the abdomen and pelvis was performed using the standard protocol following bolus administration of intravenous contrast. CONTRAST:  189mL ISOVUE-300 IOPAMIDOL (ISOVUE-300) INJECTION 61% COMPARISON:  Ultrasound September 25, 2018 and CT scan June 26, 2014 FINDINGS: Lower chest: No acute abnormality. Hepatobiliary: Intra and extrahepatic biliary duct dilatation is identified. The common bile duct measures up to 2 cm. At least 2 filling defects are seen in the distal common bile duct with the largest measuring 14 mm on coronal image 41 and a smaller more distal apparent defect measuring 9 mm on coronal image 43. There is distention of the gallbladder.  No liver mass identified. Portal vein is patent. Pancreas: Unremarkable. No pancreatic ductal dilatation or surrounding inflammatory changes. Spleen: Normal in size without focal abnormality. Adrenals/Urinary Tract: The left adrenal gland is normal. A low-attenuation mass in the right adrenal gland was noted to be adenoma on previous imaging. This adenoma measures nearly 2 cm. There is a small cyst in the left kidney which is otherwise normal. The left ureter is normal. A focus of air in the bladder is likely from recent catheterization. The bladder is otherwise normal. The patient has a right pelvic kidney with at least 1 cysts and severe hydronephrosis, unchanged. The ureter is not dilated. There is mild excretion into the dilated collecting system on delayed images. Stomach/Bowel: The stomach and small bowel are normal. The colon and visualized portions of the appendix are normal. Vascular/Lymphatic: Atherosclerotic changes are seen in the nonaneurysmal aorta. No adenopathy. Reproductive: Prostate is unremarkable. Other: No abdominal wall hernia or abnormality. No abdominopelvic ascites. Musculoskeletal: Anterior wedging of L3 is new since 2016. No other acute bony abnormalities. IMPRESSION: 1. Two filling defects in the distal common bile duct measuring 14 and 9 mm are consistent with choledocholithiasis resulting in intra and extrahepatic biliary duct dilatation and distention of the gallbladder. 2. 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 3. Anterior wedging of L3 is new since 2016. It is age indeterminate but I would favor the finding is nonacute. Recommend clinical correlation. 4. Right adrenal adenoma. 5. Atherosclerotic changes in the nonaneurysmal aorta. Electronically Signed   By: Dorise Bullion III M.D   On: 09/25/2018 18:34   Dg Chest Portable 1 View  Result Date: 09/25/2018 CLINICAL DATA:  Cough and congestion. EXAM: PORTABLE CHEST 1  VIEW COMPARISON:  September 25, 2018 FINDINGS: Scoliotic curvature of the thoracic spine. Atelectasis in the left base. The heart, hila, mediastinum, lungs, and pleura are otherwise unchanged. IMPRESSION: No active disease. Electronically Signed   By: Dorise Bullion III M.D   On: 09/25/2018 18:52   Dg Chest Port 1 View  Result Date: 09/25/2018 CLINICAL DATA:  Altered mental status. EXAM: PORTABLE CHEST 1 VIEW COMPARISON:  Radiograph of October 19, 2016. FINDINGS: Stable cardiomegaly. Atherosclerosis of thoracic aorta is noted. Dextroscoliosis of thoracic spine is noted. No pneumothorax is noted. Lungs are clear. Bony thorax is unremarkable. IMPRESSION: No acute cardiopulmonary abnormality seen. Aortic Atherosclerosis (ICD10-I70.0). Electronically Signed   By: Marijo Conception, M.D.   On: 09/25/2018 14:50   Dg Ercp Biliary & Pancreatic Ducts  Result Date: 09/27/2018 CLINICAL DATA:  Bile duct stone removal EXAM: ERCP TECHNIQUE: Multiple spot images obtained with the fluoroscopic device and submitted for interpretation post-procedure. COMPARISON:  None. FINDINGS: 2 fluoroscopic spot images document endoscopic cannulation and opacification of the CBD with passage of a balloon tipped catheter. There is incomplete opacification of the intrahepatic biliary tree, which appears decompressed centrally. IMPRESSION: Endoscopic CBD cannulation and intervention. These images were submitted for radiologic interpretation only. Please see the procedural report for the amount of contrast and the fluoroscopy time utilized. Electronically Signed   By: Lucrezia Europe M.D.   On: 09/27/2018 09:25   Dg Swallowing Func-speech Pathology  Result Date: 09/29/2018 Objective Swallowing Evaluation: Type of Study: MBS-Modified Barium Swallow Study  Patient Details Name: Matthew Schultz MRN: [id number] Date of Birth: Jun 03, 1932 Today's Date: 09/29/2018 Time: SLP Start Time (ACUTE ONLY): 7829 -SLP Stop Time (ACUTE ONLY): 1520 SLP Time  Calculation (min) (ACUTE ONLY): 16 min Past Medical History: Past Medical History: Diagnosis Date . Arthritis  . BPH (benign prostatic hyperplasia)  . CAD (coronary artery disease)  . DM (diabetes mellitus) ([place])  . HLD (hyperlipidemia)  . HTN (hypertension)  . Hydronephrosis  . Incomplete bladder emptying  . OA (osteoarthritis)  . Renal mass  . Stroke [facility])  Past Surgical History: Past Surgical History: Procedure Laterality Date . BACK SURGERY   HPI: [name] with h/o BPH, CAD, DM, HLD, HTN, OA, Renal mass, and CVA, who presented to [facility] with altered mental status and sepsis.  CT confirmed choledocholithiasis with two obstructing stones and signs of cholangitis.  He was transferred to [facility] and underwent ERCP 10/1.  CXR 9/29 without active disease.  Subjective: pt alert but minimally conversant, not following commands well Assessment / Plan / Recommendation CHL IP CLINICAL IMPRESSIONS 09/29/2018 Clinical Impression Pt presented with moderate oropharyngeal dysphagia marked by pharyngeal weakness evidenced by reduced hyolaryngeal excursion and decreased coordination/timing of swallow mechanisms. Mild lingual/palatal residue and moderate vallecular residue noted across thin and nectar POs. Pt also penetrated thin bolus to the VF and aspirated without sensation, however volitional cough was effective to clear. While chin tuck strategy was effective in regards to its facilitation  of complete airway closure with thin, pt's ability to execute commands to use the strategy was inconsistent and would therefore not be a reliable compensatory strategy. Full airway protection was present with nectar, puree, or regular textures, although prolonged mastication and oral transit observed with puree and regular, and premature spill to the valleculae X1 noted with nectar. Given current presentation and dentition, recommend D3 diet with nectar thick liquids, use slow/small bites and sips in a minimally distracting environment,  and ST will continue to provide treatment with diet safety and efficiency including opportunities for advancement.  SLP Visit Diagnosis Dysphagia, oropharyngeal phase (R13.12) Attention and concentration deficit following -- Frontal lobe and executive function deficit following -- Impact on safety and function Moderate aspiration risk   CHL IP TREATMENT RECOMMENDATION 09/29/2018 Treatment Recommendations Therapy as outlined in treatment plan below   Prognosis 09/29/2018 Prognosis for Safe Diet Advancement Good Barriers to Reach Goals Cognitive deficits Barriers/Prognosis Comment -- CHL IP DIET RECOMMENDATION 09/29/2018 SLP Diet Recommendations Dysphagia 3 (Mech soft) solids;Nectar thick liquid Liquid Administration via Cup;No straw Medication Administration Whole meds with puree Compensations Minimize environmental distractions;Slow rate;Small sips/bites Postural Changes Seated upright at 90 degrees   CHL IP OTHER RECOMMENDATIONS 09/29/2018 Recommended Consults -- Oral Care Recommendations Oral care BID Other Recommendations Order thickener from pharmacy   CHL IP FOLLOW UP RECOMMENDATIONS 09/29/2018 Follow up Recommendations Skilled Nursing facility   [facility] IP FREQUENCY AND DURATION 09/29/2018 Speech Therapy Frequency (ACUTE ONLY) min 2x/week Treatment Duration 2 weeks      CHL IP ORAL PHASE 09/29/2018 Oral Phase Impaired Oral - Pudding Teaspoon -- Oral - Pudding Cup -- Oral - Honey Teaspoon -- Oral - Honey Cup -- Oral - Nectar Teaspoon -- Oral - Nectar Cup Lingual/palatal residue;Premature spillage Oral - Nectar Straw -- Oral - Thin Teaspoon -- Oral - Thin Cup Holding of bolus Oral - Thin Straw -- Oral - Puree Lingual pumping;Delayed oral transit Oral - Mech Soft -- Oral - Regular Delayed oral transit;Impaired mastication Oral - Multi-Consistency -- Oral - Pill -- Oral Phase - Comment --  CHL IP PHARYNGEAL PHASE 09/29/2018 Pharyngeal Phase Impaired Pharyngeal- Pudding Teaspoon -- Pharyngeal -- Pharyngeal- Pudding Cup --  Pharyngeal -- Pharyngeal- Honey Teaspoon -- Pharyngeal -- Pharyngeal- Honey Cup -- Pharyngeal -- Pharyngeal- Nectar Teaspoon -- Pharyngeal -- Pharyngeal- Nectar Cup Pharyngeal residue - valleculae;Pharyngeal residue - pyriform Pharyngeal -- Pharyngeal- Nectar Straw -- Pharyngeal -- Pharyngeal- Thin Teaspoon -- Pharyngeal -- Pharyngeal- Thin Cup Penetration/Aspiration during swallow;Compensatory strategies attempted (with notebox);Other (Comment) Pharyngeal Material enters airway, CONTACTS cords and not ejected out;Material enters airway, passes BELOW cords then ejected out Pharyngeal- Thin Straw -- Pharyngeal -- Pharyngeal- Puree WFL Pharyngeal -- Pharyngeal- Mechanical Soft -- Pharyngeal -- Pharyngeal- Regular WFL Pharyngeal -- Pharyngeal- Multi-consistency -- Pharyngeal -- Pharyngeal- Pill -- Pharyngeal -- Pharyngeal Comment --  [facility] IP CERVICAL ESOPHAGEAL PHASE 09/29/2018 Cervical Esophageal Phase WFL Pudding Teaspoon -- Pudding Cup -- Honey Teaspoon -- Honey Cup -- Nectar Teaspoon -- Nectar Cup -- Nectar Straw -- Thin Teaspoon -- Thin Cup -- Thin Straw -- Puree -- Mechanical Soft -- Regular -- Multi-consistency -- Pill -- Cervical Esophageal Comment -- [place] Siren 09/29/2018, 4:47 PM Orbie Pyo Litaker M.Ed Actor Pager [phone] Office [phone]              US Abdomen Limited Ruq  Result Date: 09/25/2018 CLINICAL DATA:  Elevated LFTs EXAM: ULTRASOUND ABDOMEN LIMITED RIGHT UPPER QUADRANT COMPARISON:  None. FINDINGS: Gallbladder: Small amount of sludge. No cholelithiasis.  No pericholecystic fluid. Thickened gallbladder wall measuring up to 4.5 mm. Negative sonographic Murphy sign. Common bile duct: Diameter: 8 mm distally. No obstructing mass or choledocholithiasis. Liver: No focal lesion identified. Within normal limits in parenchymal echogenicity. Portal vein is patent on color Doppler imaging with normal direction of blood flow towards the liver. IMPRESSION: No  cholelithiasis. Mild gallbladder wall thickening which may be reactive secondary to hepatocellular disease given the elevated LFTs. No sonographic evidence of acute cholecystitis. Electronically Signed   By: Kathreen Devoid   On: 09/25/2018 16:37     LOS: 6 days   Oren Binet, MD  [facility]  If 7PM-7AM, please contact night-coverage  Please page via [email or web address]-click on MD name and type text message  10/01/2018, 2:06 PM

## 2018-10-01 NOTE — Progress Notes (Signed)
Pt. Is sleeping comfortably.  He is doing well out of the restraints.

## 2018-10-01 NOTE — Progress Notes (Signed)
Restraints removed on 10/01/2018.

## 2018-10-01 NOTE — Progress Notes (Signed)
Drained 950 ml from abdominal drain this morning.  Drainage is yellow, looks like pineapple juice.  Cleaned Tube site and changed dressing.

## 2018-10-02 LAB — GLUCOSE, CAPILLARY
GLUCOSE-CAPILLARY: 176 mg/dL — AB (ref 70–99)
GLUCOSE-CAPILLARY: 158 mg/dL — AB (ref 70–99)
Glucose-Capillary: 138 mg/dL — ABNORMAL HIGH (ref 70–99)
Glucose-Capillary: 127 mg/dL — ABNORMAL HIGH (ref 70–99)

## 2018-10-02 NOTE — Progress Notes (Addendum)
PROGRESS NOTE        PATIENT DETAILS Name: Matthew Schultz Age: 84 y.o. Sex: male Date of Birth: Mar 13, 1932 Admit Date: 09/25/2018 Admitting Physician Tanda Rockers, MD FUX:[id number], [name], MD  Brief Narrative: Patient is a 84 y.o. male with a history of BPH, CAD, DM, dyslipidemia presented with altered mental status, patient was found to have sepsis secondary to cholangitis and E. coli and enterococcal bacteremia.  Underwent ERCP and stone removal, given overall frailty/delirium/aspiration issues-decided against pursuing a cholecystectomy.  Plans are to continue with gentle medical treatment-awaiting SNF bed.  Subjective: Pleasantly confused-denies any abdominal pain.  Although confused follows some commands today.  Assessment/Plan: Sepsis secondary to acute cholangitis and polymicrobial (enterococcal, E. coli) bacteremia: Sepsis pathophysiology has resolved, underwent ERCP on 10/1.  Blood cultures positive for E. coli and enterococcus, evaluated by infectious disease with recommendations to continue with amoxicillin until 10/8.  Due to advanced age-overall frailty-aspiration pneumonia-and significant issues with delirium-not felt to be optimal candidate for cholecystectomy.  He has a sphincterotomy in place-and does not have documented gallstones on imaging.  This MD and general surgery discussed with family-they are aware of the difficult situation and agreeable to cautious observation without performing a cholecystectomy.   Dementia with delirium: Continues to have episodes of delirium during this hospital stay-continue low-dose risperidone.  Dysphagia: Evaluated by speech therapy-continue dysphagia 3 diet.  Family aware of aspiration risk and accepting.  Disseminated discussed with family at bedside on 10/3.  Acute kidney injury: Hemodynamically mediated due to sepsis-has resolved.  Hypokalemia: Repleted-recheck periodically  GERD: Continue PPI  BPH:  Continue Flomax  DM-2: CBGs stable with SSI.    Incidental right adrenal adenoma: Stable for outpatient follow-up  Deconditioning/debility: Worsened by acute illness-much weaker than usual baseline-probably will require SNF on discharge.  DVT Prophylaxis: Prophylactic Heparin   Code Status: Full code   Family Communication: None at bedside  Disposition Plan: Remain inpatient-SNF at discharge-  Antimicrobial agents: Anti-infectives (From admission, onward)   Start     Dose/Rate Route Frequency Ordered Stop   09/30/18 1015  amoxicillin (AMOXIL) capsule 500 mg     500 mg Oral Every 8 hours 09/30/18 1012 10/05/18 0559   09/29/18 1500  ampicillin (OMNIPEN) 2 g in sodium chloride 0.9 % 100 mL IVPB  Status:  Discontinued     2 g 300 mL/hr over 20 Minutes Intravenous Every 4 hours 09/29/18 1359 09/30/18 1012   09/28/18 1400  ceFAZolin (ANCEF) IVPB 1 g/50 mL premix  Status:  Discontinued     1 g 100 mL/hr over 30 Minutes Intravenous Every 8 hours 09/28/18 1212 09/29/18 1359   09/27/18 1700  cefTRIAXone (ROCEPHIN) 2 g in sodium chloride 0.9 % 100 mL IVPB  Status:  Discontinued     2 g 200 mL/hr over 30 Minutes Intravenous Every 24 hours 09/27/18 1625 09/28/18 1212   09/26/18 0600  piperacillin-tazobactam (ZOSYN) IVPB 3.375 g  Status:  Discontinued     3.375 g 12.5 mL/hr over 240 Minutes Intravenous Every 8 hours 09/26/18 0411 09/27/18 1625      Procedures: 10/1>> ERCP  CONSULTS:  GI and general surgery  Time spent: 25- minutes-Greater than 50% of this time was spent in counseling, explanation of diagnosis, planning of further management, and coordination of care.  MEDICATIONS: Scheduled Meds: . amoxicillin  500 mg Oral Q8H  .  aspirin EC  81 mg Oral Daily  . donepezil  5 mg Oral Daily  . gabapentin  300 mg Oral QHS  . heparin  5,000 Units Subcutaneous Q8H  . insulin aspart  0-5 Units Subcutaneous QHS  . insulin aspart  0-9 Units Subcutaneous TID WC  . pantoprazole  40  mg Oral Daily  . potassium chloride  40 mEq Oral BID  . risperiDONE  0.5 mg Oral QHS  . tamsulosin  0.4 mg Oral Daily   Continuous Infusions:  PRN Meds:.   PHYSICAL EXAM: Vital signs: Vitals:   10/01/18 0500 10/01/18 1452 10/01/18 2214 10/02/18 0552  BP:  124/70 (!) 145/75 113/68  Pulse:  70 66 60  Resp:  18 18 20   Temp:  98 F (36.7 C)  97.7 F (36.5 C)  TempSrc:  Axillary    SpO2:  95% 94% 98%  Weight: 68 kg     Height:       Filed Weights   09/28/18 0500 09/30/18 0500 10/01/18 0500  Weight: 74.2 kg 67.8 kg 68 kg   Body mass index is 24.2 kg/m.   General appearance:Awake, pleasantly confused-follow some commands. Eyes:no scleral icterus. HEENT: Atraumatic and Normocephalic Neck: supple, no JVD. Resp:Good air entry bilaterally,no rales or rhonchi CVS: S1 S2 regular, no murmurs.  GI: Bowel sounds present, Non tender and not distended with no gaurding, rigidity or rebound. Extremities: B/L Lower Ext shows no edema, both legs are warm to touch Neurology:  Non focal Musculoskeletal:No digital cyanosis Skin:No Rash, warm and dry Wounds:N/A  I have personally reviewed following labs and imaging studies  LABORATORY DATA: CBC: Recent Labs  Lab 09/25/18 1411 09/26/18 0350 09/28/18 0407 09/29/18 0750  WBC 20.3* 15.4* 8.5 5.2  NEUTROABS 18.8*  --  7.3  --   HGB 13.4 12.4* 12.0* 11.5*  HCT 38.1* 36.9* 35.3* 34.7*  MCV 89.1 91.1 90.1 90.4  PLT 178 165 140* 141*    Basic Metabolic Panel: Recent Labs  Lab 09/26/18 0356 09/28/18 0407 09/29/18 0444 09/30/18 0659 10/01/18 0401  NA 139 133* 137 139 136  K 3.5 3.3* 3.2* 3.1* 3.9  CL 110 107 109 103 107  CO2 20* 22 21* 24 21*  GLUCOSE 144* 205* 105* 124* 147*  BUN 26* 20 13 8 11   CREATININE 1.03 1.03 0.81 0.82 0.87  CALCIUM 8.0* 7.4* 7.7* 8.2* 8.1*  MG 2.2  --   --   --   --     GFR: Estimated Creatinine Clearance: 57 mL/min (by C-G formula based on SCr of 0.87 mg/dL).  Liver Function Tests: Recent  Labs  Lab 09/26/18 0356 09/28/18 0407 09/29/18 0444 09/30/18 0659 10/01/18 0401  AST 241* 33 29 36 30  ALT 252* 92* 67* 60* 50*  ALKPHOS 184* 150* 145* 188* 179*  BILITOT 5.8* 1.2 1.4* 1.7* 1.3*  PROT 5.9* 5.5* 5.2* 6.0* 5.5*  ALBUMIN 2.8* 2.4* 2.4* 2.8* 2.6*   No results for input(s): LIPASE, AMYLASE in the last 168 hours. No results for input(s): AMMONIA in the last 168 hours.  Coagulation Profile: No results for input(s): INR, PROTIME in the last 168 hours.  Cardiac Enzymes: Recent Labs  Lab 09/25/18 1411 09/29/18 0750  TROPONINI <0.03 <0.03    BNP (last 3 results) No results for input(s): PROBNP in the last 8760 hours.  HbA1C: No results for input(s): HGBA1C in the last 72 hours.  CBG: Recent Labs  Lab 10/01/18 0805 10/01/18 1300 10/01/18 1738 10/01/18 2215 10/02/18 0831  GLUCAP 149* 133* 105* 100* 138*    Lipid Profile: No results for input(s): CHOL, HDL, LDLCALC, TRIG, CHOLHDL, LDLDIRECT in the last 72 hours.  Thyroid Function Tests: No results for input(s): TSH, T4TOTAL, FREET4, T3FREE, THYROIDAB in the last 72 hours.  Anemia Panel: No results for input(s): VITAMINB12, FOLATE, FERRITIN, TIBC, IRON, RETICCTPCT in the last 72 hours.  Urine analysis:    Component Value Date/Time   COLORURINE AMBER (A) 09/25/2018 1440   APPEARANCEUR CLEAR (A) 09/25/2018 1440   APPEARANCEUR Clear 11/28/2013 1712   LABSPEC 1.019 09/25/2018 1440   LABSPEC 1.019 11/28/2013 1712   PHURINE 6.0 09/25/2018 1440   GLUCOSEU 50 (A) 09/25/2018 1440   GLUCOSEU Negative 11/28/2013 1712   HGBUR NEGATIVE 09/25/2018 1440   BILIRUBINUR SMALL (A) 09/25/2018 1440   BILIRUBINUR Negative 11/28/2013 1712   KETONESUR NEGATIVE 09/25/2018 1440   PROTEINUR 30 (A) 09/25/2018 1440   NITRITE NEGATIVE 09/25/2018 1440   LEUKOCYTESUR NEGATIVE 09/25/2018 1440   LEUKOCYTESUR Negative 11/28/2013 1712    Sepsis Labs: Lactic Acid, Venous    Component Value Date/Time   LATICACIDVEN 1.7  09/26/2018 0350    MICROBIOLOGY: Recent Results (from the past 240 hour(s))  Culture, blood (routine x 2)     Status: Abnormal   Collection Time: 09/25/18  2:41 PM  Result Value Ref Range Status   Specimen Description   Final    BLOOD RAC Performed at [facility], [address], [place], [place] [address]    Special Requests   Final    BOTTLES DRAWN AEROBIC AND ANAEROBIC Blood Culture adequate volume Performed at [facility], [address] [place], [place] [address]    Culture  Setup Time   Final    GRAM NEGATIVE RODS IN BOTH AEROBIC AND ANAEROBIC BOTTLES CRITICAL RESULT CALLED TO, READ BACK BY AND VERIFIED WITH: DAVID BESANTI AT 3790 09/26/18.PMH Performed at [facility], [place] [address] [place], [place] [address]    Culture ESCHERICHIA COLI (A)  Final   Report Status 09/29/2018 FINAL  Final   Organism ID, Bacteria ESCHERICHIA COLI  Final      Susceptibility   Escherichia coli - MIC*    AMPICILLIN <=2 SENSITIVE Sensitive     CEFAZOLIN <=4 SENSITIVE Sensitive     CEFEPIME <=1 SENSITIVE Sensitive     CEFTAZIDIME <=1 SENSITIVE Sensitive     CEFTRIAXONE <=1 SENSITIVE Sensitive     CIPROFLOXACIN <=0.25 SENSITIVE Sensitive     GENTAMICIN <=1 SENSITIVE Sensitive     IMIPENEM <=0.25 SENSITIVE Sensitive     TRIMETH/SULFA <=20 SENSITIVE Sensitive     AMPICILLIN/SULBACTAM <=2 SENSITIVE Sensitive     PIP/TAZO <=4 SENSITIVE Sensitive     Extended ESBL NEGATIVE Sensitive     * ESCHERICHIA COLI  Culture, blood (routine x 2)     Status: Abnormal   Collection Time: 09/25/18  2:41 PM  Result Value Ref Range Status   Specimen Description   Final    BLOOD L AC Performed at [facility], [address], [place], [place] [address]    Special Requests   Final    BOTTLES DRAWN AEROBIC AND ANAEROBIC Blood Culture adequate volume Performed at [facility], [address] [place], [place] [address]    Culture  Setup Time   Final    GRAM  NEGATIVE RODS IN BOTH AEROBIC AND ANAEROBIC BOTTLES CRITICAL RESULT CALLED TO, READ BACK BY AND VERIFIED WITH: DAVID BESANTI AT 8341 09/26/18.PMH Performed at [facility], [address], [place], [place] [address]    Culture (A)  Final    ESCHERICHIA COLI SUSCEPTIBILITIES PERFORMED ON PREVIOUS CULTURE WITHIN THE LAST 5 DAYS. ENTEROCOCCUS CASSELIFLAVUS CRITICAL RESULT CALLED TO, READ BACK BY AND VERIFIED WITH: [place] [id number] FCP Performed at [facility], [place] [address] [place], [place] [address]    Report Status 09/30/2018 FINAL  Final   Organism ID, Bacteria ENTEROCOCCUS CASSELIFLAVUS  Final      Susceptibility   Enterococcus casseliflavus - MIC*    AMPICILLIN <=2 SENSITIVE Sensitive     VANCOMYCIN RESISTANT Resistant     GENTAMICIN SYNERGY SENSITIVE Sensitive     LINEZOLID 2 SENSITIVE Sensitive     * ENTEROCOCCUS CASSELIFLAVUS  Blood Culture ID Panel (Reflexed)     Status: Abnormal   Collection Time: 09/25/18  2:41 PM  Result Value Ref Range Status   Enterococcus species NOT DETECTED NOT DETECTED Final   Listeria monocytogenes NOT DETECTED NOT DETECTED Final   Staphylococcus species NOT DETECTED NOT DETECTED Final   Staphylococcus aureus (BCID) NOT DETECTED NOT DETECTED Final   Streptococcus species NOT DETECTED NOT DETECTED Final   Streptococcus agalactiae NOT DETECTED NOT DETECTED Final   Streptococcus pneumoniae NOT DETECTED NOT DETECTED Final   Streptococcus pyogenes NOT DETECTED NOT DETECTED Final   Acinetobacter baumannii NOT DETECTED NOT DETECTED Final   Enterobacteriaceae species DETECTED (A) NOT DETECTED Final    Comment: Enterobacteriaceae represent a large family of gram-negative bacteria, not a single organism. CRITICAL RESULT CALLED TO, READ BACK BY AND VERIFIED WITH: DAVID BESANTI AT 5284 09/26/18.PMH    Enterobacter cloacae complex NOT DETECTED NOT DETECTED Final   Escherichia coli DETECTED (A) NOT DETECTED Final    Comment:  CRITICAL RESULT CALLED TO, READ BACK BY AND VERIFIED WITH: DAVID BESANTI AT 1324 09/26/18.PMH    Klebsiella oxytoca NOT DETECTED NOT DETECTED Final   Klebsiella pneumoniae NOT DETECTED NOT DETECTED Final   Proteus species NOT DETECTED NOT DETECTED Final   Serratia marcescens NOT DETECTED NOT DETECTED Final   Carbapenem resistance NOT DETECTED NOT DETECTED Final   Haemophilus influenzae NOT DETECTED NOT DETECTED Final   Neisseria meningitidis NOT DETECTED NOT DETECTED Final   Pseudomonas aeruginosa NOT DETECTED NOT DETECTED Final   Candida albicans NOT DETECTED NOT DETECTED Final   Candida glabrata NOT DETECTED NOT DETECTED Final   Candida krusei NOT DETECTED NOT DETECTED Final   Candida parapsilosis NOT DETECTED NOT DETECTED Final   Candida tropicalis NOT DETECTED NOT DETECTED Final    Comment: Performed at [facility], [address] [place], [place] [address]  MRSA PCR Screening     Status: None   Collection Time: 09/25/18 11:18 PM  Result Value Ref Range Status   MRSA by PCR NEGATIVE NEGATIVE Final    Comment:        The GeneXpert MRSA Assay (FDA approved for NASAL specimens only), is one component of a comprehensive MRSA colonization surveillance program. It is not intended to diagnose MRSA infection nor to guide or monitor treatment for MRSA infections. Performed at [facility], [place] [address], [place], [place] [address]   Culture, blood (Routine X 2) w Reflex to ID Panel     Status: None (Preliminary result)   Collection Time: 09/29/18  2:20 PM  Result Value Ref Range Status   Specimen Description BLOOD RIGHT ARM  Final   Special Requests   Final    BOTTLES DRAWN AEROBIC ONLY  Blood Culture adequate volume   Culture   Final    NO GROWTH 2 DAYS Performed at [facility], [place] [address] [place], [place] [address]    Report Status PENDING  Incomplete  Culture, blood (Routine X 2) w Reflex to ID Panel     Status: None (Preliminary result)    Collection Time: 09/29/18  2:24 PM  Result Value Ref Range Status   Specimen Description BLOOD LEFT ARM  Final   Special Requests   Final    BOTTLES DRAWN AEROBIC ONLY Blood Culture adequate volume   Culture   Final    NO GROWTH 2 DAYS Performed at [facility], [place] [address] [place], [place] [address]    Report Status PENDING  Incomplete    RADIOLOGY STUDIES/RESULTS: Ct Head Wo Contrast  Result Date: 09/25/2018 CLINICAL DATA:  Altered LOC EXAM: CT HEAD WITHOUT CONTRAST TECHNIQUE: Contiguous axial images were obtained from the base of the skull through the vertex without intravenous contrast. COMPARISON:  MRI brain 11/29/2013, CT brain 11/28/2013 FINDINGS: Brain: No acute territorial infarction, hemorrhage or intracranial mass is visualized. Moderate-to-marked atrophy. Moderate small vessel ischemic changes of the white matter. Stable ventricle size. Vascular: No hyperdense vessel.  Carotid vascular calcification Skull: No fracture. Stable heterogenous lucency in the right parietal bone. Sinuses/Orbits: No acute finding. Other: None IMPRESSION: 1. No CT evidence for acute intracranial abnormality. 2. Atrophy with small vessel ischemic changes of the white matter. Electronically Signed   By: Donavan Foil M.D.   On: 09/25/2018 15:00   Ct Abdomen Pelvis W Contrast  Result Date: 09/25/2018 CLINICAL DATA:  Right-sided abdominal pain. EXAM: CT ABDOMEN AND PELVIS WITH CONTRAST TECHNIQUE: Multidetector CT imaging of the abdomen and pelvis was performed using the standard protocol following bolus administration of intravenous contrast. CONTRAST:  162mL ISOVUE-300 IOPAMIDOL (ISOVUE-300) INJECTION 61% COMPARISON:  Ultrasound September 25, 2018 and CT scan June 26, 2014 FINDINGS: Lower chest: No acute abnormality. Hepatobiliary: Intra and extrahepatic biliary duct dilatation is identified. The common bile duct measures up to 2 cm. At least 2 filling defects are seen in the distal common bile duct  with the largest measuring 14 mm on coronal image 41 and a smaller more distal apparent defect measuring 9 mm on coronal image 43. There is distention of the gallbladder. No liver mass identified. Portal vein is patent. Pancreas: Unremarkable. No pancreatic ductal dilatation or surrounding inflammatory changes. Spleen: Normal in size without focal abnormality. Adrenals/Urinary Tract: The left adrenal gland is normal. A low-attenuation mass in the right adrenal gland was noted to be adenoma on previous imaging. This adenoma measures nearly 2 cm. There is a small cyst in the left kidney which is otherwise normal. The left ureter is normal. A focus of air in the bladder is likely from recent catheterization. The bladder is otherwise normal. The patient has a right pelvic kidney with at least 1 cysts and severe hydronephrosis, unchanged. The ureter is not dilated. There is mild excretion into the dilated collecting system on delayed images. Stomach/Bowel: The stomach and small bowel are normal. The colon and visualized portions of the appendix are normal. Vascular/Lymphatic: Atherosclerotic changes are seen in the nonaneurysmal aorta. No adenopathy. Reproductive: Prostate is unremarkable. Other: No abdominal wall hernia or abnormality. No abdominopelvic ascites. Musculoskeletal: Anterior wedging of L3 is new since 2016. No other acute bony abnormalities. IMPRESSION: 1. Two filling defects in the distal common bile duct measuring 14 and 9 mm are consistent with choledocholithiasis  resulting in intra and extrahepatic biliary duct dilatation and distention of the gallbladder. 2. 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 3. Anterior wedging of L3 is new since 2016. It is age indeterminate but I would favor the finding is nonacute. Recommend clinical correlation. 4. Right adrenal adenoma. 5. Atherosclerotic changes in the nonaneurysmal aorta. Electronically  Signed   By: Dorise Bullion III M.D   On: 09/25/2018 18:34   Dg Chest Portable 1 View  Result Date: 09/25/2018 CLINICAL DATA:  Cough and congestion. EXAM: PORTABLE CHEST 1 VIEW COMPARISON:  September 25, 2018 FINDINGS: Scoliotic curvature of the thoracic spine. Atelectasis in the left base. The heart, hila, mediastinum, lungs, and pleura are otherwise unchanged. IMPRESSION: No active disease. Electronically Signed   By: Dorise Bullion III M.D   On: 09/25/2018 18:52   Dg Chest Port 1 View  Result Date: 09/25/2018 CLINICAL DATA:  Altered mental status. EXAM: PORTABLE CHEST 1 VIEW COMPARISON:  Radiograph of October 19, 2016. FINDINGS: Stable cardiomegaly. Atherosclerosis of thoracic aorta is noted. Dextroscoliosis of thoracic spine is noted. No pneumothorax is noted. Lungs are clear. Bony thorax is unremarkable. IMPRESSION: No acute cardiopulmonary abnormality seen. Aortic Atherosclerosis (ICD10-I70.0). Electronically Signed   By: Marijo Conception, M.D.   On: 09/25/2018 14:50   Dg Ercp Biliary & Pancreatic Ducts  Result Date: 09/27/2018 CLINICAL DATA:  Bile duct stone removal EXAM: ERCP TECHNIQUE: Multiple spot images obtained with the fluoroscopic device and submitted for interpretation post-procedure. COMPARISON:  None. FINDINGS: 2 fluoroscopic spot images document endoscopic cannulation and opacification of the CBD with passage of a balloon tipped catheter. There is incomplete opacification of the intrahepatic biliary tree, which appears decompressed centrally. IMPRESSION: Endoscopic CBD cannulation and intervention. These images were submitted for radiologic interpretation only. Please see the procedural report for the amount of contrast and the fluoroscopy time utilized. Electronically Signed   By: Lucrezia Europe M.D.   On: 09/27/2018 09:25   Dg Swallowing Func-speech Pathology  Result Date: 09/29/2018 Objective Swallowing Evaluation: Type of Study: MBS-Modified Barium Swallow Study  Patient Details  Name: EERO DINI MRN: [id number] Date of Birth: [birth year] Today's Date: 09/29/2018 Time: SLP Start Time (ACUTE ONLY): 4315 -SLP Stop Time (ACUTE ONLY): 1520 SLP Time Calculation (min) (ACUTE ONLY): 16 min Past Medical History: Past Medical History: Diagnosis Date . Arthritis  . BPH (benign prostatic hyperplasia)  . CAD (coronary artery disease)  . DM (diabetes mellitus) ([facility])  . HLD (hyperlipidemia)  . HTN (hypertension)  . Hydronephrosis  . Incomplete bladder emptying  . OA (osteoarthritis)  . Renal mass  . Stroke [facility])  Past Surgical History: Past Surgical History: Procedure Laterality Date . BACK SURGERY   HPI: [name] with h/o BPH, CAD, DM, HLD, HTN, OA, Renal mass, and CVA, who presented to [facility] with altered mental status and sepsis.  CT confirmed choledocholithiasis with two obstructing stones and signs of cholangitis.  He was transferred to [facility] and underwent ERCP 10/1.  CXR 9/29 without active disease.  Subjective: pt alert but minimally conversant, not following commands well Assessment / Plan / Recommendation CHL IP CLINICAL IMPRESSIONS 09/29/2018 Clinical Impression Pt presented with moderate oropharyngeal dysphagia marked by pharyngeal weakness evidenced by reduced hyolaryngeal excursion and decreased coordination/timing of swallow mechanisms. Mild lingual/palatal residue and moderate vallecular residue noted across thin and nectar POs. Pt also penetrated thin bolus to the VF and aspirated without sensation, however volitional cough  was effective to clear. While chin tuck strategy was effective in regards to its facilitation of complete airway closure with thin, pt's ability to execute commands to use the strategy was inconsistent and would therefore not be a reliable compensatory strategy. Full airway protection was present with nectar, puree, or regular textures, although prolonged mastication and oral transit observed with puree and regular, and premature spill to the valleculae X1 noted  with nectar. Given current presentation and dentition, recommend D3 diet with nectar thick liquids, use slow/small bites and sips in a minimally distracting environment, and ST will continue to provide treatment with diet safety and efficiency including opportunities for advancement.  SLP Visit Diagnosis Dysphagia, oropharyngeal phase (R13.12) Attention and concentration deficit following -- Frontal lobe and executive function deficit following -- Impact on safety and function Moderate aspiration risk   CHL IP TREATMENT RECOMMENDATION 09/29/2018 Treatment Recommendations Therapy as outlined in treatment plan below   Prognosis 09/29/2018 Prognosis for Safe Diet Advancement Good Barriers to Reach Goals Cognitive deficits Barriers/Prognosis Comment -- CHL IP DIET RECOMMENDATION 09/29/2018 SLP Diet Recommendations Dysphagia 3 (Mech soft) solids;Nectar thick liquid Liquid Administration via Cup;No straw Medication Administration Whole meds with puree Compensations Minimize environmental distractions;Slow rate;Small sips/bites Postural Changes Seated upright at 90 degrees   CHL IP OTHER RECOMMENDATIONS 09/29/2018 Recommended Consults -- Oral Care Recommendations Oral care BID Other Recommendations Order thickener from pharmacy   CHL IP FOLLOW UP RECOMMENDATIONS 09/29/2018 Follow up Recommendations Skilled Nursing facility   [facility] IP FREQUENCY AND DURATION 09/29/2018 Speech Therapy Frequency (ACUTE ONLY) min 2x/week Treatment Duration 2 weeks      CHL IP ORAL PHASE 09/29/2018 Oral Phase Impaired Oral - Pudding Teaspoon -- Oral - Pudding Cup -- Oral - Honey Teaspoon -- Oral - Honey Cup -- Oral - Nectar Teaspoon -- Oral - Nectar Cup Lingual/palatal residue;Premature spillage Oral - Nectar Straw -- Oral - Thin Teaspoon -- Oral - Thin Cup Holding of bolus Oral - Thin Straw -- Oral - Puree Lingual pumping;Delayed oral transit Oral - Mech Soft -- Oral - Regular Delayed oral transit;Impaired mastication Oral - Multi-Consistency -- Oral -  Pill -- Oral Phase - Comment --  CHL IP PHARYNGEAL PHASE 09/29/2018 Pharyngeal Phase Impaired Pharyngeal- Pudding Teaspoon -- Pharyngeal -- Pharyngeal- Pudding Cup -- Pharyngeal -- Pharyngeal- Honey Teaspoon -- Pharyngeal -- Pharyngeal- Honey Cup -- Pharyngeal -- Pharyngeal- Nectar Teaspoon -- Pharyngeal -- Pharyngeal- Nectar Cup Pharyngeal residue - valleculae;Pharyngeal residue - pyriform Pharyngeal -- Pharyngeal- Nectar Straw -- Pharyngeal -- Pharyngeal- Thin Teaspoon -- Pharyngeal -- Pharyngeal- Thin Cup Penetration/Aspiration during swallow;Compensatory strategies attempted (with notebox);Other (Comment) Pharyngeal Material enters airway, CONTACTS cords and not ejected out;Material enters airway, passes BELOW cords then ejected out Pharyngeal- Thin Straw -- Pharyngeal -- Pharyngeal- Puree WFL Pharyngeal -- Pharyngeal- Mechanical Soft -- Pharyngeal -- Pharyngeal- Regular WFL Pharyngeal -- Pharyngeal- Multi-consistency -- Pharyngeal -- Pharyngeal- Pill -- Pharyngeal -- Pharyngeal Comment --  CHL IP CERVICAL ESOPHAGEAL PHASE 09/29/2018 Cervical Esophageal Phase WFL Pudding Teaspoon -- Pudding Cup -- Honey Teaspoon -- Honey Cup -- Nectar Teaspoon -- Nectar Cup -- Nectar Straw -- Thin Teaspoon -- Thin Cup -- Thin Straw -- Puree -- Mechanical Soft -- Regular -- Multi-consistency -- Pill -- Cervical Esophageal Comment -- [place] Siren 09/29/2018, 4:47 PM Orbie Pyo Litaker M.Ed Actor Pager [phone] Office [phone]              US Abdomen Limited Ruq  Result Date: 09/25/2018 CLINICAL DATA:  Elevated LFTs EXAM: ULTRASOUND ABDOMEN  LIMITED RIGHT UPPER QUADRANT COMPARISON:  None. FINDINGS: Gallbladder: Small amount of sludge. No cholelithiasis. No pericholecystic fluid. Thickened gallbladder wall measuring up to 4.5 mm. Negative sonographic Murphy sign. Common bile duct: Diameter: 8 mm distally. No obstructing mass or choledocholithiasis. Liver: No focal lesion identified. Within  normal limits in parenchymal echogenicity. Portal vein is patent on color Doppler imaging with normal direction of blood flow towards the liver. IMPRESSION: No cholelithiasis. Mild gallbladder wall thickening which may be reactive secondary to hepatocellular disease given the elevated LFTs. No sonographic evidence of acute cholecystitis. Electronically Signed   By: Kathreen Devoid   On: 09/25/2018 16:37     LOS: 7 days   Oren Binet, MD  [facility]  If 7PM-7AM, please contact night-coverage  Please page via [email or web address]-click on MD name and type text message  10/02/2018, 11:22 AM

## 2018-10-03 DIAGNOSIS — E440 Moderate protein-calorie malnutrition: Secondary | ICD-10-CM

## 2018-10-03 LAB — GLUCOSE, CAPILLARY
Glucose-Capillary: 138 mg/dL — ABNORMAL HIGH (ref 70–99)
Glucose-Capillary: 154 mg/dL — ABNORMAL HIGH (ref 70–99)
Glucose-Capillary: 164 mg/dL — ABNORMAL HIGH (ref 70–99)
Glucose-Capillary: 127 mg/dL — ABNORMAL HIGH (ref 70–99)

## 2018-10-03 MED ORDER — RISPERIDONE 0.5 MG PO TABS: 0.5000 mg | ORAL_TABLET | Freq: Every day | ORAL | Status: AC

## 2018-10-03 MED ORDER — AMOXICILLIN 500 MG PO CAPS: 500.0000 mg | ORAL_CAPSULE | Freq: Three times a day (TID) | ORAL | Status: DC

## 2018-10-03 MED ORDER — INSULIN ASPART 100 UNIT/ML ~~LOC~~ SOLN: SUBCUTANEOUS | 11 refills | Status: AC

## 2018-10-03 MED ORDER — HALOPERIDOL LACTATE 5 MG/ML IJ SOLN
2.0000 mg | Freq: Once | INTRAMUSCULAR | Status: AC
  Administered 2018-10-03: 2 mg via INTRAVENOUS
  Filled 2018-10-03: qty 1

## 2018-10-03 MED ORDER — TRAMADOL HCL 50 MG PO TABS: 50.0000 mg | ORAL_TABLET | Freq: Three times a day (TID) | ORAL | 0 refills | Status: AC | PRN

## 2018-10-03 NOTE — Progress Notes (Signed)
Nutrition Follow-up  DOCUMENTATION CODES:   Non-severe (moderate) malnutrition in context of chronic illness  INTERVENTION:   - Vital Cuisine Shake BID, each supplement provides 520 kcal and 22 grams of protein  NUTRITION DIAGNOSIS:   Moderate Malnutrition related to chronic illness (advanced dementia) as evidenced by mild fat depletion, mild muscle depletion, moderate muscle depletion.  Ongoing  GOAL:   Patient will meet greater than or equal to 90% of their needs  Progressing  MONITOR:   Diet advancement, Labs, I & O's, Weight trends  REASON FOR ASSESSMENT:   Malnutrition Screening Tool    ASSESSMENT:   Patient with PMH significant for CAD, DM, HLD, HTN, CVA, dementia, and renal mass. Presented to [facility] with AMS from home. Admitted for sepsis secondary to possible cholangitis.  9/29 - CTA/P confirms choledocholithiasis with two obstructing stones creating biliary duct dilatation and distension of gallbladder 10/1 - s/p ERCP with stone removal 10/3 - s/p MBS with recommendations for Dysphagia 3 diet with nectar-thick liquids  Pt awaiting SNF bed at this time.  Spoke with RN at bedside. Per RN, pt ate well at breakfast this morning. Noted unopened Vital Cuisine shake at bedside.  Noted pt likely d/c today to SNF.  Meal Completion: 30-75%  Medications reviewed and include: sliding scale Novolog, Protonix 40 mg daily, K-dur 40 mEq BID  Labs reviewed. CBG's: 138, 127, 158, 176 x 24 hours  Diet Order:   Diet Order            Diet - low sodium heart healthy        Diet Carb Modified        DIET DYS 3 Room service appropriate? Yes; Fluid consistency: Nectar Thick  Diet effective now              EDUCATION NEEDS:   Not appropriate for education at this time  Skin:  Skin Assessment: Skin Integrity Issues: Other: several abrasions to extremeties, R arm skin tear  Last BM:  10/7  Height:   Ht Readings from Last 1 Encounters:  09/27/18 5\' 6"  (1.676  m)    Weight:   Wt Readings from Last 1 Encounters:  10/01/18 68 kg    Ideal Body Weight:  59.1 kg  BMI:  Body mass index is 24.2 kg/m.  Estimated Nutritional Needs:   Kcal:  2000-2200 kcal  Protein:  100-120 grams  Fluid:  >/= 2 L/day    Gaynell Face, MS, RD, LDN Inpatient Clinical Dietitian Pager: [phone] Weekend/After Hours: [phone]

## 2018-10-03 NOTE — Progress Notes (Addendum)
Occupational Therapy Treatment Patient Details Name: Matthew Schultz MRN: [id number] DOB: 31-Jul-1932 Today's Date: 10/03/2018    History of present illness 84 yo male with BPH, CAD, DM, HTN, arthritis, renal mass, CVA presented to [facility] with altered mental status and sepsis.  CT confirmed choledocholithiasis with two obstructing stones and signs of cholangitis.  He was transferred to [facility] and underwent ERCP 10/1. Awaiting sx - probably 10/3   OT comments  Pt seen for ADL and mobility progression. Pt currently requiring +2 assist for transfers OOB; completing simple grooming ADL tasks in supported sitting with overall minA including cues for initiation and properly sequencing through tasks. Pt continues to require increased time and multimodal cues for following simple commands. Currently requiring max-totalA for LB and toileting ADLs. Feel POC remains appropriate at this time. Will continue to follow acutely to progress pt towards established OT goals.   Follow Up Recommendations  SNF;Supervision/Assistance - 24 hour    Equipment Recommendations  Other (comment)(defer to next venue)          Precautions / Restrictions Precautions Precautions: Fall Restrictions Weight Bearing Restrictions: No       Mobility Bed Mobility Overal bed mobility: Needs Assistance Bed Mobility: Rolling;Supine to Sit;Sit to Supine Rolling: Mod assist   Supine to sit: Mod assist;+2 for safety/equipment Sit to supine: Mod assist;+2 for safety/equipment   General bed mobility comments: Assist to elevate trunk to upright and rotate to EOB, increased assist to return to supine and reposition in bed  Transfers Overall transfer level: Needs assistance Equipment used: 2 person hand held assist;Rolling walker (2 wheeled) Transfers: Sit to/from [facility] Sit to Stand: Mod assist;+2 physical assistance Stand pivot transfers: Mod assist;+2 physical assistance;+2 safety/equipment        General transfer comment: Performed transfer to chair and then transfer for ambulation with RW    Balance Overall balance assessment: Needs assistance Sitting-balance support: Bilateral upper extremity supported;Feet supported Sitting balance-Leahy Scale: Fair Sitting balance - Comments: requires assist initially, progressed to min guard for safety   Standing balance support: Bilateral upper extremity supported Standing balance-Leahy Scale: Poor Standing balance comment: dependent on UE support from therapy team and RW                           ADL either performed or assessed with clinical judgement   ADL Overall ADL's : Needs assistance/impaired     Grooming: Wash/dry face;Brushing hair;Minimal assistance;Sitting Grooming Details (indicate cue type and reason): setup assist and cues to initiate/sequence through tasks; once initiated pt able to perform                      Toileting- Clothing Manipulation and Hygiene: Total assistance Toileting - Clothing Manipulation Details (indicate cue type and reason): pt with incontinent BM start of session requiring totalA+2 for peri-care at bed level     Functional mobility during ADLs: Moderate assistance;+2 for physical assistance;+2 for safety/equipment                         Cognition Arousal/Alertness: Awake/alert Behavior During Therapy: Flat affect Overall Cognitive Status: No family/caregiver present to determine baseline cognitive functioning Area of Impairment: Orientation;Attention;Memory;Following commands;Safety/judgement;Awareness;Problem solving                 Orientation Level: Disoriented to;Place;Time;Situation Current Attention Level: Sustained Memory: Decreased recall of precautions;Decreased short-term memory Following Commands: Follows one step commands  with increased time;Follows one step commands inconsistently Safety/Judgement: Decreased awareness of safety;Decreased  awareness of deficits Awareness: Intellectual Problem Solving: Decreased initiation;Requires verbal cues;Requires tactile cues General Comments: patient more flat affect and less interactive this session; pt following approx 50% of one step commands during grooming tasks given increased time and cues; pt opening soap bottle when instructed to do so, when cued to close bottle after completion of task pt attempting to bring bottle to mouth to drink instead and requires assist not to do so                    General Comments hygiene and pericare performed during session    Pertinent Vitals/ Pain       Pain Assessment: Faces Faces Pain Scale: Hurts little more Pain Location: generalized Pain Descriptors / Indicators: Grimacing Pain Intervention(s): Monitored during session;Repositioned                Frequency  Min 2X/week        Progress Toward Goals  OT Goals(current goals can now be found in the care plan section)  Progress towards OT goals: Progressing toward goals  Acute Rehab OT Goals Patient Stated Goal: none stated ADL Goals Pt Will Perform Grooming: with supervision;sitting Pt Will Transfer to Toilet: with mod assist;stand pivot transfer;bedside commode Pt Will Perform Toileting - Clothing Manipulation and hygiene: with mod assist;sit to/from stand Additional ADL Goal #1: Pt will follow one step commands for ADLs at 75% success rate  Plan Discharge plan remains appropriate    Co-evaluation    PT/OT/SLP Co-Evaluation/Treatment: Yes Reason for Co-Treatment: Complexity of the patient's impairments (multi-system involvement) PT goals addressed during session: Mobility/safety with mobility OT goals addressed during session: ADL's and self-care      AM-PAC PT "6 Clicks" Daily Activity     Outcome Measure   Help from another person eating meals?: A Lot Help from another person taking care of personal grooming?: A Lot Help from another person toileting,  which includes using toliet, bedpan, or urinal?: A Lot Help from another person bathing (including washing, rinsing, drying)?: A Lot Help from another person to put on and taking off regular upper body clothing?: A Lot Help from another person to put on and taking off regular lower body clothing?: A Lot 6 Click Score: 12    End of Session Equipment Utilized During Treatment: Rolling walker  OT Visit Diagnosis: Unsteadiness on feet (R26.81);Repeated falls (R29.6);Other abnormalities of gait and mobility (R26.89);Muscle weakness (generalized) (M62.81);Other symptoms and signs involving cognitive function   Activity Tolerance Patient tolerated treatment well   Patient Left in bed;with call bell/phone within reach;with bed alarm set;with nursing/sitter in room(nursing student present; telesitter present)   Nurse Communication Mobility status        Time: 5009-3818 OT Time Calculation (min): 24 min  Charges: OT General Charges $OT Visit: 1 Visit OT Treatments $Self Care/Home Management : 8-22 mins  Lou Cal, [place] Wheeler Pager [phone] Office [place] 10/03/2018, 9:39 AM

## 2018-10-03 NOTE — Clinical Social Work Note (Signed)
CSW talked with patient's daughter, Audrick Lamoureaux [phone]) and confirmed facility choice of [facility]. CSW talked with Magda Paganini, admissions director with facility and she initiated authorization for patient. Admissions director also provided with daughter's name and phone number. CSW talked with patient's nurse later today and given update.  Kemuel Buchmann Givens, MSW, LCSW Licensed Clinical Social Worker [place] [phone]

## 2018-10-03 NOTE — Discharge Summary (Addendum)
PATIENT DETAILS Name: Matthew Schultz Age: 84 y.o. Sex: male Date of Birth: 08-19-1932 MRN: [id number]. Admitting Physician: Tanda Rockers, MD JIR:[id number], [name], MD  Admit Date: 09/25/2018 Discharge date: 10/05/2018  Recommendations for Outpatient Follow-up:  1. Follow up with PCP in 1-2 weeks 2. Please obtain BMP/CBC in one week 3. Last day of amoxicillin on 10/8 4. Ensure speech therapy follow-up at SNF 5. Ensure palliative care follow-up at SNF  6. Incidental finding of right adrenal adenoma-stable for outpatient follow-up at the discretion of PCP.  Admitted From:  Home  Disposition: SNF   Home Health: Yes  Equipment/Devices: None  Discharge Condition: Stable  CODE STATUS: FULL CODE  Diet recommendation:  Heart Healthy / Carb Modified Diet recommendations: Dysphagia 3 (mechanical soft);Nectar-thick liquid Liquids provided via: Cup;No straw Medication Administration: Whole meds with puree Supervision: Patient able to self feed;Intermittent supervision to cue for compensatory strategies Compensations: Minimize environmental distractions;Slow rate;Small sips/bites Postural Changes and/or Swallow Maneuvers: Seated upright 90 degrees  Brief Summary: See H&P, Labs, Consult and Test reports for all details in brief, Patient is a 84 y.o. male with a history of BPH, CAD, DM, dyslipidemia presented with altered mental status, patient was found to have sepsis secondary to cholangitis and E. coli and enterococcal bacteremia.  Underwent ERCP and stone removal, given overall frailty/delirium/aspiration issues-decided against pursuing a cholecystectomy.  Plans are to continue with gentle medical treatment-plans are for SNF on discharge.  Brief Hospital Course: Sepsis secondary to acute cholangitis and polymicrobial (enterococcal, E. coli) bacteremia: Sepsis pathophysiology has resolved, underwent ERCP on 10/1.  Blood cultures positive for E. coli and enterococcus, evaluated  by infectious disease with recommendations to continue with amoxicillin until 10/8.  Due to advanced age-overall frailty-aspiration pneumonia-and significant issues with delirium-not felt to be optimal candidate for cholecystectomy.  He has a sphincterotomy in place-and does not have documented gallstones on imaging.  This MD and general surgery discussed with family-they are aware of the difficult situation and agreeable to cautious observation without performing a cholecystectomy.   Dementia with delirium: Continues to have episodes of mild delirium during this hospital stay-continue low-dose risperidone.  Dysphagia: Evaluated by speech therapy-continue dysphagia 3 diet.  Family aware of aspiration risk and accepting.  This MD discussed with family at bedside on 10/3.Ensure speech thearpy follow up at SNF  Acute kidney injury: Hemodynamically mediated due to sepsis-has resolved.  Hypokalemia: Repleted-recheck periodically at SNF  GERD: Continue PPI  BPH: Continue Flomax  DM-2: CBGs stable with SSI.    Incidental right adrenal adenoma: Stable for outpatient follow-up  Deconditioning/debility: Worsened by acute illness-much weaker than usual baseline-probably will require SNF on discharge.  Palliative Care: Frail-demented-family advised for DNR status-but at this time patient continues to be a full code.  Will require palliative care follow-up at SNF.  Procedures/Studies: None  Discharge Diagnoses:  Active Problems:   Choledocholithiasis   Cholangiolitis   Acute cholangitis   Sepsis due to Escherichia coli (HCC)   E coli bacteremia   Malnutrition of moderate degree   Enterococcal bacteremia   Discharge Instructions:  Activity:  As tolerated with Full fall precautions use walker/cane & assistance as needed   Discharge Instructions    Call MD for:  persistant nausea and vomiting   Complete by:  As directed    Call MD for:  severe uncontrolled pain   Complete by:   As directed    Diet - low sodium heart healthy   Complete by:  As directed  Diet recommendations: Dysphagia 3 (mechanical soft);Nectar-thick liquid Liquids provided via: Cup;No straw Medication Administration: Whole meds with puree Supervision: Patient able to self feed;Intermittent supervision to cue for compensatory strategies Compensations: Minimize environmental distractions;Slow rate;Small sips/bites Postural Changes and/or Swallow Maneuvers: Seated upright 90 degrees   Diet Carb Modified   Complete by:  As directed    Increase activity slowly   Complete by:  As directed      Allergies as of 10/05/2018   No Known Allergies     Medication List    STOP taking these medications   metoprolol tartrate 25 MG tablet Commonly known as:  LOPRESSOR     TAKE these medications   amoxicillin 500 MG capsule Commonly known as:  AMOXIL Take 1 capsule (500 mg total) by mouth every 8 (eight) hours. End date of 10/8   aspirin EC 81 MG tablet Take 81 mg by mouth daily.   donepezil 5 MG tablet Commonly known as:  ARICEPT Take 1 tablet by mouth daily.   feeding supplement (ENSURE ENLIVE) Liqd Take 237 mLs by mouth 3 (three) times daily between meals.   gabapentin 300 MG capsule Commonly known as:  NEURONTIN Take 300 mg by mouth at bedtime.   insulin aspart 100 UNIT/ML injection Commonly known as:  novoLOG 0-9 Units, Subcutaneous, 3 times daily with meals CBG < 70: implement hypoglycemia protocol CBG 70 - 120: 0 units CBG 121 - 150: 1 unit CBG 151 - 200: 2 units CBG 201 - 250: 3 units CBG 251 - 300: 5 units CBG 301 - 350: 7 units CBG 351 - 400: 9 units CBG > 400: call MD   omeprazole 20 MG capsule Commonly known as:  PRILOSEC Take 20 mg by mouth 2 (two) times daily.   risperiDONE 0.5 MG tablet Commonly known as:  RISPERDAL Take 1 tablet (0.5 mg total) by mouth at bedtime.   tamsulosin 0.4 MG Caps capsule Commonly known as:  FLOMAX Take 1 capsule (0.4 mg total) by  mouth daily.   traMADol 50 MG tablet Commonly known as:  ULTRAM Take 1 tablet (50 mg total) by mouth every 8 (eight) hours as needed. What changed:  reasons to take this       Contact information for follow-up providers    Maryland Pink, MD. Schedule an appointment as soon as possible for a visit in 1 week(s).   Specialty:  Family Medicine Contact information: [address] [place] [address] [phone]            Contact information for after-discharge care    [facility] .   Service:  Skilled Nursing Contact information: [place] [phone]                 No Known Allergies  Consultations:   ID and general surgery  Other Procedures/Studies: Ct Head Wo Contrast  Result Date: 09/25/2018 CLINICAL DATA:  Altered LOC EXAM: CT HEAD WITHOUT CONTRAST TECHNIQUE: Contiguous axial images were obtained from the base of the skull through the vertex without intravenous contrast. COMPARISON:  MRI brain 11/29/2013, CT brain 11/28/2013 FINDINGS: Brain: No acute territorial infarction, hemorrhage or intracranial mass is visualized. Moderate-to-marked atrophy. Moderate small vessel ischemic changes of the white matter. Stable ventricle size. Vascular: No hyperdense vessel.  Carotid vascular calcification Skull: No fracture. Stable heterogenous lucency in the right parietal bone. Sinuses/Orbits: No acute finding. Other: None IMPRESSION: 1. No CT  evidence for acute intracranial abnormality. 2. Atrophy with small vessel ischemic changes of the white matter. Electronically Signed   By: Donavan Foil M.D.   On: 09/25/2018 15:00   Ct Abdomen Pelvis W Contrast  Result Date: 09/25/2018 CLINICAL DATA:  Right-sided abdominal pain. EXAM: CT ABDOMEN AND PELVIS WITH CONTRAST TECHNIQUE: Multidetector CT imaging of the abdomen and pelvis was performed using the standard protocol  following bolus administration of intravenous contrast. CONTRAST:  19mL ISOVUE-300 IOPAMIDOL (ISOVUE-300) INJECTION 61% COMPARISON:  Ultrasound September 25, 2018 and CT scan June 26, 2014 FINDINGS: Lower chest: No acute abnormality. Hepatobiliary: Intra and extrahepatic biliary duct dilatation is identified. The common bile duct measures up to 2 cm. At least 2 filling defects are seen in the distal common bile duct with the largest measuring 14 mm on coronal image 41 and a smaller more distal apparent defect measuring 9 mm on coronal image 43. There is distention of the gallbladder. No liver mass identified. Portal vein is patent. Pancreas: Unremarkable. No pancreatic ductal dilatation or surrounding inflammatory changes. Spleen: Normal in size without focal abnormality. Adrenals/Urinary Tract: The left adrenal gland is normal. A low-attenuation mass in the right adrenal gland was noted to be adenoma on previous imaging. This adenoma measures nearly 2 cm. There is a small cyst in the left kidney which is otherwise normal. The left ureter is normal. A focus of air in the bladder is likely from recent catheterization. The bladder is otherwise normal. The patient has a right pelvic kidney with at least 1 cysts and severe hydronephrosis, unchanged. The ureter is not dilated. There is mild excretion into the dilated collecting system on delayed images. Stomach/Bowel: The stomach and small bowel are normal. The colon and visualized portions of the appendix are normal. Vascular/Lymphatic: Atherosclerotic changes are seen in the nonaneurysmal aorta. No adenopathy. Reproductive: Prostate is unremarkable. Other: No abdominal wall hernia or abnormality. No abdominopelvic ascites. Musculoskeletal: Anterior wedging of L3 is new since 2016. No other acute bony abnormalities. IMPRESSION: 1. Two filling defects in the distal common bile duct measuring 14 and 9 mm are consistent with choledocholithiasis resulting in intra and  extrahepatic biliary duct dilatation and distention of the gallbladder. 2. 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 3. Anterior wedging of L3 is new since 2016. It is age indeterminate but I would favor the finding is nonacute. Recommend clinical correlation. 4. Right adrenal adenoma. 5. Atherosclerotic changes in the nonaneurysmal aorta. Electronically Signed   By: Dorise Bullion III M.D   On: 09/25/2018 18:34   Dg Chest Portable 1 View  Result Date: 09/25/2018 CLINICAL DATA:  Cough and congestion. EXAM: PORTABLE CHEST 1 VIEW COMPARISON:  September 25, 2018 FINDINGS: Scoliotic curvature of the thoracic spine. Atelectasis in the left base. The heart, hila, mediastinum, lungs, and pleura are otherwise unchanged. IMPRESSION: No active disease. Electronically Signed   By: Dorise Bullion III M.D   On: 09/25/2018 18:52   Dg Chest Port 1 View  Result Date: 09/25/2018 CLINICAL DATA:  Altered mental status. EXAM: PORTABLE CHEST 1 VIEW COMPARISON:  Radiograph of October 19, 2016. FINDINGS: Stable cardiomegaly. Atherosclerosis of thoracic aorta is noted. Dextroscoliosis of thoracic spine is noted. No pneumothorax is noted. Lungs are clear. Bony thorax is unremarkable. IMPRESSION: No acute cardiopulmonary abnormality seen. Aortic Atherosclerosis (ICD10-I70.0). Electronically Signed   By: Marijo Conception, M.D.   On: 09/25/2018 14:50   Dg Ercp Biliary & Pancreatic Ducts  Result Date: 09/27/2018 CLINICAL DATA:  Bile duct stone removal EXAM: ERCP TECHNIQUE: Multiple spot images obtained with the fluoroscopic device and submitted for interpretation post-procedure. COMPARISON:  None. FINDINGS: 2 fluoroscopic spot images document endoscopic cannulation and opacification of the CBD with passage of a balloon tipped catheter. There is incomplete opacification of the intrahepatic biliary tree, which appears decompressed centrally. IMPRESSION: Endoscopic CBD  cannulation and intervention. These images were submitted for radiologic interpretation only. Please see the procedural report for the amount of contrast and the fluoroscopy time utilized. Electronically Signed   By: Lucrezia Europe M.D.   On: 09/27/2018 09:25   Dg Swallowing Func-speech Pathology  Result Date: 09/29/2018 Objective Swallowing Evaluation: Type of Study: MBS-Modified Barium Swallow Study  Patient Details Name: Matthew Schultz MRN: [id number] Date of Birth: 1932/09/13 Today's Date: 09/29/2018 Time: SLP Start Time (ACUTE ONLY): 5621 -SLP Stop Time (ACUTE ONLY): 1520 SLP Time Calculation (min) (ACUTE ONLY): 16 min Past Medical History: Past Medical History: Diagnosis Date . Arthritis  . BPH (benign prostatic hyperplasia)  . CAD (coronary artery disease)  . DM (diabetes mellitus) ([place])  . HLD (hyperlipidemia)  . HTN (hypertension)  . Hydronephrosis  . Incomplete bladder emptying  . OA (osteoarthritis)  . Renal mass  . Stroke [facility])  Past Surgical History: Past Surgical History: Procedure Laterality Date . BACK SURGERY   HPI: [name] with h/o BPH, CAD, DM, HLD, HTN, OA, Renal mass, and CVA, who presented to [facility] with altered mental status and sepsis.  CT confirmed choledocholithiasis with two obstructing stones and signs of cholangitis.  He was transferred to [facility] and underwent ERCP 10/1.  CXR 9/29 without active disease.  Subjective: pt alert but minimally conversant, not following commands well Assessment / Plan / Recommendation CHL IP CLINICAL IMPRESSIONS 09/29/2018 Clinical Impression Pt presented with moderate oropharyngeal dysphagia marked by pharyngeal weakness evidenced by reduced hyolaryngeal excursion and decreased coordination/timing of swallow mechanisms. Mild lingual/palatal residue and moderate vallecular residue noted across thin and nectar POs. Pt also penetrated thin bolus to the VF and aspirated without sensation, however volitional cough was effective to clear. While chin tuck  strategy was effective in regards to its facilitation of complete airway closure with thin, pt's ability to execute commands to use the strategy was inconsistent and would therefore not be a reliable compensatory strategy. Full airway protection was present with nectar, puree, or regular textures, although prolonged mastication and oral transit observed with puree and regular, and premature spill to the valleculae X1 noted with nectar. Given current presentation and dentition, recommend D3 diet with nectar thick liquids, use slow/small bites and sips in a minimally distracting environment, and ST will continue to provide treatment with diet safety and efficiency including opportunities for advancement.  SLP Visit Diagnosis Dysphagia, oropharyngeal phase (R13.12) Attention and concentration deficit following -- Frontal lobe and executive function deficit following -- Impact on safety and function Moderate aspiration risk   CHL IP TREATMENT RECOMMENDATION 09/29/2018 Treatment Recommendations Therapy as outlined in treatment plan below   Prognosis 09/29/2018 Prognosis for Safe Diet Advancement Good Barriers to Reach Goals Cognitive deficits Barriers/Prognosis Comment -- CHL IP DIET RECOMMENDATION 09/29/2018 SLP Diet Recommendations Dysphagia 3 (Mech soft) solids;Nectar thick liquid Liquid Administration via Cup;No straw Medication Administration Whole meds with puree Compensations Minimize environmental distractions;Slow rate;Small sips/bites Postural Changes Seated upright at 90 degrees   CHL IP OTHER RECOMMENDATIONS 09/29/2018 Recommended Consults -- Oral Care Recommendations Oral care BID Other Recommendations Order thickener from  pharmacy   CHL IP FOLLOW UP RECOMMENDATIONS 09/29/2018 Follow up Recommendations Skilled Nursing facility   [facility] IP FREQUENCY AND DURATION 09/29/2018 Speech Therapy Frequency (ACUTE ONLY) min 2x/week Treatment Duration 2 weeks      CHL IP ORAL PHASE 09/29/2018 Oral Phase Impaired Oral - Pudding  Teaspoon -- Oral - Pudding Cup -- Oral - Honey Teaspoon -- Oral - Honey Cup -- Oral - Nectar Teaspoon -- Oral - Nectar Cup Lingual/palatal residue;Premature spillage Oral - Nectar Straw -- Oral - Thin Teaspoon -- Oral - Thin Cup Holding of bolus Oral - Thin Straw -- Oral - Puree Lingual pumping;Delayed oral transit Oral - Mech Soft -- Oral - Regular Delayed oral transit;Impaired mastication Oral - Multi-Consistency -- Oral - Pill -- Oral Phase - Comment --  CHL IP PHARYNGEAL PHASE 09/29/2018 Pharyngeal Phase Impaired Pharyngeal- Pudding Teaspoon -- Pharyngeal -- Pharyngeal- Pudding Cup -- Pharyngeal -- Pharyngeal- Honey Teaspoon -- Pharyngeal -- Pharyngeal- Honey Cup -- Pharyngeal -- Pharyngeal- Nectar Teaspoon -- Pharyngeal -- Pharyngeal- Nectar Cup Pharyngeal residue - valleculae;Pharyngeal residue - pyriform Pharyngeal -- Pharyngeal- Nectar Straw -- Pharyngeal -- Pharyngeal- Thin Teaspoon -- Pharyngeal -- Pharyngeal- Thin Cup Penetration/Aspiration during swallow;Compensatory strategies attempted (with notebox);Other (Comment) Pharyngeal Material enters airway, CONTACTS cords and not ejected out;Material enters airway, passes BELOW cords then ejected out Pharyngeal- Thin Straw -- Pharyngeal -- Pharyngeal- Puree WFL Pharyngeal -- Pharyngeal- Mechanical Soft -- Pharyngeal -- Pharyngeal- Regular WFL Pharyngeal -- Pharyngeal- Multi-consistency -- Pharyngeal -- Pharyngeal- Pill -- Pharyngeal -- Pharyngeal Comment --  CHL IP CERVICAL ESOPHAGEAL PHASE 09/29/2018 Cervical Esophageal Phase WFL Pudding Teaspoon -- Pudding Cup -- Honey Teaspoon -- Honey Cup -- Nectar Teaspoon -- Nectar Cup -- Nectar Straw -- Thin Teaspoon -- Thin Cup -- Thin Straw -- Puree -- Mechanical Soft -- Regular -- Multi-consistency -- Pill -- Cervical Esophageal Comment -- [place] Siren 09/29/2018, 4:47 PM Orbie Pyo Litaker M.Ed Actor Pager [phone] Office [phone]              US Abdomen Limited Ruq  Result  Date: 09/25/2018 CLINICAL DATA:  Elevated LFTs EXAM: ULTRASOUND ABDOMEN LIMITED RIGHT UPPER QUADRANT COMPARISON:  None. FINDINGS: Gallbladder: Small amount of sludge. No cholelithiasis. No pericholecystic fluid. Thickened gallbladder wall measuring up to 4.5 mm. Negative sonographic Murphy sign. Common bile duct: Diameter: 8 mm distally. No obstructing mass or choledocholithiasis. Liver: No focal lesion identified. Within normal limits in parenchymal echogenicity. Portal vein is patent on color Doppler imaging with normal direction of blood flow towards the liver. IMPRESSION: No cholelithiasis. Mild gallbladder wall thickening which may be reactive secondary to hepatocellular disease given the elevated LFTs. No sonographic evidence of acute cholecystitis. Electronically Signed   By: Kathreen Devoid   On: 09/25/2018 16:37     TODAY-DAY OF DISCHARGE:  Subjective:   [name] today remains pleasantly confused.  Objective:   Blood pressure 103/67, pulse 70, temperature 98.8 F (37.1 C), resp. rate 19, height 5\' 6"  (1.676 m), weight 67.6 kg, SpO2 93 %.  Intake/Output Summary (Last 24 hours) at 10/05/2018 1439 Last data filed at 10/05/2018 0939 Gross per 24 hour  Intake 100 ml  Output 275 ml  Net -175 ml   Filed Weights   09/30/18 0500 10/01/18 0500 10/05/18 0559  Weight: 67.8 kg 68 kg 67.6 kg    Exam: Awake Alert, Oriented *3, No new F.N deficits, Normal affect [place].AT,PERRAL Supple Neck,No JVD, No cervical lymphadenopathy appriciated.  Symmetrical Chest wall movement, Good air movement bilaterally, CTAB  RRR,No Gallops,Rubs or new Murmurs, No Parasternal Heave +ve B.Sounds, Abd Soft, Non tender, No organomegaly appriciated, No rebound -guarding or rigidity. No Cyanosis, Clubbing or edema, No new Rash or bruise   PERTINENT RADIOLOGIC STUDIES: Ct Head Wo Contrast  Result Date: 09/25/2018 CLINICAL DATA:  Altered LOC EXAM: CT HEAD WITHOUT CONTRAST TECHNIQUE: Contiguous axial images were  obtained from the base of the skull through the vertex without intravenous contrast. COMPARISON:  MRI brain 11/29/2013, CT brain 11/28/2013 FINDINGS: Brain: No acute territorial infarction, hemorrhage or intracranial mass is visualized. Moderate-to-marked atrophy. Moderate small vessel ischemic changes of the white matter. Stable ventricle size. Vascular: No hyperdense vessel.  Carotid vascular calcification Skull: No fracture. Stable heterogenous lucency in the right parietal bone. Sinuses/Orbits: No acute finding. Other: None IMPRESSION: 1. No CT evidence for acute intracranial abnormality. 2. Atrophy with small vessel ischemic changes of the white matter. Electronically Signed   By: Donavan Foil M.D.   On: 09/25/2018 15:00   Ct Abdomen Pelvis W Contrast  Result Date: 09/25/2018 CLINICAL DATA:  Right-sided abdominal pain. EXAM: CT ABDOMEN AND PELVIS WITH CONTRAST TECHNIQUE: Multidetector CT imaging of the abdomen and pelvis was performed using the standard protocol following bolus administration of intravenous contrast. CONTRAST:  157mL ISOVUE-300 IOPAMIDOL (ISOVUE-300) INJECTION 61% COMPARISON:  Ultrasound September 25, 2018 and CT scan June 26, 2014 FINDINGS: Lower chest: No acute abnormality. Hepatobiliary: Intra and extrahepatic biliary duct dilatation is identified. The common bile duct measures up to 2 cm. At least 2 filling defects are seen in the distal common bile duct with the largest measuring 14 mm on coronal image 41 and a smaller more distal apparent defect measuring 9 mm on coronal image 43. There is distention of the gallbladder. No liver mass identified. Portal vein is patent. Pancreas: Unremarkable. No pancreatic ductal dilatation or surrounding inflammatory changes. Spleen: Normal in size without focal abnormality. Adrenals/Urinary Tract: The left adrenal gland is normal. A low-attenuation mass in the right adrenal gland was noted to be adenoma on previous imaging. This adenoma measures  nearly 2 cm. There is a small cyst in the left kidney which is otherwise normal. The left ureter is normal. A focus of air in the bladder is likely from recent catheterization. The bladder is otherwise normal. The patient has a right pelvic kidney with at least 1 cysts and severe hydronephrosis, unchanged. The ureter is not dilated. There is mild excretion into the dilated collecting system on delayed images. Stomach/Bowel: The stomach and small bowel are normal. The colon and visualized portions of the appendix are normal. Vascular/Lymphatic: Atherosclerotic changes are seen in the nonaneurysmal aorta. No adenopathy. Reproductive: Prostate is unremarkable. Other: No abdominal wall hernia or abnormality. No abdominopelvic ascites. Musculoskeletal: Anterior wedging of L3 is new since 2016. No other acute bony abnormalities. IMPRESSION: 1. Two filling defects in the distal common bile duct measuring 14 and 9 mm are consistent with choledocholithiasis resulting in intra and extrahepatic biliary duct dilatation and distention of the gallbladder. 2. 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 3. Anterior wedging of L3 is new since 2016. It is age indeterminate but I would favor the finding is nonacute. Recommend clinical correlation. 4. Right adrenal adenoma. 5. Atherosclerotic changes in the nonaneurysmal aorta. Electronically Signed   By: Dorise Bullion III M.D   On: 09/25/2018 18:34   Dg Chest Portable 1 View  Result Date: 09/25/2018 CLINICAL DATA:  Cough and congestion. EXAM: PORTABLE CHEST  1 VIEW COMPARISON:  September 25, 2018 FINDINGS: Scoliotic curvature of the thoracic spine. Atelectasis in the left base. The heart, hila, mediastinum, lungs, and pleura are otherwise unchanged. IMPRESSION: No active disease. Electronically Signed   By: Dorise Bullion III M.D   On: 09/25/2018 18:52   Dg Chest Port 1 View  Result Date: 09/25/2018 CLINICAL DATA:   Altered mental status. EXAM: PORTABLE CHEST 1 VIEW COMPARISON:  Radiograph of October 19, 2016. FINDINGS: Stable cardiomegaly. Atherosclerosis of thoracic aorta is noted. Dextroscoliosis of thoracic spine is noted. No pneumothorax is noted. Lungs are clear. Bony thorax is unremarkable. IMPRESSION: No acute cardiopulmonary abnormality seen. Aortic Atherosclerosis (ICD10-I70.0). Electronically Signed   By: Marijo Conception, M.D.   On: 09/25/2018 14:50   Dg Ercp Biliary & Pancreatic Ducts  Result Date: 09/27/2018 CLINICAL DATA:  Bile duct stone removal EXAM: ERCP TECHNIQUE: Multiple spot images obtained with the fluoroscopic device and submitted for interpretation post-procedure. COMPARISON:  None. FINDINGS: 2 fluoroscopic spot images document endoscopic cannulation and opacification of the CBD with passage of a balloon tipped catheter. There is incomplete opacification of the intrahepatic biliary tree, which appears decompressed centrally. IMPRESSION: Endoscopic CBD cannulation and intervention. These images were submitted for radiologic interpretation only. Please see the procedural report for the amount of contrast and the fluoroscopy time utilized. Electronically Signed   By: Lucrezia Europe M.D.   On: 09/27/2018 09:25   Dg Swallowing Func-speech Pathology  Result Date: 09/29/2018 Objective Swallowing Evaluation: Type of Study: MBS-Modified Barium Swallow Study  Patient Details Name: Matthew Schultz MRN: [id number] Date of Birth: July 03, 1932 Today's Date: 09/29/2018 Time: SLP Start Time (ACUTE ONLY): 4098 -SLP Stop Time (ACUTE ONLY): 1520 SLP Time Calculation (min) (ACUTE ONLY): 16 min Past Medical History: Past Medical History: Diagnosis Date . Arthritis  . BPH (benign prostatic hyperplasia)  . CAD (coronary artery disease)  . DM (diabetes mellitus) ([place])  . HLD (hyperlipidemia)  . HTN (hypertension)  . Hydronephrosis  . Incomplete bladder emptying  . OA (osteoarthritis)  . Renal mass  . Stroke [facility])  Past Surgical  History: Past Surgical History: Procedure Laterality Date . BACK SURGERY   HPI: [name] with h/o BPH, CAD, DM, HLD, HTN, OA, Renal mass, and CVA, who presented to [facility] with altered mental status and sepsis.  CT confirmed choledocholithiasis with two obstructing stones and signs of cholangitis.  He was transferred to [facility] and underwent ERCP 10/1.  CXR 9/29 without active disease.  Subjective: pt alert but minimally conversant, not following commands well Assessment / Plan / Recommendation CHL IP CLINICAL IMPRESSIONS 09/29/2018 Clinical Impression Pt presented with moderate oropharyngeal dysphagia marked by pharyngeal weakness evidenced by reduced hyolaryngeal excursion and decreased coordination/timing of swallow mechanisms. Mild lingual/palatal residue and moderate vallecular residue noted across thin and nectar POs. Pt also penetrated thin bolus to the VF and aspirated without sensation, however volitional cough was effective to clear. While chin tuck strategy was effective in regards to its facilitation of complete airway closure with thin, pt's ability to execute commands to use the strategy was inconsistent and would therefore not be a reliable compensatory strategy. Full airway protection was present with nectar, puree, or regular textures, although prolonged mastication and oral transit observed with puree and regular, and premature spill to the valleculae X1 noted with nectar. Given current presentation and dentition, recommend D3 diet with nectar thick liquids, use slow/small bites and sips in a minimally distracting environment, and ST will continue to  provide treatment with diet safety and efficiency including opportunities for advancement.  SLP Visit Diagnosis Dysphagia, oropharyngeal phase (R13.12) Attention and concentration deficit following -- Frontal lobe and executive function deficit following -- Impact on safety and function Moderate aspiration risk   CHL IP TREATMENT RECOMMENDATION  09/29/2018 Treatment Recommendations Therapy as outlined in treatment plan below   Prognosis 09/29/2018 Prognosis for Safe Diet Advancement Good Barriers to Reach Goals Cognitive deficits Barriers/Prognosis Comment -- CHL IP DIET RECOMMENDATION 09/29/2018 SLP Diet Recommendations Dysphagia 3 (Mech soft) solids;Nectar thick liquid Liquid Administration via Cup;No straw Medication Administration Whole meds with puree Compensations Minimize environmental distractions;Slow rate;Small sips/bites Postural Changes Seated upright at 90 degrees   CHL IP OTHER RECOMMENDATIONS 09/29/2018 Recommended Consults -- Oral Care Recommendations Oral care BID Other Recommendations Order thickener from pharmacy   CHL IP FOLLOW UP RECOMMENDATIONS 09/29/2018 Follow up Recommendations Skilled Nursing facility   [facility] IP FREQUENCY AND DURATION 09/29/2018 Speech Therapy Frequency (ACUTE ONLY) min 2x/week Treatment Duration 2 weeks      CHL IP ORAL PHASE 09/29/2018 Oral Phase Impaired Oral - Pudding Teaspoon -- Oral - Pudding Cup -- Oral - Honey Teaspoon -- Oral - Honey Cup -- Oral - Nectar Teaspoon -- Oral - Nectar Cup Lingual/palatal residue;Premature spillage Oral - Nectar Straw -- Oral - Thin Teaspoon -- Oral - Thin Cup Holding of bolus Oral - Thin Straw -- Oral - Puree Lingual pumping;Delayed oral transit Oral - Mech Soft -- Oral - Regular Delayed oral transit;Impaired mastication Oral - Multi-Consistency -- Oral - Pill -- Oral Phase - Comment --  CHL IP PHARYNGEAL PHASE 09/29/2018 Pharyngeal Phase Impaired Pharyngeal- Pudding Teaspoon -- Pharyngeal -- Pharyngeal- Pudding Cup -- Pharyngeal -- Pharyngeal- Honey Teaspoon -- Pharyngeal -- Pharyngeal- Honey Cup -- Pharyngeal -- Pharyngeal- Nectar Teaspoon -- Pharyngeal -- Pharyngeal- Nectar Cup Pharyngeal residue - valleculae;Pharyngeal residue - pyriform Pharyngeal -- Pharyngeal- Nectar Straw -- Pharyngeal -- Pharyngeal- Thin Teaspoon -- Pharyngeal -- Pharyngeal- Thin Cup Penetration/Aspiration  during swallow;Compensatory strategies attempted (with notebox);Other (Comment) Pharyngeal Material enters airway, CONTACTS cords and not ejected out;Material enters airway, passes BELOW cords then ejected out Pharyngeal- Thin Straw -- Pharyngeal -- Pharyngeal- Puree WFL Pharyngeal -- Pharyngeal- Mechanical Soft -- Pharyngeal -- Pharyngeal- Regular WFL Pharyngeal -- Pharyngeal- Multi-consistency -- Pharyngeal -- Pharyngeal- Pill -- Pharyngeal -- Pharyngeal Comment --  CHL IP CERVICAL ESOPHAGEAL PHASE 09/29/2018 Cervical Esophageal Phase WFL Pudding Teaspoon -- Pudding Cup -- Honey Teaspoon -- Honey Cup -- Nectar Teaspoon -- Nectar Cup -- Nectar Straw -- Thin Teaspoon -- Thin Cup -- Thin Straw -- Puree -- Mechanical Soft -- Regular -- Multi-consistency -- Pill -- Cervical Esophageal Comment -- [place] Siren 09/29/2018, 4:47 PM Orbie Pyo Litaker M.Ed Actor Pager [phone] Office [phone]              US Abdomen Limited Ruq  Result Date: 09/25/2018 CLINICAL DATA:  Elevated LFTs EXAM: ULTRASOUND ABDOMEN LIMITED RIGHT UPPER QUADRANT COMPARISON:  None. FINDINGS: Gallbladder: Small amount of sludge. No cholelithiasis. No pericholecystic fluid. Thickened gallbladder wall measuring up to 4.5 mm. Negative sonographic Murphy sign. Common bile duct: Diameter: 8 mm distally. No obstructing mass or choledocholithiasis. Liver: No focal lesion identified. Within normal limits in parenchymal echogenicity. Portal vein is patent on color Doppler imaging with normal direction of blood flow towards the liver. IMPRESSION: No cholelithiasis. Mild gallbladder wall thickening which may be reactive secondary to hepatocellular disease given the elevated LFTs. No sonographic evidence of acute cholecystitis. Electronically Signed   By:  Kathreen Devoid   On: 09/25/2018 16:37     PERTINENT LAB RESULTS: CBC: Recent Labs    10/05/18 0524  WBC 15.3*  HGB 14.6  HCT 44.6  PLT 258   CMET CMP       Component Value Date/Time   NA 132 (L) 10/05/2018 0524   NA 141 11/29/2013 0622   K 4.8 10/05/2018 0524   K 4.2 11/28/2013 1610   CL 103 10/05/2018 0524   CL 108 (H) 11/28/2013 1610   CO2 21 (L) 10/05/2018 0524   CO2 21 11/28/2013 1610   GLUCOSE 146 (H) 10/05/2018 0524   GLUCOSE 132 (H) 11/28/2013 1610   BUN 24 (H) 10/05/2018 0524   BUN 25 09/07/2016 1103   BUN 27 (H) 11/28/2013 1610   CREATININE 0.92 10/05/2018 0524   CREATININE 0.71 11/28/2013 1610   CALCIUM 8.7 (L) 10/05/2018 0524   CALCIUM 8.2 (L) 11/28/2013 1610   PROT 5.5 (L) 10/01/2018 0401   PROT 6.8 11/28/2013 1610   ALBUMIN 2.6 (L) 10/01/2018 0401   ALBUMIN 3.5 11/28/2013 1610   AST 30 10/01/2018 0401   AST 28 11/28/2013 1610   ALT 50 (H) 10/01/2018 0401   ALT BLOOD 04/13/2018 0420   ALKPHOS 179 (H) 10/01/2018 0401   ALKPHOS 69 11/28/2013 1610   BILITOT 1.3 (H) 10/01/2018 0401   BILITOT 1.0 11/28/2013 1610   GFRNONAA >60 10/05/2018 0524   GFRNONAA >60 11/28/2013 1610   GFRAA >60 10/05/2018 0524   GFRAA >60 11/28/2013 1610    GFR Estimated Creatinine Clearance: 53.9 mL/min (by C-G formula based on SCr of 0.92 mg/dL). No results for input(s): LIPASE, AMYLASE in the last 72 hours. No results for input(s): CKTOTAL, CKMB, CKMBINDEX, TROPONINI in the last 72 hours. Invalid input(s): POCBNP No results for input(s): DDIMER in the last 72 hours. No results for input(s): HGBA1C in the last 72 hours. No results for input(s): CHOL, HDL, LDLCALC, TRIG, CHOLHDL, LDLDIRECT in the last 72 hours. No results for input(s): TSH, T4TOTAL, T3FREE, THYROIDAB in the last 72 hours.  Invalid input(s): FREET3 No results for input(s): VITAMINB12, FOLATE, FERRITIN, TIBC, IRON, RETICCTPCT in the last 72 hours. Coags: No results for input(s): INR in the last 72 hours.  Invalid input(s): PT Microbiology: Recent Results (from the past 240 hour(s))  Culture, blood (routine x 2)     Status: Abnormal   Collection Time: 09/25/18  2:41  PM  Result Value Ref Range Status   Specimen Description   Final    BLOOD RAC Performed at [facility], [address], [place], [place] [address]    Special Requests   Final    BOTTLES DRAWN AEROBIC AND ANAEROBIC Blood Culture adequate volume Performed at [facility], [address], [place], [place] [address]    Culture  Setup Time   Final    GRAM NEGATIVE RODS IN BOTH AEROBIC AND ANAEROBIC BOTTLES CRITICAL RESULT CALLED TO, READ BACK BY AND VERIFIED WITH: DAVID BESANTI AT 2993 09/26/18.PMH Performed at [facility], [place] [address] [place], [place] [address]    Culture ESCHERICHIA COLI (A)  Final   Report Status 09/29/2018 FINAL  Final   Organism ID, Bacteria ESCHERICHIA COLI  Final      Susceptibility   Escherichia coli - MIC*    AMPICILLIN <=2 SENSITIVE Sensitive     CEFAZOLIN <=4 SENSITIVE Sensitive     CEFEPIME <=1 SENSITIVE Sensitive     CEFTAZIDIME <=1 SENSITIVE Sensitive  CEFTRIAXONE <=1 SENSITIVE Sensitive     CIPROFLOXACIN <=0.25 SENSITIVE Sensitive     GENTAMICIN <=1 SENSITIVE Sensitive     IMIPENEM <=0.25 SENSITIVE Sensitive     TRIMETH/SULFA <=20 SENSITIVE Sensitive     AMPICILLIN/SULBACTAM <=2 SENSITIVE Sensitive     PIP/TAZO <=4 SENSITIVE Sensitive     Extended ESBL NEGATIVE Sensitive     * ESCHERICHIA COLI  Culture, blood (routine x 2)     Status: Abnormal   Collection Time: 09/25/18  2:41 PM  Result Value Ref Range Status   Specimen Description   Final    BLOOD L AC Performed at [facility], [address] [place], [place] [address]    Special Requests   Final    BOTTLES DRAWN AEROBIC AND ANAEROBIC Blood Culture adequate volume Performed at [facility], [address] [place], [place] [address]    Culture  Setup Time   Final    GRAM NEGATIVE RODS IN BOTH AEROBIC AND ANAEROBIC BOTTLES CRITICAL RESULT CALLED TO, READ BACK BY AND VERIFIED WITH: DAVID BESANTI AT 6301 09/26/18.PMH Performed at [facility], [address] [place], [place] [address]    Culture (A)  Final    ESCHERICHIA COLI SUSCEPTIBILITIES PERFORMED ON PREVIOUS CULTURE WITHIN THE LAST 5 DAYS. ENTEROCOCCUS CASSELIFLAVUS CRITICAL RESULT CALLED TO, READ BACK BY AND VERIFIED WITH: [place] [address] FCP Performed at [facility], [place] [address] [place], [place] [address]    Report Status 09/30/2018 FINAL  Final   Organism ID, Bacteria ENTEROCOCCUS CASSELIFLAVUS  Final      Susceptibility   Enterococcus casseliflavus - MIC*    AMPICILLIN <=2 SENSITIVE Sensitive     VANCOMYCIN RESISTANT Resistant     GENTAMICIN SYNERGY SENSITIVE Sensitive     LINEZOLID 2 SENSITIVE Sensitive     * ENTEROCOCCUS CASSELIFLAVUS  Blood Culture ID Panel (Reflexed)     Status: Abnormal   Collection Time: 09/25/18  2:41 PM  Result Value Ref Range Status   Enterococcus species NOT DETECTED NOT DETECTED Final   Listeria monocytogenes NOT DETECTED NOT DETECTED Final   Staphylococcus species NOT DETECTED NOT DETECTED Final   Staphylococcus aureus (BCID) NOT DETECTED NOT DETECTED Final   Streptococcus species NOT DETECTED NOT DETECTED Final   Streptococcus agalactiae NOT DETECTED NOT DETECTED Final   Streptococcus pneumoniae NOT DETECTED NOT DETECTED Final   Streptococcus pyogenes NOT DETECTED NOT DETECTED Final   Acinetobacter baumannii NOT DETECTED NOT DETECTED Final   Enterobacteriaceae species DETECTED (A) NOT DETECTED Final    Comment: Enterobacteriaceae represent a large family of gram-negative bacteria, not a single organism. CRITICAL RESULT CALLED TO, READ BACK BY AND VERIFIED WITH: DAVID BESANTI AT 7322 09/26/18.PMH    Enterobacter cloacae complex NOT DETECTED NOT DETECTED Final   Escherichia coli DETECTED (A) NOT DETECTED Final    Comment: CRITICAL RESULT CALLED TO, READ BACK BY AND VERIFIED WITH: DAVID BESANTI AT 0254 09/26/18.PMH    Klebsiella oxytoca NOT DETECTED NOT DETECTED Final   Klebsiella pneumoniae  NOT DETECTED NOT DETECTED Final   Proteus species NOT DETECTED NOT DETECTED Final   Serratia marcescens NOT DETECTED NOT DETECTED Final   Carbapenem resistance NOT DETECTED NOT DETECTED Final   Haemophilus influenzae NOT DETECTED NOT DETECTED Final   Neisseria meningitidis NOT DETECTED NOT DETECTED Final   Pseudomonas aeruginosa NOT DETECTED NOT DETECTED Final   Candida albicans NOT DETECTED NOT DETECTED Final   Candida glabrata NOT DETECTED NOT DETECTED Final  Candida krusei NOT DETECTED NOT DETECTED Final   Candida parapsilosis NOT DETECTED NOT DETECTED Final   Candida tropicalis NOT DETECTED NOT DETECTED Final    Comment: Performed at [facility], [address] [place], [place] [address]  MRSA PCR Screening     Status: None   Collection Time: 09/25/18 11:18 PM  Result Value Ref Range Status   MRSA by PCR NEGATIVE NEGATIVE Final    Comment:        The GeneXpert MRSA Assay (FDA approved for NASAL specimens only), is one component of a comprehensive MRSA colonization surveillance program. It is not intended to diagnose MRSA infection nor to guide or monitor treatment for MRSA infections. Performed at [facility], [place] [address] [place], [place] [address]   Culture, blood (Routine X 2) w Reflex to ID Panel     Status: None   Collection Time: 09/29/18  2:20 PM  Result Value Ref Range Status   Specimen Description BLOOD RIGHT ARM  Final   Special Requests   Final    BOTTLES DRAWN AEROBIC ONLY Blood Culture adequate volume   Culture   Final    NO GROWTH 5 DAYS Performed at [facility], [address], [place], [place] [address]    Report Status 10/04/2018 FINAL  Final  Culture, blood (Routine X 2) w Reflex to ID Panel     Status: None   Collection Time: 09/29/18  2:24 PM  Result Value Ref Range Status   Specimen Description BLOOD LEFT ARM  Final   Special Requests   Final    BOTTLES DRAWN AEROBIC ONLY Blood Culture adequate volume   Culture    Final    NO GROWTH 5 DAYS Performed at [facility], [place] [address], [place], [place] [address]    Report Status 10/04/2018 FINAL  Final    FURTHER DISCHARGE INSTRUCTIONS:  Get Medicines reviewed and adjusted: Please take all your medications with you for your next visit with your Primary MD  Laboratory/radiological data: Please request your Primary MD to go over all hospital tests and procedure/radiological results at the follow up, please ask your Primary MD to get all Hospital records sent to his/her office.  In some cases, they will be blood work, cultures and biopsy results pending at the time of your discharge. Please request that your primary care M.D. goes through all the records of your hospital data and follows up on these results.  Also Note the following: If you experience worsening of your admission symptoms, develop shortness of breath, life threatening emergency, suicidal or homicidal thoughts you must seek medical attention immediately by calling 911 or calling your MD immediately  if symptoms less severe.  You must read complete instructions/literature along with all the possible adverse reactions/side effects for all the Medicines you take and that have been prescribed to you. Take any new Medicines after you have completely understood and accpet all the possible adverse reactions/side effects.   Do not drive when taking Pain medications or sleeping medications (Benzodaizepines)  Do not take more than prescribed Pain, Sleep and Anxiety Medications. It is not advisable to combine anxiety,sleep and pain medications without talking with your primary care practitioner  Special Instructions: If you have smoked or chewed Tobacco  in the last 2 yrs please stop smoking, stop any regular Alcohol  and or any Recreational drug use.  Wear Seat belts while driving.  Please note: You were cared for by a hospitalist  during your hospital stay. Once you are discharged, your  primary care physician will handle any further medical issues. Please note that NO REFILLS for any discharge medications will be authorized once you are discharged, as it is imperative that you return to your primary care physician (or establish a relationship with a primary care physician if you do not have one) for your post hospital discharge needs so that they can reassess your need for medications and monitor your lab values.  Total Time spent coordinating discharge including counseling, education and face to face time equals 35 minutes.  SignedOren Binet 10/05/2018 2:39 PM

## 2018-10-03 NOTE — Progress Notes (Signed)
Physical Therapy Treatment Patient Details Name: Matthew Schultz MRN: [id number] DOB: 11/12/1932 Today's Date: 10/03/2018    History of Present Illness 84 yo male with BPH, CAD, DM, HTN, arthritis, renal mass, CVA presented to [facility] with altered mental status and sepsis.  CT confirmed choledocholithiasis with two obstructing stones and signs of cholangitis.  He was transferred to [facility] and underwent ERCP 10/1. Awaiting sx - probably 10/3    PT Comments    Patient seen for OOB activity and ambulation attempts. Currently, patient continues to require 2 person physical assist and heavy reliance on upright support. Initiated som in-room ambulation but with maximal assist. Patient also appears less interactive and with more of a flat affect this session. Current POC remains appropriate.   Follow Up Recommendations  SNF;Supervision/Assistance - 24 hour     Equipment Recommendations  Other (comment)(defer to next venue)    Recommendations for Other Services       Precautions / Restrictions Precautions Precautions: Fall Restrictions Weight Bearing Restrictions: No    Mobility  Bed Mobility Overal bed mobility: Needs Assistance Bed Mobility: Rolling;Supine to Sit;Sit to Supine Rolling: Mod assist   Supine to sit: Mod assist;+2 for safety/equipment Sit to supine: Mod assist;+2 for safety/equipment   General bed mobility comments: Assist to elevate trunk to upright and rotate to EOB, increased assist to return to supine and reposition in bed  Transfers Overall transfer level: Needs assistance Equipment used: 2 person hand held assist;Rolling walker (2 wheeled) Transfers: Sit to/from [facility] Sit to Stand: Mod assist;+2 physical assistance Stand pivot transfers: Mod assist;+2 physical assistance;+2 safety/equipment       General transfer comment: Performed transfer to chair and then transfer for ambulatino with RW  Ambulation/Gait Ambulation/Gait assistance: Max  assist;+2 physical assistance;+2 safety/equipment Gait Distance (Feet): 12 Feet Assistive device: Rolling walker (2 wheeled) Gait Pattern/deviations: Step-to pattern;Decreased stride length;Shuffle;Festinating;Trunk flexed Gait velocity: non functional cadence  Gait velocity interpretation: <1.31 ft/sec, indicative of household ambulator General Gait Details: patient never completely elevating to upright position, max cues for posture and positioning. Increased time and effort to perform. flexed posture and heavy reliance on RW for support   Stairs             Wheelchair Mobility    Modified Rankin (Stroke Patients Only)       Balance Overall balance assessment: Needs assistance Sitting-balance support: Bilateral upper extremity supported;Feet supported Sitting balance-Leahy Scale: Fair Sitting balance - Comments: min guard for safety   Standing balance support: Bilateral upper extremity supported Standing balance-Leahy Scale: Poor Standing balance comment: dependent on UE support from therapy team and RW                            Cognition Arousal/Alertness: Awake/alert Behavior During Therapy: Flat affect Overall Cognitive Status: No family/caregiver present to determine baseline cognitive functioning Area of Impairment: Orientation;Attention;Memory;Following commands;Safety/judgement;Awareness;Problem solving                 Orientation Level: Disoriented to;Place;Time;Situation Current Attention Level: Sustained Memory: Decreased recall of precautions;Decreased short-term memory Following Commands: Follows one step commands with increased time;Follows one step commands inconsistently Safety/Judgement: Decreased awareness of safety;Decreased awareness of deficits Awareness: Intellectual Problem Solving: Decreased initiation;Requires verbal cues;Requires tactile cues General Comments: patient more flat affect and less interactive this session       Exercises      General Comments General comments (skin integrity, edema, etc.): hygiene and pericare performed  during session      Pertinent Vitals/Pain Pain Assessment: Faces Faces Pain Scale: Hurts little more Pain Location: generalized Pain Descriptors / Indicators: Grimacing    Home Living                      Prior Function            PT Goals (current goals can now be found in the care plan section) Acute Rehab PT Goals Patient Stated Goal: none stated PT Goal Formulation: Patient unable to participate in goal setting Time For Goal Achievement: 10/12/18 Potential to Achieve Goals: Fair Progress towards PT goals: Progressing toward goals    Frequency    Min 2X/week      PT Plan Current plan remains appropriate    Co-evaluation PT/OT/SLP Co-Evaluation/Treatment: Yes Reason for Co-Treatment: Complexity of the patient's impairments (multi-system involvement) PT goals addressed during session: Mobility/safety with mobility OT goals addressed during session: ADL's and self-care      AM-PAC PT "6 Clicks" Daily Activity  Outcome Measure  Difficulty turning over in bed (including adjusting bedclothes, sheets and blankets)?: A Lot Difficulty moving from lying on back to sitting on the side of the bed? : Unable Difficulty sitting down on and standing up from a chair with arms (e.g., wheelchair, bedside commode, etc,.)?: Unable Help needed moving to and from a bed to chair (including a wheelchair)?: A Lot Help needed walking in hospital room?: A Lot Help needed climbing 3-5 steps with a railing? : Total 6 Click Score: 9    End of Session Equipment Utilized During Treatment: Gait belt Activity Tolerance: Patient tolerated treatment well Patient left: in bed;with call bell/phone within reach;with bed alarm set;with restraints reapplied;Other (comment)(with telesitter and mits) Nurse Communication: Mobility status(hygiene and pericare performed/condom  cath off) PT Visit Diagnosis: Unsteadiness on feet (R26.81);Other abnormalities of gait and mobility (R26.89);Difficulty in walking, not elsewhere classified (R26.2)     Time: 9833-8250 PT Time Calculation (min) (ACUTE ONLY): 23 min  Charges:  $Therapeutic Activity: 8-22 mins                     Alben Deeds, PT DPT  Board Certified Neurologic Specialist [place] Pager [phone] Office [phone]    Duncan Dull 10/03/2018, 9:27 AM

## 2018-10-04 DIAGNOSIS — K8031 Calculus of bile duct with cholangitis, unspecified, with obstruction: Secondary | ICD-10-CM

## 2018-10-04 LAB — CULTURE, BLOOD (ROUTINE X 2)
Culture: NO GROWTH
Culture: NO GROWTH
Special Requests: ADEQUATE
Special Requests: ADEQUATE

## 2018-10-04 LAB — GLUCOSE, CAPILLARY
GLUCOSE-CAPILLARY: 133 mg/dL — AB (ref 70–99)
GLUCOSE-CAPILLARY: 157 mg/dL — AB (ref 70–99)
GLUCOSE-CAPILLARY: 140 mg/dL — AB (ref 70–99)
Glucose-Capillary: 154 mg/dL — ABNORMAL HIGH (ref 70–99)

## 2018-10-04 NOTE — Progress Notes (Addendum)
PROGRESS NOTE        PATIENT DETAILS Name: Matthew Schultz Age: 84 y.o. Sex: male Date of Birth: [birth year] Admit Date: 09/25/2018 Admitting Physician Tanda Rockers, MD HEN:IDPOEUM, [name], MD  Brief Narrative: Patient is a 84 y.o. male with a history of BPH, CAD, DM, dyslipidemia presented with altered mental status, patient was found to have sepsis secondary to cholangitis and E. coli and enterococcal bacteremia.  Underwent ERCP and stone removal, given overall frailty/delirium/aspiration issues-decided against pursuing a cholecystectomy.  Plans are to continue with gentle medical treatment-awaiting SNF bed.  Subjective: This is in the mornings-but in the afternoons he is able to follow commands.  No major issues overnight Assessment/Plan: Sepsis secondary to acute cholangitis and polymicrobial (enterococcal, E. coli) bacteremia: Sepsis pathophysiology has resolved, underwent ERCP on 10/1.  Blood cultures positive for E. coli and enterococcus, evaluated by infectious disease with recommendations to continue with amoxicillin until 10/8.  Due to advanced age-overall frailty-aspiration pneumonia-and significant issues with delirium-not felt to be optimal candidate for cholecystectomy.  He has a sphincterotomy in place-and does not have documented gallstones on imaging.  This MD and general surgery discussed with family-they are aware of the difficult situation and agreeable to cautious observation without performing a cholecystectomy.   Dementia with delirium: Continues to have episodes of delirium during this hospital stay-continue low-dose risperidone.  Dysphagia: Evaluated by speech therapy-continue dysphagia 3 diet.  Family aware of aspiration risk and accepting.  Disseminated discussed with family at bedside on 10/3.  Acute kidney injury: Hemodynamically mediated due to sepsis-has resolved.  Hypokalemia: Repleted-recheck periodically  GERD: Continue  PPI  BPH: Continue Flomax  DM-2: CBGs stable with SSI.    Incidental right adrenal adenoma: Stable for outpatient follow-up  Deconditioning/debility: Worsened by acute illness-he is much much weaker and deconditioned than usual baseline-probably will require SNF on discharge.  Although has dementia-he is able to follow some commands especially in the afternoons.  DVT Prophylaxis: Prophylactic Heparin   Code Status: Full code   Family Communication: None at bedside  Disposition Plan: Remain inpatient-SNF at [facility] authorization-informed by social work that the patient has been denied-I have asked for peer to peer.  Antimicrobial agents: Anti-infectives (From admission, onward)   Start     Dose/Rate Route Frequency Ordered Stop   10/03/18 0000  amoxicillin (AMOXIL) 500 MG capsule     500 mg Oral Every 8 hours 10/03/18 1120     09/30/18 1015  amoxicillin (AMOXIL) capsule 500 mg     500 mg Oral Every 8 hours 09/30/18 1012 10/05/18 0559   09/29/18 1500  ampicillin (OMNIPEN) 2 g in sodium chloride 0.9 % 100 mL IVPB  Status:  Discontinued     2 g 300 mL/hr over 20 Minutes Intravenous Every 4 hours 09/29/18 1359 09/30/18 1012   09/28/18 1400  ceFAZolin (ANCEF) IVPB 1 g/50 mL premix  Status:  Discontinued     1 g 100 mL/hr over 30 Minutes Intravenous Every 8 hours 09/28/18 1212 09/29/18 1359   09/27/18 1700  cefTRIAXone (ROCEPHIN) 2 g in sodium chloride 0.9 % 100 mL IVPB  Status:  Discontinued     2 g 200 mL/hr over 30 Minutes Intravenous Every 24 hours 09/27/18 1625 09/28/18 1212   09/26/18 0600  piperacillin-tazobactam (ZOSYN) IVPB 3.375 g  Status:  Discontinued     3.375 g  12.5 mL/hr over 240 Minutes Intravenous Every 8 hours 09/26/18 0411 09/27/18 1625      Procedures: 10/1>> ERCP  CONSULTS:  GI and general surgery  Time spent: 25- minutes-Greater than 50% of this time was spent in counseling, explanation of diagnosis, planning of further  management, and coordination of care.  MEDICATIONS: Scheduled Meds: . amoxicillin  500 mg Oral Q8H  . aspirin EC  81 mg Oral Daily  . donepezil  5 mg Oral Daily  . gabapentin  300 mg Oral QHS  . heparin  5,000 Units Subcutaneous Q8H  . insulin aspart  0-5 Units Subcutaneous QHS  . insulin aspart  0-9 Units Subcutaneous TID WC  . pantoprazole  40 mg Oral Daily  . potassium chloride  40 mEq Oral BID  . risperiDONE  0.5 mg Oral QHS  . tamsulosin  0.4 mg Oral Daily   Continuous Infusions:  PRN Meds:.   PHYSICAL EXAM: Vital signs: Vitals:   10/03/18 1427 10/03/18 1743 10/03/18 2158 10/04/18 0555  BP: 103/82 105/70 115/77 120/84  Pulse: (!) 110 97 94 74  Resp: 20 18 19    Temp: 99 F (37.2 C) 98.4 F (36.9 C) 98.2 F (36.8 C)   TempSrc: Oral Oral    SpO2: 96% 97% 96% 91%  Weight:      Height:       Filed Weights   09/28/18 0500 09/30/18 0500 10/01/18 0500  Weight: 74.2 kg 67.8 kg 68 kg   Body mass index is 24.2 kg/m.   General appearance:Awake, pleasantly confused-follow some commands. Eyes:no scleral icterus. HEENT: Atraumatic and Normocephalic Neck: supple, no JVD. Resp:Good air entry bilaterally,no rales or rhonchi CVS: S1 S2 regular, no murmurs.  GI: Bowel sounds present, Non tender and not distended with no gaurding, rigidity or rebound. Extremities: B/L Lower Ext shows no edema, both legs are warm to touch Neurology:  Non focal Musculoskeletal:No digital cyanosis Skin:No Rash, warm and dry Wounds:N/A  I have personally reviewed following labs and imaging studies  LABORATORY DATA: CBC: Recent Labs  Lab 09/28/18 0407 09/29/18 0750  WBC 8.5 5.2  NEUTROABS 7.3  --   HGB 12.0* 11.5*  HCT 35.3* 34.7*  MCV 90.1 90.4  PLT 140* 141*    Basic Metabolic Panel: Recent Labs  Lab 09/28/18 0407 09/29/18 0444 09/30/18 0659 10/01/18 0401  NA 133* 137 139 136  K 3.3* 3.2* 3.1* 3.9  CL 107 109 103 107  CO2 22 21* 24 21*  GLUCOSE 205* 105* 124* 147*   BUN 20 13 8 11   CREATININE 1.03 0.81 0.82 0.87  CALCIUM 7.4* 7.7* 8.2* 8.1*    GFR: Estimated Creatinine Clearance: 57 mL/min (by C-G formula based on SCr of 0.87 mg/dL).  Liver Function Tests: Recent Labs  Lab 09/28/18 0407 09/29/18 0444 09/30/18 0659 10/01/18 0401  AST 33 29 36 30  ALT 92* 67* 60* 50*  ALKPHOS 150* 145* 188* 179*  BILITOT 1.2 1.4* 1.7* 1.3*  PROT 5.5* 5.2* 6.0* 5.5*  ALBUMIN 2.4* 2.4* 2.8* 2.6*   No results for input(s): LIPASE, AMYLASE in the last 168 hours. No results for input(s): AMMONIA in the last 168 hours.  Coagulation Profile: No results for input(s): INR, PROTIME in the last 168 hours.  Cardiac Enzymes: Recent Labs  Lab 09/29/18 0750  TROPONINI <0.03    BNP (last 3 results) No results for input(s): PROBNP in the last 8760 hours.  HbA1C: No results for input(s): HGBA1C in the last 72 hours.  CBG: Recent  Labs  Lab 10/03/18 1237 10/03/18 1749 10/03/18 2157 10/04/18 0821 10/04/18 1119  GLUCAP 154* 164* 127* 133* 157*    Lipid Profile: No results for input(s): CHOL, HDL, LDLCALC, TRIG, CHOLHDL, LDLDIRECT in the last 72 hours.  Thyroid Function Tests: No results for input(s): TSH, T4TOTAL, FREET4, T3FREE, THYROIDAB in the last 72 hours.  Anemia Panel: No results for input(s): VITAMINB12, FOLATE, FERRITIN, TIBC, IRON, RETICCTPCT in the last 72 hours.  Urine analysis:    Component Value Date/Time   COLORURINE AMBER (A) 09/25/2018 1440   APPEARANCEUR CLEAR (A) 09/25/2018 1440   APPEARANCEUR Clear 11/28/2013 1712   LABSPEC 1.019 09/25/2018 1440   LABSPEC 1.019 11/28/2013 1712   PHURINE 6.0 09/25/2018 1440   GLUCOSEU 50 (A) 09/25/2018 1440   GLUCOSEU Negative 11/28/2013 1712   HGBUR NEGATIVE 09/25/2018 1440   BILIRUBINUR SMALL (A) 09/25/2018 1440   BILIRUBINUR Negative 11/28/2013 1712   KETONESUR NEGATIVE 09/25/2018 1440   PROTEINUR 30 (A) 09/25/2018 1440   NITRITE NEGATIVE 09/25/2018 1440   LEUKOCYTESUR NEGATIVE  09/25/2018 1440   LEUKOCYTESUR Negative 11/28/2013 1712    Sepsis Labs: Lactic Acid, Venous    Component Value Date/Time   LATICACIDVEN 1.7 09/26/2018 0350    MICROBIOLOGY: Recent Results (from the past 240 hour(s))  Culture, blood (routine x 2)     Status: Abnormal   Collection Time: 09/25/18  2:41 PM  Result Value Ref Range Status   Specimen Description   Final    BLOOD RAC Performed at [facility], [address] [place], [place] [address]    Special Requests   Final    BOTTLES DRAWN AEROBIC AND ANAEROBIC Blood Culture adequate volume Performed at [facility], [address] [place], [place] [address]    Culture  Setup Time   Final    GRAM NEGATIVE RODS IN BOTH AEROBIC AND ANAEROBIC BOTTLES CRITICAL RESULT CALLED TO, READ BACK BY AND VERIFIED WITH: DAVID BESANTI AT 9629 09/26/18.PMH Performed at [facility], [place] [address] [place], [place] [address]    Culture ESCHERICHIA COLI (A)  Final   Report Status 09/29/2018 FINAL  Final   Organism ID, Bacteria ESCHERICHIA COLI  Final      Susceptibility   Escherichia coli - MIC*    AMPICILLIN <=2 SENSITIVE Sensitive     CEFAZOLIN <=4 SENSITIVE Sensitive     CEFEPIME <=1 SENSITIVE Sensitive     CEFTAZIDIME <=1 SENSITIVE Sensitive     CEFTRIAXONE <=1 SENSITIVE Sensitive     CIPROFLOXACIN <=0.25 SENSITIVE Sensitive     GENTAMICIN <=1 SENSITIVE Sensitive     IMIPENEM <=0.25 SENSITIVE Sensitive     TRIMETH/SULFA <=20 SENSITIVE Sensitive     AMPICILLIN/SULBACTAM <=2 SENSITIVE Sensitive     PIP/TAZO <=4 SENSITIVE Sensitive     Extended ESBL NEGATIVE Sensitive     * ESCHERICHIA COLI  Culture, blood (routine x 2)     Status: Abnormal   Collection Time: 09/25/18  2:41 PM  Result Value Ref Range Status   Specimen Description   Final    BLOOD L AC Performed at [facility] [address] [place], [place] [address]    Special Requests   Final    BOTTLES DRAWN AEROBIC AND ANAEROBIC Blood  Culture adequate volume Performed at [facility], [address] [place], [place] [address]    Culture  Setup Time   Final    GRAM NEGATIVE RODS IN BOTH AEROBIC AND ANAEROBIC BOTTLES CRITICAL RESULT CALLED TO,  READ BACK BY AND VERIFIED WITH: DAVID BESANTI AT 3846 09/26/18.PMH Performed at [facility], [address] [place], [place] [address]    Culture (A)  Final    ESCHERICHIA COLI SUSCEPTIBILITIES PERFORMED ON PREVIOUS CULTURE WITHIN THE LAST 5 DAYS. ENTEROCOCCUS CASSELIFLAVUS CRITICAL RESULT CALLED TO, READ BACK BY AND VERIFIED WITH: Drew B Cary [id number] FCP Performed at [facility], [place] [address], [place], [place] [address]    Report Status 09/30/2018 FINAL  Final   Organism ID, Bacteria ENTEROCOCCUS CASSELIFLAVUS  Final      Susceptibility   Enterococcus casseliflavus - MIC*    AMPICILLIN <=2 SENSITIVE Sensitive     VANCOMYCIN RESISTANT Resistant     GENTAMICIN SYNERGY SENSITIVE Sensitive     LINEZOLID 2 SENSITIVE Sensitive     * ENTEROCOCCUS CASSELIFLAVUS  Blood Culture ID Panel (Reflexed)     Status: Abnormal   Collection Time: 09/25/18  2:41 PM  Result Value Ref Range Status   Enterococcus species NOT DETECTED NOT DETECTED Final   Listeria monocytogenes NOT DETECTED NOT DETECTED Final   Staphylococcus species NOT DETECTED NOT DETECTED Final   Staphylococcus aureus (BCID) NOT DETECTED NOT DETECTED Final   Streptococcus species NOT DETECTED NOT DETECTED Final   Streptococcus agalactiae NOT DETECTED NOT DETECTED Final   Streptococcus pneumoniae NOT DETECTED NOT DETECTED Final   Streptococcus pyogenes NOT DETECTED NOT DETECTED Final   Acinetobacter baumannii NOT DETECTED NOT DETECTED Final   Enterobacteriaceae species DETECTED (A) NOT DETECTED Final    Comment: Enterobacteriaceae represent a large family of gram-negative bacteria, not a single organism. CRITICAL RESULT CALLED TO, READ BACK BY AND VERIFIED WITH: DAVID BESANTI AT 7939  09/26/18.PMH    Enterobacter cloacae complex NOT DETECTED NOT DETECTED Final   Escherichia coli DETECTED (A) NOT DETECTED Final    Comment: CRITICAL RESULT CALLED TO, READ BACK BY AND VERIFIED WITH: DAVID BESANTI AT 0300 09/26/18.PMH    Klebsiella oxytoca NOT DETECTED NOT DETECTED Final   Klebsiella pneumoniae NOT DETECTED NOT DETECTED Final   Proteus species NOT DETECTED NOT DETECTED Final   Serratia marcescens NOT DETECTED NOT DETECTED Final   Carbapenem resistance NOT DETECTED NOT DETECTED Final   Haemophilus influenzae NOT DETECTED NOT DETECTED Final   Neisseria meningitidis NOT DETECTED NOT DETECTED Final   Pseudomonas aeruginosa NOT DETECTED NOT DETECTED Final   Candida albicans NOT DETECTED NOT DETECTED Final   Candida glabrata NOT DETECTED NOT DETECTED Final   Candida krusei NOT DETECTED NOT DETECTED Final   Candida parapsilosis NOT DETECTED NOT DETECTED Final   Candida tropicalis NOT DETECTED NOT DETECTED Final    Comment: Performed at [facility], [address] [place], [place] [address]  MRSA PCR Screening     Status: None   Collection Time: 09/25/18 11:18 PM  Result Value Ref Range Status   MRSA by PCR NEGATIVE NEGATIVE Final    Comment:        The GeneXpert MRSA Assay (FDA approved for NASAL specimens only), is one component of a comprehensive MRSA colonization surveillance program. It is not intended to diagnose MRSA infection nor to guide or monitor treatment for MRSA infections. Performed at [facility], [place] [address], [place],  [address]   Culture, blood (Routine X 2) w Reflex to ID Panel     Status: None   Collection Time: 09/29/18  2:20 PM  Result Value Ref Range Status   Specimen Description BLOOD RIGHT ARM  Final  Special Requests   Final    BOTTLES DRAWN AEROBIC ONLY Blood Culture adequate volume   Culture   Final    NO GROWTH 5 DAYS Performed at [facility], [place] [address] [place], [place] [address]    Report  Status 10/04/2018 FINAL  Final  Culture, blood (Routine X 2) w Reflex to ID Panel     Status: None   Collection Time: 09/29/18  2:24 PM  Result Value Ref Range Status   Specimen Description BLOOD LEFT ARM  Final   Special Requests   Final    BOTTLES DRAWN AEROBIC ONLY Blood Culture adequate volume   Culture   Final    NO GROWTH 5 DAYS Performed at [facility], [place] [address], [place], [place] [address]    Report Status 10/04/2018 FINAL  Final    RADIOLOGY STUDIES/RESULTS: Ct Head Wo Contrast  Result Date: 09/25/2018 CLINICAL DATA:  Altered LOC EXAM: CT HEAD WITHOUT CONTRAST TECHNIQUE: Contiguous axial images were obtained from the base of the skull through the vertex without intravenous contrast. COMPARISON:  MRI brain 11/29/2013, CT brain 11/28/2013 FINDINGS: Brain: No acute territorial infarction, hemorrhage or intracranial mass is visualized. Moderate-to-marked atrophy. Moderate small vessel ischemic changes of the white matter. Stable ventricle size. Vascular: No hyperdense vessel.  Carotid vascular calcification Skull: No fracture. Stable heterogenous lucency in the right parietal bone. Sinuses/Orbits: No acute finding. Other: None IMPRESSION: 1. No CT evidence for acute intracranial abnormality. 2. Atrophy with small vessel ischemic changes of the white matter. Electronically Signed   By: Donavan Foil M.D.   On: 09/25/2018 15:00   Ct Abdomen Pelvis W Contrast  Result Date: 09/25/2018 CLINICAL DATA:  Right-sided abdominal pain. EXAM: CT ABDOMEN AND PELVIS WITH CONTRAST TECHNIQUE: Multidetector CT imaging of the abdomen and pelvis was performed using the standard protocol following bolus administration of intravenous contrast. CONTRAST:  113mL ISOVUE-300 IOPAMIDOL (ISOVUE-300) INJECTION 61% COMPARISON:  Ultrasound September 25, 2018 and CT scan June 26, 2014 FINDINGS: Lower chest: No acute abnormality. Hepatobiliary: Intra and extrahepatic biliary duct dilatation is identified. The  common bile duct measures up to 2 cm. At least 2 filling defects are seen in the distal common bile duct with the largest measuring 14 mm on coronal image 41 and a smaller more distal apparent defect measuring 9 mm on coronal image 43. There is distention of the gallbladder. No liver mass identified. Portal vein is patent. Pancreas: Unremarkable. No pancreatic ductal dilatation or surrounding inflammatory changes. Spleen: Normal in size without focal abnormality. Adrenals/Urinary Tract: The left adrenal gland is normal. A low-attenuation mass in the right adrenal gland was noted to be adenoma on previous imaging. This adenoma measures nearly 2 cm. There is a small cyst in the left kidney which is otherwise normal. The left ureter is normal. A focus of air in the bladder is likely from recent catheterization. The bladder is otherwise normal. The patient has a right pelvic kidney with at least 1 cysts and severe hydronephrosis, unchanged. The ureter is not dilated. There is mild excretion into the dilated collecting system on delayed images. Stomach/Bowel: The stomach and small bowel are normal. The colon and visualized portions of the appendix are normal. Vascular/Lymphatic: Atherosclerotic changes are seen in the nonaneurysmal aorta. No adenopathy. Reproductive: Prostate is unremarkable. Other: No abdominal wall hernia or abnormality. No abdominopelvic ascites. Musculoskeletal: Anterior wedging of L3 is new since 2016. No other acute bony abnormalities. IMPRESSION: 1. Two filling defects in the distal  common bile duct measuring 14 and 9 mm are consistent with choledocholithiasis resulting in intra and extrahepatic biliary duct dilatation and distention of the gallbladder. 2. 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 3. Anterior wedging of L3 is new since 2016. It is age indeterminate but I would favor the finding is nonacute. Recommend clinical  correlation. 4. Right adrenal adenoma. 5. Atherosclerotic changes in the nonaneurysmal aorta. Electronically Signed   By: Dorise Bullion III M.D   On: 09/25/2018 18:34   Dg Chest Portable 1 View  Result Date: 09/25/2018 CLINICAL DATA:  Cough and congestion. EXAM: PORTABLE CHEST 1 VIEW COMPARISON:  September 25, 2018 FINDINGS: Scoliotic curvature of the thoracic spine. Atelectasis in the left base. The heart, hila, mediastinum, lungs, and pleura are otherwise unchanged. IMPRESSION: No active disease. Electronically Signed   By: Dorise Bullion III M.D   On: 09/25/2018 18:52   Dg Chest Port 1 View  Result Date: 09/25/2018 CLINICAL DATA:  Altered mental status. EXAM: PORTABLE CHEST 1 VIEW COMPARISON:  Radiograph of October 19, 2016. FINDINGS: Stable cardiomegaly. Atherosclerosis of thoracic aorta is noted. Dextroscoliosis of thoracic spine is noted. No pneumothorax is noted. Lungs are clear. Bony thorax is unremarkable. IMPRESSION: No acute cardiopulmonary abnormality seen. Aortic Atherosclerosis (ICD10-I70.0). Electronically Signed   By: Marijo Conception, M.D.   On: 09/25/2018 14:50   Dg Ercp Biliary & Pancreatic Ducts  Result Date: 09/27/2018 CLINICAL DATA:  Bile duct stone removal EXAM: ERCP TECHNIQUE: Multiple spot images obtained with the fluoroscopic device and submitted for interpretation post-procedure. COMPARISON:  None. FINDINGS: 2 fluoroscopic spot images document endoscopic cannulation and opacification of the CBD with passage of a balloon tipped catheter. There is incomplete opacification of the intrahepatic biliary tree, which appears decompressed centrally. IMPRESSION: Endoscopic CBD cannulation and intervention. These images were submitted for radiologic interpretation only. Please see the procedural report for the amount of contrast and the fluoroscopy time utilized. Electronically Signed   By: Lucrezia Europe M.D.   On: 09/27/2018 09:25   Dg Swallowing Func-speech Pathology  Result Date:  09/29/2018 Objective Swallowing Evaluation: Type of Study: MBS-Modified Barium Swallow Study  Patient Details Name: Matthew Schultz MRN: [id number] Date of Birth: 1932/09/09 Today's Date: 09/29/2018 Time: SLP Start Time (ACUTE ONLY): 4010 -SLP Stop Time (ACUTE ONLY): 1520 SLP Time Calculation (min) (ACUTE ONLY): 16 min Past Medical History: Past Medical History: Diagnosis Date . Arthritis  . BPH (benign prostatic hyperplasia)  . CAD (coronary artery disease)  . DM (diabetes mellitus) ([place])  . HLD (hyperlipidemia)  . HTN (hypertension)  . Hydronephrosis  . Incomplete bladder emptying  . OA (osteoarthritis)  . Renal mass  . Stroke [facility])  Past Surgical History: Past Surgical History: Procedure Laterality Date . BACK SURGERY   HPI: [name] with h/o BPH, CAD, DM, HLD, HTN, OA, Renal mass, and CVA, who presented to [facility] with altered mental status and sepsis.  CT confirmed choledocholithiasis with two obstructing stones and signs of cholangitis.  He was transferred to [facility] and underwent ERCP 10/1.  CXR 9/29 without active disease.  Subjective: pt alert but minimally conversant, not following commands well Assessment / Plan / Recommendation CHL IP CLINICAL IMPRESSIONS 09/29/2018 Clinical Impression Pt presented with moderate oropharyngeal dysphagia marked by pharyngeal weakness evidenced by reduced hyolaryngeal excursion and decreased coordination/timing of swallow mechanisms. Mild lingual/palatal residue and moderate vallecular residue noted across thin and nectar POs. Pt also penetrated  thin bolus to the VF and aspirated without sensation, however volitional cough was effective to clear. While chin tuck strategy was effective in regards to its facilitation of complete airway closure with thin, pt's ability to execute commands to use the strategy was inconsistent and would therefore not be a reliable compensatory strategy. Full airway protection was present with nectar, puree, or regular textures, although prolonged  mastication and oral transit observed with puree and regular, and premature spill to the valleculae X1 noted with nectar. Given current presentation and dentition, recommend D3 diet with nectar thick liquids, use slow/small bites and sips in a minimally distracting environment, and ST will continue to provide treatment with diet safety and efficiency including opportunities for advancement.  SLP Visit Diagnosis Dysphagia, oropharyngeal phase (R13.12) Attention and concentration deficit following -- Frontal lobe and executive function deficit following -- Impact on safety and function Moderate aspiration risk   CHL IP TREATMENT RECOMMENDATION 09/29/2018 Treatment Recommendations Therapy as outlined in treatment plan below   Prognosis 09/29/2018 Prognosis for Safe Diet Advancement Good Barriers to Reach Goals Cognitive deficits Barriers/Prognosis Comment -- CHL IP DIET RECOMMENDATION 09/29/2018 SLP Diet Recommendations Dysphagia 3 (Mech soft) solids;Nectar thick liquid Liquid Administration via Cup;No straw Medication Administration Whole meds with puree Compensations Minimize environmental distractions;Slow rate;Small sips/bites Postural Changes Seated upright at 90 degrees   CHL IP OTHER RECOMMENDATIONS 09/29/2018 Recommended Consults -- Oral Care Recommendations Oral care BID Other Recommendations Order thickener from pharmacy   CHL IP FOLLOW UP RECOMMENDATIONS 09/29/2018 Follow up Recommendations Skilled Nursing facility   [facility] IP FREQUENCY AND DURATION 09/29/2018 Speech Therapy Frequency (ACUTE ONLY) min 2x/week Treatment Duration 2 weeks      CHL IP ORAL PHASE 09/29/2018 Oral Phase Impaired Oral - Pudding Teaspoon -- Oral - Pudding Cup -- Oral - Honey Teaspoon -- Oral - Honey Cup -- Oral - Nectar Teaspoon -- Oral - Nectar Cup Lingual/palatal residue;Premature spillage Oral - Nectar Straw -- Oral - Thin Teaspoon -- Oral - Thin Cup Holding of bolus Oral - Thin Straw -- Oral - Puree Lingual pumping;Delayed oral transit  Oral - Mech Soft -- Oral - Regular Delayed oral transit;Impaired mastication Oral - Multi-Consistency -- Oral - Pill -- Oral Phase - Comment --  CHL IP PHARYNGEAL PHASE 09/29/2018 Pharyngeal Phase Impaired Pharyngeal- Pudding Teaspoon -- Pharyngeal -- Pharyngeal- Pudding Cup -- Pharyngeal -- Pharyngeal- Honey Teaspoon -- Pharyngeal -- Pharyngeal- Honey Cup -- Pharyngeal -- Pharyngeal- Nectar Teaspoon -- Pharyngeal -- Pharyngeal- Nectar Cup Pharyngeal residue - valleculae;Pharyngeal residue - pyriform Pharyngeal -- Pharyngeal- Nectar Straw -- Pharyngeal -- Pharyngeal- Thin Teaspoon -- Pharyngeal -- Pharyngeal- Thin Cup Penetration/Aspiration during swallow;Compensatory strategies attempted (with notebox);Other (Comment) Pharyngeal Material enters airway, CONTACTS cords and not ejected out;Material enters airway, passes BELOW cords then ejected out Pharyngeal- Thin Straw -- Pharyngeal -- Pharyngeal- Puree WFL Pharyngeal -- Pharyngeal- Mechanical Soft -- Pharyngeal -- Pharyngeal- Regular WFL Pharyngeal -- Pharyngeal- Multi-consistency -- Pharyngeal -- Pharyngeal- Pill -- Pharyngeal -- Pharyngeal Comment --  CHL IP CERVICAL ESOPHAGEAL PHASE 09/29/2018 Cervical Esophageal Phase WFL Pudding Teaspoon -- Pudding Cup -- Honey Teaspoon -- Honey Cup -- Nectar Teaspoon -- Nectar Cup -- Nectar Straw -- Thin Teaspoon -- Thin Cup -- Thin Straw -- Puree -- Mechanical Soft -- Regular -- Multi-consistency -- Pill -- Cervical Esophageal Comment -- [place] Siren 09/29/2018, 4:47 PM Orbie Pyo Litaker M.Ed Actor Pager [phone] Office [phone]              US Abdomen Limited Ruq  Result Date: 09/25/2018 CLINICAL DATA:  Elevated LFTs EXAM: ULTRASOUND ABDOMEN LIMITED RIGHT UPPER QUADRANT COMPARISON:  None. FINDINGS: Gallbladder: Small amount of sludge. No cholelithiasis. No pericholecystic fluid. Thickened gallbladder wall measuring up to 4.5 mm. Negative sonographic Murphy sign. Common bile duct:  Diameter: 8 mm distally. No obstructing mass or choledocholithiasis. Liver: No focal lesion identified. Within normal limits in parenchymal echogenicity. Portal vein is patent on color Doppler imaging with normal direction of blood flow towards the liver. IMPRESSION: No cholelithiasis. Mild gallbladder wall thickening which may be reactive secondary to hepatocellular disease given the elevated LFTs. No sonographic evidence of acute cholecystitis. Electronically Signed   By: Kathreen Devoid   On: 09/25/2018 16:37     LOS: 9 days   Oren Binet, MD  [facility]  If 7PM-7AM, please contact night-coverage  Please page via [email or web address] [email or web address]-click on MD name and type text message  10/04/2018, 12:53 PM

## 2018-10-04 NOTE — Progress Notes (Signed)
  Speech Language Pathology Treatment: Dysphagia  Patient Details Name: Matthew Schultz MRN: [id number] DOB: 02-Nov-1932 Today's Date: 10/04/2018 Time: 4098-1191 SLP Time Calculation (min) (ACUTE ONLY): 20 min  Assessment / Plan / Recommendation Clinical Impression  Pt demonstrated increased confusion since last ST visit and was unable to answer questions regarding current dysphagia. He required mod-max verbal cues to follow basic commands and participate in dysphagia treatment session today. Of note, pt continues to present with baseline congested throat clear/cough and intermittent audible respirations. He demonstrated mild oral holding but no overt s/s aspiration throughout consumption of nectar thick and regular texture POs when provided mod verbal cues from the clinician. Given current status of mentation and dentition, recommend continue dysphagia 3 (mechanical soft), nectar thick liquids, no straws please. Pt should continue to receieve ST at his next venue of care to provide treatment with diet safety, efficiency, and to assess readiness for follow up instrumental testing to determine potential to uprgade liquid texture.    HPI HPI: [name] with h/o BPH, CAD, DM, HLD, HTN, OA, Renal mass, and CVA, who presented to [facility] with altered mental status and sepsis.  CT confirmed choledocholithiasis with two obstructing stones and signs of cholangitis.  He was transferred to [facility] and underwent ERCP 10/1.  CXR 9/29 without active disease.      SLP Plan  Continue with current plan of care       Recommendations  Diet recommendations: Dysphagia 3 (mechanical soft);Nectar-thick liquid Liquids provided via: Cup;No straw Medication Administration: Whole meds with puree Supervision: Staff to assist with self feeding;Intermittent supervision to cue for compensatory strategies Compensations: Minimize environmental distractions;Slow rate;Small sips/bites Postural Changes and/or Swallow  Maneuvers: Seated upright 90 degrees                Oral Care Recommendations: Oral care BID Follow up Recommendations: Skilled Nursing facility SLP Visit Diagnosis: Dysphagia, oral phase (R13.11) Plan: Continue with current plan of care       Matthew Schultz, Student SLP                Matthew Schultz 10/04/2018, 10:22 AM

## 2018-10-04 NOTE — Progress Notes (Signed)
CSW received notification from [facility] that patient has been denied by [facility] and they are requesting a peer to peer with hospital MD. Peer to peer set up with Juliann Pulse [phone] ex [phone]) and [employer] MD will contact Dr. Sloan Leiter by end of business day tomorrow.   Percell Locus Santiago Stenzel LCSW [phone]

## 2018-10-04 NOTE — Progress Notes (Signed)
Modified Barium Swallow Progress Note  Patient Details  Name: Matthew Schultz MRN: [id number] Date of Birth: 04-05-1932  Today's Date: 10/04/2018  Modified Barium Swallow completed.  Full report located under Chart Review in the Imaging Section.  Brief recommendations include the following:  Clinical Impression    Pt presented with moderate oropharyngeal dysphagia marked by pharyngeal weakness evidenced by reduced hyolaryngeal excursion and decreased coordination/timing of swallow mechanisms. Mild lingual/palatal residue and moderate vallecular residue noted across thin and nectar POs. Pt also penetrated thin bolus to the VF and aspirated without sensation, however volitional cough was effective to clear. While chin tuck strategy was effective in regards to its facilitation of complete airway closure with thin, pt's ability to execute commands to use the strategy was inconsistent and would therefore not be a reliable compensatory strategy. Full airway protection was present with nectar, puree, or regular textures, although prolonged mastication and oral transit observed with puree and regular, and premature spill to the valleculae X1 noted with nectar. Given current presentation and dentition, recommend D3 diet with nectar thick liquids, use slow/small bites and sips in a minimally distracting environment, and ST will continue to provide treatment with diet safety and efficiency including opportunities for advancement.       Swallow Evaluation Recommendations:   Dys 3, nectar                                     Bettey Muraoka, Orbie Pyo 10/04/2018,7:57 AM   Orbie Pyo Colvin Caroli.Ed Risk analyst [phone] Office [phone]

## 2018-10-05 DIAGNOSIS — R531 Weakness: Secondary | ICD-10-CM | POA: Diagnosis not present

## 2018-10-05 DIAGNOSIS — Z7982 Long term (current) use of aspirin: Secondary | ICD-10-CM | POA: Diagnosis not present

## 2018-10-05 DIAGNOSIS — R1313 Dysphagia, pharyngeal phase: Secondary | ICD-10-CM | POA: Diagnosis not present

## 2018-10-05 DIAGNOSIS — K8030 Calculus of bile duct with cholangitis, unspecified, without obstruction: Secondary | ICD-10-CM | POA: Diagnosis not present

## 2018-10-05 DIAGNOSIS — E119 Type 2 diabetes mellitus without complications: Secondary | ICD-10-CM | POA: Diagnosis not present

## 2018-10-05 DIAGNOSIS — R1310 Dysphagia, unspecified: Secondary | ICD-10-CM | POA: Diagnosis not present

## 2018-10-05 DIAGNOSIS — I10 Essential (primary) hypertension: Secondary | ICD-10-CM | POA: Diagnosis not present

## 2018-10-05 DIAGNOSIS — D3501 Benign neoplasm of right adrenal gland: Secondary | ICD-10-CM | POA: Diagnosis not present

## 2018-10-05 DIAGNOSIS — E440 Moderate protein-calorie malnutrition: Secondary | ICD-10-CM | POA: Diagnosis not present

## 2018-10-05 DIAGNOSIS — K8309 Other cholangitis: Secondary | ICD-10-CM | POA: Diagnosis not present

## 2018-10-05 DIAGNOSIS — F0390 Unspecified dementia without behavioral disturbance: Secondary | ICD-10-CM | POA: Diagnosis not present

## 2018-10-05 DIAGNOSIS — M2550 Pain in unspecified joint: Secondary | ICD-10-CM | POA: Diagnosis not present

## 2018-10-05 DIAGNOSIS — N400 Enlarged prostate without lower urinary tract symptoms: Secondary | ICD-10-CM | POA: Diagnosis not present

## 2018-10-05 DIAGNOSIS — I2510 Atherosclerotic heart disease of native coronary artery without angina pectoris: Secondary | ICD-10-CM | POA: Diagnosis not present

## 2018-10-05 DIAGNOSIS — E1140 Type 2 diabetes mellitus with diabetic neuropathy, unspecified: Secondary | ICD-10-CM | POA: Diagnosis not present

## 2018-10-05 DIAGNOSIS — Z7401 Bed confinement status: Secondary | ICD-10-CM | POA: Diagnosis not present

## 2018-10-05 DIAGNOSIS — E1159 Type 2 diabetes mellitus with other circulatory complications: Secondary | ICD-10-CM | POA: Diagnosis not present

## 2018-10-05 DIAGNOSIS — G459 Transient cerebral ischemic attack, unspecified: Secondary | ICD-10-CM | POA: Diagnosis not present

## 2018-10-05 LAB — GLUCOSE, CAPILLARY
GLUCOSE-CAPILLARY: 148 mg/dL — AB (ref 70–99)
GLUCOSE-CAPILLARY: 123 mg/dL — AB (ref 70–99)
Glucose-Capillary: 135 mg/dL — ABNORMAL HIGH (ref 70–99)

## 2018-10-05 LAB — BASIC METABOLIC PANEL
ANION GAP: 8 (ref 5–15)
BUN: 24 mg/dL — ABNORMAL HIGH (ref 8–23)
CHLORIDE: 103 mmol/L (ref 98–111)
CO2: 21 mmol/L — AB (ref 22–32)
Calcium: 8.7 mg/dL — ABNORMAL LOW (ref 8.9–10.3)
Creatinine, Ser: 0.92 mg/dL (ref 0.61–1.24)
GFR calc Af Amer: >60 mL/min (ref >60)
GFR calc non Af Amer: >60 mL/min (ref >60)
GLUCOSE: 146 mg/dL — AB (ref 70–99)
Potassium: 4.8 mmol/L (ref 3.5–5.1)
Sodium: 132 mmol/L — ABNORMAL LOW (ref 135–145)

## 2018-10-05 LAB — CBC
HEMATOCRIT: 44.6 % (ref 39.0–52.0)
HEMOGLOBIN: 14.6 g/dL (ref 13.0–17.0)
MCH: 29.3 pg (ref 26.0–34.0)
MCHC: 32.7 g/dL (ref 30.0–36.0)
MCV: 89.6 fL (ref 80.0–100.0)
NRBC: 0 % (ref 0.0–0.2)
Platelets: 258 10*3/uL (ref 150–400)
RBC: 4.98 MIL/uL (ref 4.22–5.81)
RDW: 13.6 % (ref 11.5–15.5)
WBC: 15.3 10*3/uL — ABNORMAL HIGH (ref 4.0–10.5)

## 2018-10-05 NOTE — Progress Notes (Signed)
Seen and examined-pleasantly confused this morning.  No major issues overnight per nursing staff.  Stable for discharge today-I had a peer to peer with insurance MD-denial has been overturned-he will be discharged to SNF today.

## 2018-10-05 NOTE — Progress Notes (Signed)
[facility] has received approval for patient to discharge there today.   Percell Locus Yeraldy Spike LCSW [phone]

## 2018-10-05 NOTE — Progress Notes (Signed)
Pt discharged with [facility].  Pt is hemodynamically stable. Paperwork given to transport. Discharge plan appropriate and in place.

## 2018-10-05 NOTE — Clinical Social Work Placement (Signed)
   CLINICAL SOCIAL WORK PLACEMENT  NOTE  Date:  10/05/2018  Patient Details  Name: Matthew Schultz MRN: [id number] Date of Birth: 11/30/1932  Clinical Social Work is seeking post-discharge placement for this patient at the [place] level of care (*CSW will initial, date and re-position this form in  chart as items are completed):  Yes   Patient/family provided with [place] Work Department's list of facilities offering this level of care within the geographic area requested by the patient (or if unable, by the patient's family).  Yes   Patient/family informed of their freedom to choose among providers that offer the needed level of care, that participate in Medicare, Medicaid or managed care program needed by the patient, have an available bed and are willing to accept the patient.  Yes   Patient/family informed of [place]'s ownership interest in [facility] and [facility], as well as of the fact that they are under no obligation to receive care at these facilities.  PASRR submitted to [employer]DS on 09/29/18     PASRR number received on 09/29/18     Existing PASRR number confirmed on       FL2 transmitted to all facilities in geographic area requested by pt/family on 09/29/18     FL2 transmitted to all facilities within larger geographic area on       Patient informed that his/her managed care company has contracts with or will negotiate with certain facilities, including the following:        Yes   Patient/family informed of bed offers received.  Patient chooses bed at [facility]     Physician recommends and patient chooses bed at      Patient to be transferred to [facility] on 10/05/18.  Patient to be transferred to facility by [facility]AR     Patient family notified on 10/05/18 of transfer.  Name of family member notified:  Daughter, [name]     PHYSICIAN       Additional Comment:     _______________________________________________ Benard Halsted, LCSW 10/05/2018, 3:31 PM

## 2018-10-05 NOTE — Progress Notes (Signed)
Patient will DC to: [facility] Anticipated DC date: 10/05/18 Family notified: Daughter, Research officer, political party by: Sharl Ma   Per MD patient ready for DC to [facility]. RN, patient, patient's family, and facility notified of DC. Discharge Summary and FL2 sent to facility. RN to call report prior to discharge [phone]). DC packet on chart. Ambulance transport requested for patient.   CSW will sign off for now as social work intervention is no longer needed. Please consult [place] again if new needs arise.  Cedric Fishman, LCSW Clinical Social Worker [phone]

## 2018-10-06 DIAGNOSIS — R1313 Dysphagia, pharyngeal phase: Secondary | ICD-10-CM | POA: Diagnosis not present

## 2018-10-06 DIAGNOSIS — E1159 Type 2 diabetes mellitus with other circulatory complications: Secondary | ICD-10-CM | POA: Diagnosis not present

## 2018-10-06 DIAGNOSIS — I2510 Atherosclerotic heart disease of native coronary artery without angina pectoris: Secondary | ICD-10-CM | POA: Diagnosis not present

## 2018-10-06 DIAGNOSIS — F0390 Unspecified dementia without behavioral disturbance: Secondary | ICD-10-CM | POA: Diagnosis not present

## 2018-10-06 DIAGNOSIS — E440 Moderate protein-calorie malnutrition: Secondary | ICD-10-CM | POA: Diagnosis not present

## 2018-10-10 DIAGNOSIS — K8309 Other cholangitis: Secondary | ICD-10-CM | POA: Diagnosis not present

## 2018-10-10 DIAGNOSIS — N400 Enlarged prostate without lower urinary tract symptoms: Secondary | ICD-10-CM | POA: Diagnosis not present

## 2018-10-10 DIAGNOSIS — E119 Type 2 diabetes mellitus without complications: Secondary | ICD-10-CM | POA: Diagnosis not present

## 2018-10-10 DIAGNOSIS — F0390 Unspecified dementia without behavioral disturbance: Secondary | ICD-10-CM | POA: Diagnosis not present

## 2018-10-20 DIAGNOSIS — K8309 Other cholangitis: Secondary | ICD-10-CM | POA: Diagnosis not present

## 2018-10-20 DIAGNOSIS — E119 Type 2 diabetes mellitus without complications: Secondary | ICD-10-CM | POA: Diagnosis not present

## 2018-10-20 DIAGNOSIS — N400 Enlarged prostate without lower urinary tract symptoms: Secondary | ICD-10-CM | POA: Diagnosis not present

## 2018-10-20 DIAGNOSIS — F0390 Unspecified dementia without behavioral disturbance: Secondary | ICD-10-CM | POA: Diagnosis not present

## 2018-10-24 DIAGNOSIS — N400 Enlarged prostate without lower urinary tract symptoms: Secondary | ICD-10-CM | POA: Diagnosis not present

## 2018-10-24 DIAGNOSIS — F0390 Unspecified dementia without behavioral disturbance: Secondary | ICD-10-CM | POA: Diagnosis not present

## 2018-10-24 DIAGNOSIS — M1990 Unspecified osteoarthritis, unspecified site: Secondary | ICD-10-CM | POA: Diagnosis not present

## 2018-10-24 DIAGNOSIS — K8030 Calculus of bile duct with cholangitis, unspecified, without obstruction: Secondary | ICD-10-CM | POA: Diagnosis not present

## 2018-10-24 DIAGNOSIS — E1140 Type 2 diabetes mellitus with diabetic neuropathy, unspecified: Secondary | ICD-10-CM | POA: Diagnosis not present

## 2018-10-24 DIAGNOSIS — M5116 Intervertebral disc disorders with radiculopathy, lumbar region: Secondary | ICD-10-CM | POA: Diagnosis not present

## 2018-10-24 DIAGNOSIS — I2510 Atherosclerotic heart disease of native coronary artery without angina pectoris: Secondary | ICD-10-CM | POA: Diagnosis not present

## 2018-10-24 DIAGNOSIS — I10 Essential (primary) hypertension: Secondary | ICD-10-CM | POA: Diagnosis not present

## 2018-10-24 DIAGNOSIS — D3501 Benign neoplasm of right adrenal gland: Secondary | ICD-10-CM | POA: Diagnosis not present

## 2018-10-25 DIAGNOSIS — D3501 Benign neoplasm of right adrenal gland: Secondary | ICD-10-CM | POA: Diagnosis not present

## 2018-10-25 DIAGNOSIS — F0390 Unspecified dementia without behavioral disturbance: Secondary | ICD-10-CM | POA: Diagnosis not present

## 2018-10-25 DIAGNOSIS — M5116 Intervertebral disc disorders with radiculopathy, lumbar region: Secondary | ICD-10-CM | POA: Diagnosis not present

## 2018-10-25 DIAGNOSIS — M1990 Unspecified osteoarthritis, unspecified site: Secondary | ICD-10-CM | POA: Diagnosis not present

## 2018-10-25 DIAGNOSIS — K8030 Calculus of bile duct with cholangitis, unspecified, without obstruction: Secondary | ICD-10-CM | POA: Diagnosis not present

## 2018-10-25 DIAGNOSIS — I10 Essential (primary) hypertension: Secondary | ICD-10-CM | POA: Diagnosis not present

## 2018-10-25 DIAGNOSIS — I2510 Atherosclerotic heart disease of native coronary artery without angina pectoris: Secondary | ICD-10-CM | POA: Diagnosis not present

## 2018-10-25 DIAGNOSIS — E1140 Type 2 diabetes mellitus with diabetic neuropathy, unspecified: Secondary | ICD-10-CM | POA: Diagnosis not present

## 2018-10-25 DIAGNOSIS — N400 Enlarged prostate without lower urinary tract symptoms: Secondary | ICD-10-CM | POA: Diagnosis not present

## 2018-10-28 DIAGNOSIS — I2510 Atherosclerotic heart disease of native coronary artery without angina pectoris: Secondary | ICD-10-CM | POA: Diagnosis not present

## 2018-10-28 DIAGNOSIS — E1140 Type 2 diabetes mellitus with diabetic neuropathy, unspecified: Secondary | ICD-10-CM | POA: Diagnosis not present

## 2018-10-28 DIAGNOSIS — D3501 Benign neoplasm of right adrenal gland: Secondary | ICD-10-CM | POA: Diagnosis not present

## 2018-10-28 DIAGNOSIS — M5116 Intervertebral disc disorders with radiculopathy, lumbar region: Secondary | ICD-10-CM | POA: Diagnosis not present

## 2018-10-28 DIAGNOSIS — F0390 Unspecified dementia without behavioral disturbance: Secondary | ICD-10-CM | POA: Diagnosis not present

## 2018-10-28 DIAGNOSIS — I10 Essential (primary) hypertension: Secondary | ICD-10-CM | POA: Diagnosis not present

## 2018-10-28 DIAGNOSIS — M1990 Unspecified osteoarthritis, unspecified site: Secondary | ICD-10-CM | POA: Diagnosis not present

## 2018-10-28 DIAGNOSIS — N400 Enlarged prostate without lower urinary tract symptoms: Secondary | ICD-10-CM | POA: Diagnosis not present

## 2018-10-28 DIAGNOSIS — K8030 Calculus of bile duct with cholangitis, unspecified, without obstruction: Secondary | ICD-10-CM | POA: Diagnosis not present

## 2018-11-01 DIAGNOSIS — I10 Essential (primary) hypertension: Secondary | ICD-10-CM | POA: Diagnosis not present

## 2018-11-01 DIAGNOSIS — E119 Type 2 diabetes mellitus without complications: Secondary | ICD-10-CM | POA: Diagnosis not present

## 2018-11-03 DIAGNOSIS — N400 Enlarged prostate without lower urinary tract symptoms: Secondary | ICD-10-CM | POA: Diagnosis not present

## 2018-11-03 DIAGNOSIS — I10 Essential (primary) hypertension: Secondary | ICD-10-CM | POA: Diagnosis not present

## 2018-11-03 DIAGNOSIS — F0390 Unspecified dementia without behavioral disturbance: Secondary | ICD-10-CM | POA: Diagnosis not present

## 2018-11-03 DIAGNOSIS — K8030 Calculus of bile duct with cholangitis, unspecified, without obstruction: Secondary | ICD-10-CM | POA: Diagnosis not present

## 2018-11-03 DIAGNOSIS — D3501 Benign neoplasm of right adrenal gland: Secondary | ICD-10-CM | POA: Diagnosis not present

## 2018-11-03 DIAGNOSIS — M1990 Unspecified osteoarthritis, unspecified site: Secondary | ICD-10-CM | POA: Diagnosis not present

## 2018-11-03 DIAGNOSIS — I2510 Atherosclerotic heart disease of native coronary artery without angina pectoris: Secondary | ICD-10-CM | POA: Diagnosis not present

## 2018-11-03 DIAGNOSIS — E1140 Type 2 diabetes mellitus with diabetic neuropathy, unspecified: Secondary | ICD-10-CM | POA: Diagnosis not present

## 2018-11-03 DIAGNOSIS — M5116 Intervertebral disc disorders with radiculopathy, lumbar region: Secondary | ICD-10-CM | POA: Diagnosis not present

## 2018-11-04 DIAGNOSIS — M1990 Unspecified osteoarthritis, unspecified site: Secondary | ICD-10-CM | POA: Diagnosis not present

## 2018-11-04 DIAGNOSIS — K8030 Calculus of bile duct with cholangitis, unspecified, without obstruction: Secondary | ICD-10-CM | POA: Diagnosis not present

## 2018-11-04 DIAGNOSIS — I2510 Atherosclerotic heart disease of native coronary artery without angina pectoris: Secondary | ICD-10-CM | POA: Diagnosis not present

## 2018-11-04 DIAGNOSIS — I10 Essential (primary) hypertension: Secondary | ICD-10-CM | POA: Diagnosis not present

## 2018-11-04 DIAGNOSIS — N400 Enlarged prostate without lower urinary tract symptoms: Secondary | ICD-10-CM | POA: Diagnosis not present

## 2018-11-04 DIAGNOSIS — F0390 Unspecified dementia without behavioral disturbance: Secondary | ICD-10-CM | POA: Diagnosis not present

## 2018-11-04 DIAGNOSIS — E1140 Type 2 diabetes mellitus with diabetic neuropathy, unspecified: Secondary | ICD-10-CM | POA: Diagnosis not present

## 2018-11-04 DIAGNOSIS — D3501 Benign neoplasm of right adrenal gland: Secondary | ICD-10-CM | POA: Diagnosis not present

## 2018-11-04 DIAGNOSIS — M5116 Intervertebral disc disorders with radiculopathy, lumbar region: Secondary | ICD-10-CM | POA: Diagnosis not present

## 2018-11-07 DIAGNOSIS — N400 Enlarged prostate without lower urinary tract symptoms: Secondary | ICD-10-CM | POA: Diagnosis not present

## 2018-11-07 DIAGNOSIS — I2510 Atherosclerotic heart disease of native coronary artery without angina pectoris: Secondary | ICD-10-CM | POA: Diagnosis not present

## 2018-11-07 DIAGNOSIS — E1140 Type 2 diabetes mellitus with diabetic neuropathy, unspecified: Secondary | ICD-10-CM | POA: Diagnosis not present

## 2018-11-07 DIAGNOSIS — F0390 Unspecified dementia without behavioral disturbance: Secondary | ICD-10-CM | POA: Diagnosis not present

## 2018-11-07 DIAGNOSIS — K8030 Calculus of bile duct with cholangitis, unspecified, without obstruction: Secondary | ICD-10-CM | POA: Diagnosis not present

## 2018-11-07 DIAGNOSIS — M5116 Intervertebral disc disorders with radiculopathy, lumbar region: Secondary | ICD-10-CM | POA: Diagnosis not present

## 2018-11-07 DIAGNOSIS — I10 Essential (primary) hypertension: Secondary | ICD-10-CM | POA: Diagnosis not present

## 2018-11-07 DIAGNOSIS — D3501 Benign neoplasm of right adrenal gland: Secondary | ICD-10-CM | POA: Diagnosis not present

## 2018-11-07 DIAGNOSIS — M1990 Unspecified osteoarthritis, unspecified site: Secondary | ICD-10-CM | POA: Diagnosis not present

## 2018-11-09 DIAGNOSIS — I2510 Atherosclerotic heart disease of native coronary artery without angina pectoris: Secondary | ICD-10-CM | POA: Diagnosis not present

## 2018-11-09 DIAGNOSIS — M1990 Unspecified osteoarthritis, unspecified site: Secondary | ICD-10-CM | POA: Diagnosis not present

## 2018-11-09 DIAGNOSIS — N400 Enlarged prostate without lower urinary tract symptoms: Secondary | ICD-10-CM | POA: Diagnosis not present

## 2018-11-09 DIAGNOSIS — M5116 Intervertebral disc disorders with radiculopathy, lumbar region: Secondary | ICD-10-CM | POA: Diagnosis not present

## 2018-11-09 DIAGNOSIS — I10 Essential (primary) hypertension: Secondary | ICD-10-CM | POA: Diagnosis not present

## 2018-11-09 DIAGNOSIS — D3501 Benign neoplasm of right adrenal gland: Secondary | ICD-10-CM | POA: Diagnosis not present

## 2018-11-09 DIAGNOSIS — F0390 Unspecified dementia without behavioral disturbance: Secondary | ICD-10-CM | POA: Diagnosis not present

## 2018-11-09 DIAGNOSIS — K8030 Calculus of bile duct with cholangitis, unspecified, without obstruction: Secondary | ICD-10-CM | POA: Diagnosis not present

## 2018-11-09 DIAGNOSIS — E1140 Type 2 diabetes mellitus with diabetic neuropathy, unspecified: Secondary | ICD-10-CM | POA: Diagnosis not present

## 2018-11-11 DIAGNOSIS — N400 Enlarged prostate without lower urinary tract symptoms: Secondary | ICD-10-CM | POA: Diagnosis not present

## 2018-11-11 DIAGNOSIS — M1990 Unspecified osteoarthritis, unspecified site: Secondary | ICD-10-CM | POA: Diagnosis not present

## 2018-11-11 DIAGNOSIS — F0390 Unspecified dementia without behavioral disturbance: Secondary | ICD-10-CM | POA: Diagnosis not present

## 2018-11-11 DIAGNOSIS — D3501 Benign neoplasm of right adrenal gland: Secondary | ICD-10-CM | POA: Diagnosis not present

## 2018-11-11 DIAGNOSIS — E1140 Type 2 diabetes mellitus with diabetic neuropathy, unspecified: Secondary | ICD-10-CM | POA: Diagnosis not present

## 2018-11-11 DIAGNOSIS — I10 Essential (primary) hypertension: Secondary | ICD-10-CM | POA: Diagnosis not present

## 2018-11-11 DIAGNOSIS — K8030 Calculus of bile duct with cholangitis, unspecified, without obstruction: Secondary | ICD-10-CM | POA: Diagnosis not present

## 2018-11-11 DIAGNOSIS — M5116 Intervertebral disc disorders with radiculopathy, lumbar region: Secondary | ICD-10-CM | POA: Diagnosis not present

## 2018-11-11 DIAGNOSIS — I2510 Atherosclerotic heart disease of native coronary artery without angina pectoris: Secondary | ICD-10-CM | POA: Diagnosis not present

## 2018-11-15 DIAGNOSIS — M5116 Intervertebral disc disorders with radiculopathy, lumbar region: Secondary | ICD-10-CM | POA: Diagnosis not present

## 2018-11-15 DIAGNOSIS — N400 Enlarged prostate without lower urinary tract symptoms: Secondary | ICD-10-CM | POA: Diagnosis not present

## 2018-11-15 DIAGNOSIS — K8030 Calculus of bile duct with cholangitis, unspecified, without obstruction: Secondary | ICD-10-CM | POA: Diagnosis not present

## 2018-11-15 DIAGNOSIS — I10 Essential (primary) hypertension: Secondary | ICD-10-CM | POA: Diagnosis not present

## 2018-11-15 DIAGNOSIS — I2510 Atherosclerotic heart disease of native coronary artery without angina pectoris: Secondary | ICD-10-CM | POA: Diagnosis not present

## 2018-11-15 DIAGNOSIS — M1990 Unspecified osteoarthritis, unspecified site: Secondary | ICD-10-CM | POA: Diagnosis not present

## 2018-11-15 DIAGNOSIS — D3501 Benign neoplasm of right adrenal gland: Secondary | ICD-10-CM | POA: Diagnosis not present

## 2018-11-15 DIAGNOSIS — F0390 Unspecified dementia without behavioral disturbance: Secondary | ICD-10-CM | POA: Diagnosis not present

## 2018-11-15 DIAGNOSIS — E1140 Type 2 diabetes mellitus with diabetic neuropathy, unspecified: Secondary | ICD-10-CM | POA: Diagnosis not present

## 2018-11-17 DIAGNOSIS — I2510 Atherosclerotic heart disease of native coronary artery without angina pectoris: Secondary | ICD-10-CM | POA: Diagnosis not present

## 2018-11-17 DIAGNOSIS — K8030 Calculus of bile duct with cholangitis, unspecified, without obstruction: Secondary | ICD-10-CM | POA: Diagnosis not present

## 2018-11-17 DIAGNOSIS — F0390 Unspecified dementia without behavioral disturbance: Secondary | ICD-10-CM | POA: Diagnosis not present

## 2018-11-17 DIAGNOSIS — E1140 Type 2 diabetes mellitus with diabetic neuropathy, unspecified: Secondary | ICD-10-CM | POA: Diagnosis not present

## 2018-11-17 DIAGNOSIS — I10 Essential (primary) hypertension: Secondary | ICD-10-CM | POA: Diagnosis not present

## 2018-11-17 DIAGNOSIS — D3501 Benign neoplasm of right adrenal gland: Secondary | ICD-10-CM | POA: Diagnosis not present

## 2018-11-17 DIAGNOSIS — N400 Enlarged prostate without lower urinary tract symptoms: Secondary | ICD-10-CM | POA: Diagnosis not present

## 2018-11-17 DIAGNOSIS — M5116 Intervertebral disc disorders with radiculopathy, lumbar region: Secondary | ICD-10-CM | POA: Diagnosis not present

## 2018-11-17 DIAGNOSIS — M1990 Unspecified osteoarthritis, unspecified site: Secondary | ICD-10-CM | POA: Diagnosis not present

## 2018-11-21 DIAGNOSIS — M1990 Unspecified osteoarthritis, unspecified site: Secondary | ICD-10-CM | POA: Diagnosis not present

## 2018-11-21 DIAGNOSIS — I10 Essential (primary) hypertension: Secondary | ICD-10-CM | POA: Diagnosis not present

## 2018-11-21 DIAGNOSIS — I2510 Atherosclerotic heart disease of native coronary artery without angina pectoris: Secondary | ICD-10-CM | POA: Diagnosis not present

## 2018-11-21 DIAGNOSIS — M5116 Intervertebral disc disorders with radiculopathy, lumbar region: Secondary | ICD-10-CM | POA: Diagnosis not present

## 2018-11-21 DIAGNOSIS — F0390 Unspecified dementia without behavioral disturbance: Secondary | ICD-10-CM | POA: Diagnosis not present

## 2018-11-21 DIAGNOSIS — D3501 Benign neoplasm of right adrenal gland: Secondary | ICD-10-CM | POA: Diagnosis not present

## 2018-11-21 DIAGNOSIS — K8030 Calculus of bile duct with cholangitis, unspecified, without obstruction: Secondary | ICD-10-CM | POA: Diagnosis not present

## 2018-11-21 DIAGNOSIS — E1140 Type 2 diabetes mellitus with diabetic neuropathy, unspecified: Secondary | ICD-10-CM | POA: Diagnosis not present

## 2018-11-21 DIAGNOSIS — N400 Enlarged prostate without lower urinary tract symptoms: Secondary | ICD-10-CM | POA: Diagnosis not present

## 2018-11-22 DIAGNOSIS — E1140 Type 2 diabetes mellitus with diabetic neuropathy, unspecified: Secondary | ICD-10-CM | POA: Diagnosis not present

## 2018-11-22 DIAGNOSIS — N400 Enlarged prostate without lower urinary tract symptoms: Secondary | ICD-10-CM | POA: Diagnosis not present

## 2018-11-22 DIAGNOSIS — J069 Acute upper respiratory infection, unspecified: Secondary | ICD-10-CM | POA: Diagnosis not present

## 2018-11-22 DIAGNOSIS — I2510 Atherosclerotic heart disease of native coronary artery without angina pectoris: Secondary | ICD-10-CM | POA: Diagnosis not present

## 2018-11-22 DIAGNOSIS — M5116 Intervertebral disc disorders with radiculopathy, lumbar region: Secondary | ICD-10-CM | POA: Diagnosis not present

## 2018-11-22 DIAGNOSIS — D3501 Benign neoplasm of right adrenal gland: Secondary | ICD-10-CM | POA: Diagnosis not present

## 2018-11-22 DIAGNOSIS — M1990 Unspecified osteoarthritis, unspecified site: Secondary | ICD-10-CM | POA: Diagnosis not present

## 2018-11-22 DIAGNOSIS — K8030 Calculus of bile duct with cholangitis, unspecified, without obstruction: Secondary | ICD-10-CM | POA: Diagnosis not present

## 2018-11-22 DIAGNOSIS — I10 Essential (primary) hypertension: Secondary | ICD-10-CM | POA: Diagnosis not present

## 2018-11-22 DIAGNOSIS — F0390 Unspecified dementia without behavioral disturbance: Secondary | ICD-10-CM | POA: Diagnosis not present

## 2018-11-24 DIAGNOSIS — N400 Enlarged prostate without lower urinary tract symptoms: Secondary | ICD-10-CM | POA: Diagnosis not present

## 2018-11-24 DIAGNOSIS — M5116 Intervertebral disc disorders with radiculopathy, lumbar region: Secondary | ICD-10-CM | POA: Diagnosis not present

## 2018-11-24 DIAGNOSIS — F0390 Unspecified dementia without behavioral disturbance: Secondary | ICD-10-CM | POA: Diagnosis not present

## 2018-11-24 DIAGNOSIS — M1990 Unspecified osteoarthritis, unspecified site: Secondary | ICD-10-CM | POA: Diagnosis not present

## 2018-11-24 DIAGNOSIS — E1140 Type 2 diabetes mellitus with diabetic neuropathy, unspecified: Secondary | ICD-10-CM | POA: Diagnosis not present

## 2018-11-24 DIAGNOSIS — D3501 Benign neoplasm of right adrenal gland: Secondary | ICD-10-CM | POA: Diagnosis not present

## 2018-11-24 DIAGNOSIS — K8030 Calculus of bile duct with cholangitis, unspecified, without obstruction: Secondary | ICD-10-CM | POA: Diagnosis not present

## 2018-11-24 DIAGNOSIS — I2510 Atherosclerotic heart disease of native coronary artery without angina pectoris: Secondary | ICD-10-CM | POA: Diagnosis not present

## 2018-11-24 DIAGNOSIS — I10 Essential (primary) hypertension: Secondary | ICD-10-CM | POA: Diagnosis not present

## 2018-11-28 DIAGNOSIS — I2510 Atherosclerotic heart disease of native coronary artery without angina pectoris: Secondary | ICD-10-CM | POA: Diagnosis not present

## 2018-11-28 DIAGNOSIS — K8030 Calculus of bile duct with cholangitis, unspecified, without obstruction: Secondary | ICD-10-CM | POA: Diagnosis not present

## 2018-11-28 DIAGNOSIS — E1140 Type 2 diabetes mellitus with diabetic neuropathy, unspecified: Secondary | ICD-10-CM | POA: Diagnosis not present

## 2018-11-28 DIAGNOSIS — D3501 Benign neoplasm of right adrenal gland: Secondary | ICD-10-CM | POA: Diagnosis not present

## 2018-11-28 DIAGNOSIS — I10 Essential (primary) hypertension: Secondary | ICD-10-CM | POA: Diagnosis not present

## 2018-11-28 DIAGNOSIS — M1990 Unspecified osteoarthritis, unspecified site: Secondary | ICD-10-CM | POA: Diagnosis not present

## 2018-11-28 DIAGNOSIS — N400 Enlarged prostate without lower urinary tract symptoms: Secondary | ICD-10-CM | POA: Diagnosis not present

## 2018-11-28 DIAGNOSIS — M5116 Intervertebral disc disorders with radiculopathy, lumbar region: Secondary | ICD-10-CM | POA: Diagnosis not present

## 2018-11-28 DIAGNOSIS — F0390 Unspecified dementia without behavioral disturbance: Secondary | ICD-10-CM | POA: Diagnosis not present

## 2018-12-02 DIAGNOSIS — I2510 Atherosclerotic heart disease of native coronary artery without angina pectoris: Secondary | ICD-10-CM | POA: Diagnosis not present

## 2018-12-02 DIAGNOSIS — M5116 Intervertebral disc disorders with radiculopathy, lumbar region: Secondary | ICD-10-CM | POA: Diagnosis not present

## 2018-12-02 DIAGNOSIS — F0390 Unspecified dementia without behavioral disturbance: Secondary | ICD-10-CM | POA: Diagnosis not present

## 2018-12-02 DIAGNOSIS — M1990 Unspecified osteoarthritis, unspecified site: Secondary | ICD-10-CM | POA: Diagnosis not present

## 2018-12-02 DIAGNOSIS — E1140 Type 2 diabetes mellitus with diabetic neuropathy, unspecified: Secondary | ICD-10-CM | POA: Diagnosis not present

## 2018-12-02 DIAGNOSIS — I10 Essential (primary) hypertension: Secondary | ICD-10-CM | POA: Diagnosis not present

## 2018-12-02 DIAGNOSIS — N400 Enlarged prostate without lower urinary tract symptoms: Secondary | ICD-10-CM | POA: Diagnosis not present

## 2018-12-02 DIAGNOSIS — K8030 Calculus of bile duct with cholangitis, unspecified, without obstruction: Secondary | ICD-10-CM | POA: Diagnosis not present

## 2018-12-02 DIAGNOSIS — D3501 Benign neoplasm of right adrenal gland: Secondary | ICD-10-CM | POA: Diagnosis not present

## 2018-12-05 DIAGNOSIS — F0390 Unspecified dementia without behavioral disturbance: Secondary | ICD-10-CM | POA: Diagnosis not present

## 2018-12-05 DIAGNOSIS — E1140 Type 2 diabetes mellitus with diabetic neuropathy, unspecified: Secondary | ICD-10-CM | POA: Diagnosis not present

## 2018-12-05 DIAGNOSIS — I2510 Atherosclerotic heart disease of native coronary artery without angina pectoris: Secondary | ICD-10-CM | POA: Diagnosis not present

## 2018-12-05 DIAGNOSIS — M5116 Intervertebral disc disorders with radiculopathy, lumbar region: Secondary | ICD-10-CM | POA: Diagnosis not present

## 2018-12-05 DIAGNOSIS — I10 Essential (primary) hypertension: Secondary | ICD-10-CM | POA: Diagnosis not present

## 2018-12-05 DIAGNOSIS — K8030 Calculus of bile duct with cholangitis, unspecified, without obstruction: Secondary | ICD-10-CM | POA: Diagnosis not present

## 2018-12-05 DIAGNOSIS — D3501 Benign neoplasm of right adrenal gland: Secondary | ICD-10-CM | POA: Diagnosis not present

## 2018-12-05 DIAGNOSIS — N400 Enlarged prostate without lower urinary tract symptoms: Secondary | ICD-10-CM | POA: Diagnosis not present

## 2018-12-05 DIAGNOSIS — M1990 Unspecified osteoarthritis, unspecified site: Secondary | ICD-10-CM | POA: Diagnosis not present

## 2018-12-08 DIAGNOSIS — I10 Essential (primary) hypertension: Secondary | ICD-10-CM | POA: Diagnosis not present

## 2018-12-08 DIAGNOSIS — M5116 Intervertebral disc disorders with radiculopathy, lumbar region: Secondary | ICD-10-CM | POA: Diagnosis not present

## 2018-12-08 DIAGNOSIS — K8030 Calculus of bile duct with cholangitis, unspecified, without obstruction: Secondary | ICD-10-CM | POA: Diagnosis not present

## 2018-12-08 DIAGNOSIS — D3501 Benign neoplasm of right adrenal gland: Secondary | ICD-10-CM | POA: Diagnosis not present

## 2018-12-08 DIAGNOSIS — N400 Enlarged prostate without lower urinary tract symptoms: Secondary | ICD-10-CM | POA: Diagnosis not present

## 2018-12-08 DIAGNOSIS — E1140 Type 2 diabetes mellitus with diabetic neuropathy, unspecified: Secondary | ICD-10-CM | POA: Diagnosis not present

## 2018-12-08 DIAGNOSIS — I2510 Atherosclerotic heart disease of native coronary artery without angina pectoris: Secondary | ICD-10-CM | POA: Diagnosis not present

## 2018-12-08 DIAGNOSIS — F0390 Unspecified dementia without behavioral disturbance: Secondary | ICD-10-CM | POA: Diagnosis not present

## 2018-12-08 DIAGNOSIS — M1990 Unspecified osteoarthritis, unspecified site: Secondary | ICD-10-CM | POA: Diagnosis not present

## 2018-12-13 DIAGNOSIS — F0390 Unspecified dementia without behavioral disturbance: Secondary | ICD-10-CM | POA: Diagnosis not present

## 2018-12-13 DIAGNOSIS — D3501 Benign neoplasm of right adrenal gland: Secondary | ICD-10-CM | POA: Diagnosis not present

## 2018-12-13 DIAGNOSIS — M5116 Intervertebral disc disorders with radiculopathy, lumbar region: Secondary | ICD-10-CM | POA: Diagnosis not present

## 2018-12-13 DIAGNOSIS — N400 Enlarged prostate without lower urinary tract symptoms: Secondary | ICD-10-CM | POA: Diagnosis not present

## 2018-12-13 DIAGNOSIS — I10 Essential (primary) hypertension: Secondary | ICD-10-CM | POA: Diagnosis not present

## 2018-12-13 DIAGNOSIS — E1140 Type 2 diabetes mellitus with diabetic neuropathy, unspecified: Secondary | ICD-10-CM | POA: Diagnosis not present

## 2018-12-13 DIAGNOSIS — K8030 Calculus of bile duct with cholangitis, unspecified, without obstruction: Secondary | ICD-10-CM | POA: Diagnosis not present

## 2018-12-13 DIAGNOSIS — I2510 Atherosclerotic heart disease of native coronary artery without angina pectoris: Secondary | ICD-10-CM | POA: Diagnosis not present

## 2018-12-13 DIAGNOSIS — M1990 Unspecified osteoarthritis, unspecified site: Secondary | ICD-10-CM | POA: Diagnosis not present

## 2018-12-16 DIAGNOSIS — F0390 Unspecified dementia without behavioral disturbance: Secondary | ICD-10-CM | POA: Diagnosis not present

## 2018-12-16 DIAGNOSIS — M5116 Intervertebral disc disorders with radiculopathy, lumbar region: Secondary | ICD-10-CM | POA: Diagnosis not present

## 2018-12-16 DIAGNOSIS — E1140 Type 2 diabetes mellitus with diabetic neuropathy, unspecified: Secondary | ICD-10-CM | POA: Diagnosis not present

## 2018-12-16 DIAGNOSIS — D3501 Benign neoplasm of right adrenal gland: Secondary | ICD-10-CM | POA: Diagnosis not present

## 2018-12-16 DIAGNOSIS — I2510 Atherosclerotic heart disease of native coronary artery without angina pectoris: Secondary | ICD-10-CM | POA: Diagnosis not present

## 2018-12-16 DIAGNOSIS — K8030 Calculus of bile duct with cholangitis, unspecified, without obstruction: Secondary | ICD-10-CM | POA: Diagnosis not present

## 2018-12-16 DIAGNOSIS — I10 Essential (primary) hypertension: Secondary | ICD-10-CM | POA: Diagnosis not present

## 2018-12-16 DIAGNOSIS — M1990 Unspecified osteoarthritis, unspecified site: Secondary | ICD-10-CM | POA: Diagnosis not present

## 2018-12-16 DIAGNOSIS — N400 Enlarged prostate without lower urinary tract symptoms: Secondary | ICD-10-CM | POA: Diagnosis not present

## 2018-12-19 DIAGNOSIS — M1990 Unspecified osteoarthritis, unspecified site: Secondary | ICD-10-CM | POA: Diagnosis not present

## 2018-12-19 DIAGNOSIS — N400 Enlarged prostate without lower urinary tract symptoms: Secondary | ICD-10-CM | POA: Diagnosis not present

## 2018-12-19 DIAGNOSIS — M5116 Intervertebral disc disorders with radiculopathy, lumbar region: Secondary | ICD-10-CM | POA: Diagnosis not present

## 2018-12-19 DIAGNOSIS — D3501 Benign neoplasm of right adrenal gland: Secondary | ICD-10-CM | POA: Diagnosis not present

## 2018-12-19 DIAGNOSIS — K8030 Calculus of bile duct with cholangitis, unspecified, without obstruction: Secondary | ICD-10-CM | POA: Diagnosis not present

## 2018-12-19 DIAGNOSIS — I10 Essential (primary) hypertension: Secondary | ICD-10-CM | POA: Diagnosis not present

## 2018-12-19 DIAGNOSIS — E1140 Type 2 diabetes mellitus with diabetic neuropathy, unspecified: Secondary | ICD-10-CM | POA: Diagnosis not present

## 2018-12-19 DIAGNOSIS — F0390 Unspecified dementia without behavioral disturbance: Secondary | ICD-10-CM | POA: Diagnosis not present

## 2018-12-19 DIAGNOSIS — I2510 Atherosclerotic heart disease of native coronary artery without angina pectoris: Secondary | ICD-10-CM | POA: Diagnosis not present

## 2018-12-22 DIAGNOSIS — M1990 Unspecified osteoarthritis, unspecified site: Secondary | ICD-10-CM | POA: Diagnosis not present

## 2018-12-22 DIAGNOSIS — K8030 Calculus of bile duct with cholangitis, unspecified, without obstruction: Secondary | ICD-10-CM | POA: Diagnosis not present

## 2018-12-22 DIAGNOSIS — F0390 Unspecified dementia without behavioral disturbance: Secondary | ICD-10-CM | POA: Diagnosis not present

## 2018-12-22 DIAGNOSIS — D3501 Benign neoplasm of right adrenal gland: Secondary | ICD-10-CM | POA: Diagnosis not present

## 2018-12-22 DIAGNOSIS — M5116 Intervertebral disc disorders with radiculopathy, lumbar region: Secondary | ICD-10-CM | POA: Diagnosis not present

## 2018-12-22 DIAGNOSIS — I10 Essential (primary) hypertension: Secondary | ICD-10-CM | POA: Diagnosis not present

## 2018-12-22 DIAGNOSIS — E1140 Type 2 diabetes mellitus with diabetic neuropathy, unspecified: Secondary | ICD-10-CM | POA: Diagnosis not present

## 2018-12-22 DIAGNOSIS — I2510 Atherosclerotic heart disease of native coronary artery without angina pectoris: Secondary | ICD-10-CM | POA: Diagnosis not present

## 2018-12-22 DIAGNOSIS — N400 Enlarged prostate without lower urinary tract symptoms: Secondary | ICD-10-CM | POA: Diagnosis not present

## 2019-01-06 DIAGNOSIS — R195 Other fecal abnormalities: Secondary | ICD-10-CM | POA: Diagnosis not present

## 2019-01-06 DIAGNOSIS — Z8659 Personal history of other mental and behavioral disorders: Secondary | ICD-10-CM | POA: Diagnosis not present

## 2019-01-20 DIAGNOSIS — K921 Melena: Secondary | ICD-10-CM | POA: Diagnosis not present

## 2019-01-20 DIAGNOSIS — M7989 Other specified soft tissue disorders: Secondary | ICD-10-CM | POA: Diagnosis not present

## 2019-01-25 DIAGNOSIS — K921 Melena: Secondary | ICD-10-CM | POA: Diagnosis not present

## 2019-04-25 DIAGNOSIS — F0391 Unspecified dementia with behavioral disturbance: Secondary | ICD-10-CM | POA: Diagnosis not present

## 2019-04-25 DIAGNOSIS — E1159 Type 2 diabetes mellitus with other circulatory complications: Secondary | ICD-10-CM | POA: Diagnosis not present

## 2019-04-25 DIAGNOSIS — F05 Delirium due to known physiological condition: Secondary | ICD-10-CM | POA: Diagnosis not present

## 2019-08-30 DIAGNOSIS — G301 Alzheimer's disease with late onset: Secondary | ICD-10-CM | POA: Diagnosis not present

## 2019-08-30 DIAGNOSIS — G4701 Insomnia due to medical condition: Secondary | ICD-10-CM | POA: Diagnosis not present

## 2019-08-30 DIAGNOSIS — F0281 Dementia in other diseases classified elsewhere with behavioral disturbance: Secondary | ICD-10-CM | POA: Diagnosis not present

## 2019-11-02 DIAGNOSIS — E1159 Type 2 diabetes mellitus with other circulatory complications: Secondary | ICD-10-CM | POA: Diagnosis not present

## 2019-11-02 DIAGNOSIS — F0390 Unspecified dementia without behavioral disturbance: Secondary | ICD-10-CM | POA: Diagnosis not present

## 2019-11-02 DIAGNOSIS — Z87891 Personal history of nicotine dependence: Secondary | ICD-10-CM | POA: Diagnosis not present

## 2019-11-02 DIAGNOSIS — I10 Essential (primary) hypertension: Secondary | ICD-10-CM | POA: Diagnosis not present

## 2019-11-02 DIAGNOSIS — R631 Polydipsia: Secondary | ICD-10-CM | POA: Diagnosis not present

## 2019-11-02 DIAGNOSIS — F4489 Other dissociative and conversion disorders: Secondary | ICD-10-CM | POA: Diagnosis not present

## 2019-11-03 ENCOUNTER — Emergency Department: Payer: Medicare HMO

## 2019-11-03 ENCOUNTER — Inpatient Hospital Stay
Admission: EM | Admit: 2019-11-03 | Discharge: 2019-11-09 | DRG: 178 | Disposition: A | Discharge status: Home / Self Care | Payer: Medicare HMO | Attending: Internal Medicine | Admitting: Internal Medicine

## 2019-11-03 ENCOUNTER — Encounter: Payer: Self-pay | Admitting: *Deleted

## 2019-11-03 DIAGNOSIS — F03918 Unspecified dementia, unspecified severity, with other behavioral disturbance: Secondary | ICD-10-CM | POA: Diagnosis present

## 2019-11-03 DIAGNOSIS — N400 Enlarged prostate without lower urinary tract symptoms: Secondary | ICD-10-CM | POA: Diagnosis present

## 2019-11-03 DIAGNOSIS — E870 Hyperosmolality and hypernatremia: Secondary | ICD-10-CM | POA: Diagnosis not present

## 2019-11-03 DIAGNOSIS — M549 Dorsalgia, unspecified: Secondary | ICD-10-CM | POA: Diagnosis present

## 2019-11-03 DIAGNOSIS — K5730 Diverticulosis of large intestine without perforation or abscess without bleeding: Secondary | ICD-10-CM | POA: Diagnosis not present

## 2019-11-03 DIAGNOSIS — B964 Proteus (mirabilis) (morganii) as the cause of diseases classified elsewhere: Secondary | ICD-10-CM | POA: Diagnosis present

## 2019-11-03 DIAGNOSIS — E785 Hyperlipidemia, unspecified: Secondary | ICD-10-CM | POA: Diagnosis present

## 2019-11-03 DIAGNOSIS — Z20828 Contact with and (suspected) exposure to other viral communicable diseases: Secondary | ICD-10-CM | POA: Diagnosis present

## 2019-11-03 DIAGNOSIS — J320 Chronic maxillary sinusitis: Secondary | ICD-10-CM | POA: Diagnosis present

## 2019-11-03 DIAGNOSIS — K219 Gastro-esophageal reflux disease without esophagitis: Secondary | ICD-10-CM | POA: Diagnosis not present

## 2019-11-03 DIAGNOSIS — I69391 Dysphagia following cerebral infarction: Secondary | ICD-10-CM

## 2019-11-03 DIAGNOSIS — G301 Alzheimer's disease with late onset: Secondary | ICD-10-CM | POA: Diagnosis not present

## 2019-11-03 DIAGNOSIS — R680 Hypothermia, not associated with low environmental temperature: Secondary | ICD-10-CM | POA: Diagnosis present

## 2019-11-03 DIAGNOSIS — M1990 Unspecified osteoarthritis, unspecified site: Secondary | ICD-10-CM | POA: Diagnosis present

## 2019-11-03 DIAGNOSIS — I2510 Atherosclerotic heart disease of native coronary artery without angina pectoris: Secondary | ICD-10-CM | POA: Diagnosis present

## 2019-11-03 DIAGNOSIS — K2980 Duodenitis without bleeding: Secondary | ICD-10-CM | POA: Diagnosis not present

## 2019-11-03 DIAGNOSIS — Z0000 Encounter for general adult medical examination without abnormal findings: Secondary | ICD-10-CM | POA: Diagnosis not present

## 2019-11-03 DIAGNOSIS — R1310 Dysphagia, unspecified: Secondary | ICD-10-CM | POA: Diagnosis present

## 2019-11-03 DIAGNOSIS — T68XXXA Hypothermia, initial encounter: Secondary | ICD-10-CM | POA: Diagnosis not present

## 2019-11-03 DIAGNOSIS — I10 Essential (primary) hypertension: Secondary | ICD-10-CM | POA: Diagnosis present

## 2019-11-03 DIAGNOSIS — Z720 Tobacco use: Secondary | ICD-10-CM

## 2019-11-03 DIAGNOSIS — Z87442 Personal history of urinary calculi: Secondary | ICD-10-CM

## 2019-11-03 DIAGNOSIS — R531 Weakness: Secondary | ICD-10-CM | POA: Diagnosis not present

## 2019-11-03 DIAGNOSIS — F0281 Dementia in other diseases classified elsewhere with behavioral disturbance: Secondary | ICD-10-CM | POA: Diagnosis not present

## 2019-11-03 DIAGNOSIS — F0391 Unspecified dementia with behavioral disturbance: Secondary | ICD-10-CM

## 2019-11-03 DIAGNOSIS — E1165 Type 2 diabetes mellitus with hyperglycemia: Secondary | ICD-10-CM | POA: Diagnosis present

## 2019-11-03 DIAGNOSIS — Z23 Encounter for immunization: Secondary | ICD-10-CM

## 2019-11-03 DIAGNOSIS — A419 Sepsis, unspecified organism: Secondary | ICD-10-CM

## 2019-11-03 DIAGNOSIS — J690 Pneumonitis due to inhalation of food and vomit: Principal | ICD-10-CM | POA: Diagnosis present

## 2019-11-03 DIAGNOSIS — R404 Transient alteration of awareness: Secondary | ICD-10-CM | POA: Diagnosis not present

## 2019-11-03 DIAGNOSIS — J189 Pneumonia, unspecified organism: Secondary | ICD-10-CM | POA: Diagnosis present

## 2019-11-03 DIAGNOSIS — Z794 Long term (current) use of insulin: Secondary | ICD-10-CM

## 2019-11-03 DIAGNOSIS — N390 Urinary tract infection, site not specified: Secondary | ICD-10-CM | POA: Diagnosis present

## 2019-11-03 DIAGNOSIS — R001 Bradycardia, unspecified: Secondary | ICD-10-CM | POA: Diagnosis not present

## 2019-11-03 DIAGNOSIS — N200 Calculus of kidney: Secondary | ICD-10-CM | POA: Diagnosis not present

## 2019-11-03 DIAGNOSIS — G8929 Other chronic pain: Secondary | ICD-10-CM | POA: Diagnosis present

## 2019-11-03 DIAGNOSIS — Z7401 Bed confinement status: Secondary | ICD-10-CM | POA: Diagnosis not present

## 2019-11-03 DIAGNOSIS — Z79899 Other long term (current) drug therapy: Secondary | ICD-10-CM

## 2019-11-03 DIAGNOSIS — Z7982 Long term (current) use of aspirin: Secondary | ICD-10-CM

## 2019-11-03 DIAGNOSIS — Z1639 Resistance to other specified antimicrobial drug: Secondary | ICD-10-CM | POA: Diagnosis present

## 2019-11-03 DIAGNOSIS — R918 Other nonspecific abnormal finding of lung field: Secondary | ICD-10-CM | POA: Diagnosis not present

## 2019-11-03 DIAGNOSIS — T68XXXD Hypothermia, subsequent encounter: Secondary | ICD-10-CM | POA: Diagnosis not present

## 2019-11-03 DIAGNOSIS — M2550 Pain in unspecified joint: Secondary | ICD-10-CM | POA: Diagnosis not present

## 2019-11-03 LAB — URINALYSIS, COMPLETE (UACMP) WITH MICROSCOPIC
Bacteria, UA: NONE SEEN
Bilirubin Urine: NEGATIVE
Glucose, UA: >=500 mg/dL — AB
Ketones, ur: 5 mg/dL — AB
Leukocytes,Ua: NEGATIVE
Nitrite: NEGATIVE
Protein, ur: 30 mg/dL — AB
Specific Gravity, Urine: 1.028 (ref 1.005–1.030)
pH: 6 (ref 5.0–8.0)

## 2019-11-03 LAB — LACTIC ACID, PLASMA
Lactic Acid, Venous: 1.9 mmol/L (ref 0.5–1.9)
Lactic Acid, Venous: 1 mmol/L (ref 0.5–1.9)

## 2019-11-03 LAB — CBC WITH DIFFERENTIAL/PLATELET
Abs Immature Granulocytes: 0.13 10*3/uL — ABNORMAL HIGH (ref 0.00–0.07)
Basophils Absolute: 0.1 10*3/uL (ref 0.0–0.1)
Basophils Relative: 1 %
Eosinophils Absolute: 0.1 10*3/uL (ref 0.0–0.5)
Eosinophils Relative: 1 %
HCT: 47.6 % (ref 39.0–52.0)
Hemoglobin: 15.8 g/dL (ref 13.0–17.0)
Immature Granulocytes: 1 %
Lymphocytes Relative: 7 %
Lymphs Abs: 0.8 10*3/uL (ref 0.7–4.0)
MCH: 29.2 pg (ref 26.0–34.0)
MCHC: 33.2 g/dL (ref 30.0–36.0)
MCV: 88 fL (ref 80.0–100.0)
Monocytes Absolute: 1.1 10*3/uL — ABNORMAL HIGH (ref 0.1–1.0)
Monocytes Relative: 10 %
Neutro Abs: 9.6 10*3/uL — ABNORMAL HIGH (ref 1.7–7.7)
Neutrophils Relative %: 80 %
Platelets: 95 10*3/uL — ABNORMAL LOW (ref 150–400)
RBC: 5.41 MIL/uL (ref 4.22–5.81)
RDW: 14.4 % (ref 11.5–15.5)
WBC: 11.7 10*3/uL — ABNORMAL HIGH (ref 4.0–10.5)
nRBC: 0.2 % (ref 0.0–0.2)

## 2019-11-03 LAB — COMPREHENSIVE METABOLIC PANEL
ALT: 19 U/L (ref 0–44)
AST: 22 U/L (ref 15–41)
Albumin: 3.4 g/dL — ABNORMAL LOW (ref 3.5–5.0)
Alkaline Phosphatase: 115 U/L (ref 38–126)
Anion gap: 11 (ref 5–15)
BUN: 45 mg/dL — ABNORMAL HIGH (ref 8–23)
CO2: 22 mmol/L (ref 22–32)
Calcium: 8.2 mg/dL — ABNORMAL LOW (ref 8.9–10.3)
Chloride: 109 mmol/L (ref 98–111)
Creatinine, Ser: 1.18 mg/dL (ref 0.61–1.24)
GFR calc Af Amer: >60 mL/min (ref >60)
GFR calc non Af Amer: 56 mL/min — ABNORMAL LOW (ref >60)
Glucose, Bld: 301 mg/dL — ABNORMAL HIGH (ref 70–99)
Potassium: 4.6 mmol/L (ref 3.5–5.1)
Sodium: 142 mmol/L (ref 135–145)
Total Bilirubin: 2.2 mg/dL — ABNORMAL HIGH (ref 0.3–1.2)
Total Protein: 7.3 g/dL (ref 6.5–8.1)

## 2019-11-03 LAB — TROPONIN I (HIGH SENSITIVITY)
Troponin I (High Sensitivity): 9 ng/L (ref <18)
Troponin I (High Sensitivity): 11 ng/L (ref <18)

## 2019-11-03 LAB — SARS CORONAVIRUS 2 (TAT 6-24 HRS): SARS Coronavirus 2: NEGATIVE

## 2019-11-03 LAB — PROTIME-INR
INR: 1.1 (ref 0.8–1.2)
Prothrombin Time: 14.5 seconds (ref 11.4–15.2)

## 2019-11-03 LAB — GLUCOSE, CAPILLARY
Glucose-Capillary: 267 mg/dL — ABNORMAL HIGH (ref 70–99)
Glucose-Capillary: 241 mg/dL — ABNORMAL HIGH (ref 70–99)

## 2019-11-03 LAB — BRAIN NATRIURETIC PEPTIDE: B Natriuretic Peptide: 273 pg/mL — ABNORMAL HIGH (ref 0.0–100.0)

## 2019-11-03 LAB — APTT: aPTT: 57 seconds — ABNORMAL HIGH (ref 24–36)

## 2019-11-03 LAB — TSH: TSH: 2.411 u[IU]/mL (ref 0.350–4.500)

## 2019-11-03 LAB — LIPASE, BLOOD: Lipase: 53 U/L — ABNORMAL HIGH (ref 11–51)

## 2019-11-03 MED ORDER — SODIUM CHLORIDE 0.9 % IV SOLN
500.0000 mg | Freq: Once | INTRAVENOUS | Status: AC
  Administered 2019-11-03: 500 mg via INTRAVENOUS
  Filled 2019-11-03: qty 500

## 2019-11-03 MED ORDER — SODIUM CHLORIDE 0.9 % IV SOLN
1.0000 g | Freq: Once | INTRAVENOUS | Status: AC
  Administered 2019-11-03: 1 g via INTRAVENOUS
  Filled 2019-11-03: qty 1

## 2019-11-03 MED ORDER — SODIUM CHLORIDE 0.9 % IV SOLN
500.0000 mg | INTRAVENOUS | Status: AC
  Administered 2019-11-04 – 2019-11-05 (×2): 500 mg via INTRAVENOUS
  Filled 2019-11-03 (×2): qty 500

## 2019-11-03 MED ORDER — SODIUM CHLORIDE 0.9 % IV BOLUS
1000.0000 mL | Freq: Once | INTRAVENOUS | Status: AC
  Administered 2019-11-03: 1000 mL via INTRAVENOUS

## 2019-11-03 MED ORDER — IOHEXOL 300 MG/ML  SOLN
100.0000 mL | Freq: Once | INTRAMUSCULAR | Status: AC | PRN
  Administered 2019-11-03: 16:00:00 100 mL via INTRAVENOUS

## 2019-11-03 MED ORDER — PANTOPRAZOLE SODIUM 40 MG IV SOLR
40.0000 mg | Freq: Once | INTRAVENOUS | Status: AC
  Administered 2019-11-03: 40 mg via INTRAVENOUS
  Filled 2019-11-03: qty 40

## 2019-11-03 MED ORDER — SODIUM CHLORIDE 0.9 % IV SOLN
1.0000 g | INTRAVENOUS | Status: DC
  Administered 2019-11-03 – 2019-11-08 (×6): 1 g via INTRAVENOUS
  Filled 2019-11-03 (×2): qty 10
  Filled 2019-11-03: qty 1
  Filled 2019-11-03: qty 10
  Filled 2019-11-03 (×2): qty 1
  Filled 2019-11-03: qty 10
  Filled 2019-11-03: qty 1

## 2019-11-03 MED ORDER — PANTOPRAZOLE SODIUM 40 MG IV SOLR
40.0000 mg | Freq: Two times a day (BID) | INTRAVENOUS | Status: DC
  Administered 2019-11-04 – 2019-11-06 (×5): 40 mg via INTRAVENOUS
  Filled 2019-11-03 (×5): qty 40

## 2019-11-03 MED ORDER — DIPHENHYDRAMINE HCL 50 MG/ML IJ SOLN
12.5000 mg | Freq: Once | INTRAMUSCULAR | Status: DC
  Filled 2019-11-03: qty 1

## 2019-11-03 MED ORDER — ENOXAPARIN SODIUM 40 MG/0.4ML ~~LOC~~ SOLN
40.0000 mg | SUBCUTANEOUS | Status: DC
  Administered 2019-11-03 – 2019-11-08 (×6): 40 mg via SUBCUTANEOUS
  Filled 2019-11-03 (×7): qty 0.4

## 2019-11-03 NOTE — ED Notes (Signed)
Patient has been taken to CT. Zithromax paused.

## 2019-11-03 NOTE — ED Notes (Signed)
Daughter has left the bedside. Bair Hugger still in place. Patient is sleeping. Awakens easily.

## 2019-11-03 NOTE — ED Notes (Signed)
Patient was taken to isolation shower room to obtain urine sample and Covid swab. Patient's brief was changed at that time and patient was placed in hospital gown. Rectal temp was obtained at that time.

## 2019-11-03 NOTE — ED Notes (Signed)
Dr. Cinda Quest aware of patient's temp.

## 2019-11-03 NOTE — H&P (Signed)
History and Physical    Matthew Schultz [id number] DOB: 1932-10-04 DOA: 11/03/2019  PCP: [place] Pink, MD  Patient coming from: Home, patient lives with his 2 daughters and son.  Youngest daughter is at bedside to provide history.  I have personally briefly reviewed patient's old medical records in [place]  Chief Complaint: Altered mental status, decreased p.o. intake  HPI: Matthew Schultz is a 85 y.o. male with medical history significant of Dementia, CVA with dysphagia, CAD, hypertension, type 2 diabetes who presents with concerns of decreased p.o. intake, increased thirst and altered mental status since last week.  Daughter reports that patient presented to his primary physician yesterday and had lab work drawn.  Lab work resulted today with hyperglycemia and they were asked to present to the ED for further evaluation. Daughter has noticed that he has had a new cough but denies any fevers.  Also notes increased fatigue and the patient has been communicating less than usual.  He has chronic back pain but does not complain of any acute pai. Today patient told his daughter that he felt "sick."  ED Course: He was initially hypothermic with temperature down to 93.6 with improvement of temperature up to 96 after being placed on Bair hugger.  He was normotensive on room air.  Mild leukocytosis of 11.7 and normal hemoglobin.  Platelet of 95.  CMP showed normal creatinine of 1.18.  Mildly elevated total bilirubin of 2.2 but with hemolysis.  Troponin of 9 and 11.  Lactic acid of 1.9.  TSH normal 2.4.  Urinalysis negative. CT abdomen showed inflammatory changes of the right upper quadrant abdomen likely duodenitis/ulcer disease. CT head showed no acute abnormality and had mild chronic bilateral maxillary sinusitis.  Review of Systems:  Unable to obtain due to patient's dementia  Past Medical History:  Diagnosis Date  . Arthritis   . BPH (benign prostatic hyperplasia)   . CAD  (coronary artery disease)   . DM (diabetes mellitus) ([place])   . HLD (hyperlipidemia)   . HTN (hypertension)   . Hydronephrosis   . Incomplete bladder emptying   . OA (osteoarthritis)   . Renal mass   . Stroke [facility])     Past Surgical History:  Procedure Laterality Date  . BACK SURGERY    . ERCP N/A 09/27/2018   Procedure: ENDOSCOPIC RETROGRADE CHOLANGIOPANCREATOGRAPHY (ERCP);  Surgeon: Clarene Essex, MD;  Location: [place];  Service: Endoscopy;  Laterality: N/A;  . REMOVAL OF STONES  09/27/2018   Procedure: REMOVAL OF STONES;  Surgeon: Clarene Essex, MD;  Location: [facility] ENDOSCOPY;  Service: Endoscopy;;  . Joan Mayans  09/27/2018   Procedure: Joan Mayans;  Surgeon: Clarene Essex, MD;  Location: [facility] ENDOSCOPY;  Service: Endoscopy;;     reports that he has quit smoking. His smoking use included cigarettes. His smokeless tobacco use includes chew. He reports that he does not drink alcohol or use drugs.  No Known Allergies  Family History  Problem Relation Age of Onset  . Pneumonia Father   . Kidney cancer Neg Hx   . Kidney disease Neg Hx   . Prostate cancer Neg Hx     Family history reviewed and not pertinent   Prior to Admission medications   Medication Sig Start Date End Date Taking? Authorizing Provider  risperiDONE (RISPERDAL) 0.5 MG tablet Take 1 tablet (0.5 mg total) by mouth at bedtime. 10/03/18  Yes Ghimire, Henreitta Leber, MD  aspirin EC 81 MG tablet Take 81 mg by mouth daily.  [provider]  donepezil (ARICEPT) 5 MG tablet Take 1 tablet by mouth daily. 04/06/18   [provider]  feeding supplement, ENSURE ENLIVE, (ENSURE ENLIVE) LIQD Take 237 mLs by mouth 3 (three) times daily between meals. 04/15/18   Saundra Shelling, MD  gabapentin (NEURONTIN) 300 MG capsule Take 300 mg by mouth at bedtime.     [provider]  insulin aspart (NOVOLOG) 100 UNIT/ML injection 0-9 Units, Subcutaneous, 3 times daily with meals CBG < 70: implement hypoglycemia  protocol CBG 70 - 120: 0 units CBG 121 - 150: 1 unit CBG 151 - 200: 2 units CBG 201 - 250: 3 units CBG 251 - 300: 5 units CBG 301 - 350: 7 units CBG 351 - 400: 9 units CBG > 400: call MD 10/03/18   Jonetta Osgood, MD  omeprazole (PRILOSEC) 20 MG capsule Take 20 mg by mouth 2 (two) times daily.    [provider]  tamsulosin (FLOMAX) 0.4 MG CAPS capsule Take 1 capsule (0.4 mg total) by mouth daily. 09/07/16   McGowan, Larene Beach A, PA-C  traMADol (ULTRAM) 50 MG tablet Take 1 tablet (50 mg total) by mouth every 8 (eight) hours as needed. 10/03/18   Jonetta Osgood, MD    Physical Exam: Vitals:   11/03/19 1730 11/03/19 1800 11/03/19 1824 11/03/19 1900  BP: 109/71 132/88  99/65  Pulse: 61 72  60  Resp: 16 16  15   Temp:   (!) 96 F (35.6 C)   TempSrc:   Axillary   SpO2: 92% 93%  90%    Constitutional: NAD, calm, comfortable, asleep in bed but easily awakes to voice Vitals:   11/03/19 1730 11/03/19 1800 11/03/19 1824 11/03/19 1900  BP: 109/71 132/88  99/65  Pulse: 61 72  60  Resp: 16 16  15   Temp:   (!) 96 F (35.6 C)   TempSrc:   Axillary   SpO2: 92% 93%  90%   Eyes: PERRL, lids and conjunctivae normal ENMT: Mucous membranes are moist.  Neck: normal, supple, no masses Respiratory: clear to auscultation bilaterally, no wheezing, no crackles. Normal respiratory effort on room air. No accessory muscle use.  Cardiovascular: Regular rate and rhythm, no murmurs / rubs / gallops. No extremity edema. 2+ pedal pulses.   Abdomen:grimace with palpation of the epigastric area, no masses palpated. Bowel sounds positive.  Musculoskeletal: no clubbing / cyanosis. No joint deformity upper and lower extremities. Good ROM, no contractures. Normal muscle tone.  Skin: no rashes, lesions, ulcers. No induration Neurologic: CN 2-12 grossly intact. Sensation intact. Did not follow simple commands and only said "What do you want?" when woken up Psychiatric:unable to access due to dementia  and drowsiness   Labs on Admission: I have personally reviewed following labs and imaging studies  CBC: Recent Labs  Lab 11/03/19 1239  WBC 11.7*  NEUTROABS 9.6*  HGB 15.8  HCT 47.6  MCV 88.0  PLT 95*   Basic Metabolic Panel: Recent Labs  Lab 11/03/19 1239  NA 142  K 4.6  CL 109  CO2 22  GLUCOSE 301*  BUN 45*  CREATININE 1.18  CALCIUM 8.2*   GFR: CrCl cannot be calculated (Unknown ideal weight.). Liver Function Tests: Recent Labs  Lab 11/03/19 1239  AST 22  ALT 19  ALKPHOS 115  BILITOT 2.2*  PROT 7.3  ALBUMIN 3.4*   Recent Labs  Lab 11/03/19 1239  LIPASE 53*   No results for input(s): AMMONIA in the last 168 hours.  Coagulation Profile: Recent Labs  Lab 11/03/19 1337  INR 1.1   Cardiac Enzymes: No results for input(s): CKTOTAL, CKMB, CKMBINDEX, TROPONINI in the last 168 hours. BNP (last 3 results) No results for input(s): PROBNP in the last 8760 hours. HbA1C: No results for input(s): HGBA1C in the last 72 hours. CBG: Recent Labs  Lab 11/03/19 1228 11/03/19 1621  GLUCAP 267* 241*   Lipid Profile: No results for input(s): CHOL, HDL, LDLCALC, TRIG, CHOLHDL, LDLDIRECT in the last 72 hours. Thyroid Function Tests: Recent Labs    11/03/19 1811  TSH 2.411   Anemia Panel: No results for input(s): VITAMINB12, FOLATE, FERRITIN, TIBC, IRON, RETICCTPCT in the last 72 hours. Urine analysis:    Component Value Date/Time   COLORURINE YELLOW (A) 11/03/2019 1239   APPEARANCEUR CLEAR (A) 11/03/2019 1239   APPEARANCEUR Clear 11/28/2013 1712   LABSPEC 1.028 11/03/2019 1239   LABSPEC 1.019 11/28/2013 1712   PHURINE 6.0 11/03/2019 1239   GLUCOSEU >=500 (A) 11/03/2019 1239   GLUCOSEU Negative 11/28/2013 1712   HGBUR MODERATE (A) 11/03/2019 1239   BILIRUBINUR NEGATIVE 11/03/2019 1239   BILIRUBINUR Negative 11/28/2013 1712   KETONESUR 5 (A) 11/03/2019 1239   PROTEINUR 30 (A) 11/03/2019 1239   NITRITE NEGATIVE 11/03/2019 1239   LEUKOCYTESUR NEGATIVE  11/03/2019 1239   LEUKOCYTESUR Negative 11/28/2013 1712    Radiological Exams on Admission: Dg Chest 1 View  Result Date: 11/03/2019 CLINICAL DATA:  85 year old male with a history of weakness EXAM: CHEST  1 VIEW COMPARISON:  09/25/2018 FINDINGS: Cardiomediastinal silhouette likely unchanged in size and contour with left rotation of the patient. Reticulonodular opacities of the lungs new from the prior. No pneumothorax. No large pleural effusion. Low lung volumes persist. No displaced fracture IMPRESSION: Reticulonodular opacities of the lungs with low lung volumes may reflect atelectasis and chronic changes, however, atypical infection not excluded. Electronically Signed   By: Corrie Mckusick D.O.   On: 11/03/2019 14:06   Ct Head Wo Contrast  Result Date: 11/03/2019 CLINICAL DATA:  Increased weakness over the past several days with poor p.o. intake for the past 12 hours. EXAM: CT HEAD WITHOUT CONTRAST TECHNIQUE: Contiguous axial images were obtained from the base of the skull through the vertex without intravenous contrast. COMPARISON:  09/25/2018. FINDINGS: Brain: Moderate to marked enlargement of the ventricles and subarachnoid spaces with mild progression. Stable moderate patchy white matter low density in both cerebral hemispheres. No intracranial hemorrhage, mass lesion or CT evidence of acute infarction. Vascular: No hyperdense vessel or unexpected calcification. Skull: Normal. Negative for fracture or focal lesion. Sinuses/Orbits: Status post bilateral cataract extraction. Mild bilateral maxillary sinus mucosal thickening. Other: None. IMPRESSION: 1. No acute abnormality. 2. Moderate to marked diffuse cerebral and cerebellar atrophy with mild progression. 3. Stable moderate chronic small vessel white matter ischemic changes in both cerebral hemispheres. 4. Mild chronic bilateral maxillary sinusitis. Electronically Signed   By: Claudie Revering M.D.   On: 11/03/2019 16:18   Ct Abdomen Pelvis W Contrast   Result Date: 11/03/2019 CLINICAL DATA:  Increased weakness over the past few days with poor p.o. intake for the past 12 hours. EXAM: CT ABDOMEN AND PELVIS WITH CONTRAST TECHNIQUE: Multidetector CT imaging of the abdomen and pelvis was performed using the standard protocol following bolus administration of intravenous contrast. CONTRAST:  181mL OMNIPAQUE IOHEXOL 300 MG/ML  SOLN COMPARISON:  09/25/2018. FINDINGS: Lower chest: Interval small right pleural effusion and right lower lobe airspace consolidation. Interval mild left lower lobe airspace consolidation without pleural fluid.  Stable mildly enlarged heart. Atheromatous coronary artery calcifications. Hepatobiliary: Mild diffuse gallbladder wall thickening and enhancement with mild progression since the previous examination, with decreased distention of the gallbladder. Small amount of pericholecystic edema or fluid. No visible gallstones. Interval small amount of biliary air with resolution of the previously demonstrated intrahepatic and extrahepatic ductal dilatation. Interval ERCP and stone removal. Pancreas: Interval mild soft tissue stranding in the pancreatic head and adjacent tissues. More pronounced soft tissue stranding more superiorly, at the level of the 2nd portion of the duodenum. Spleen: Normal in size without focal abnormality. Adrenals/Urinary Tract: The previously demonstrated 2.0 cm low density right adrenal mass measures 2.0 cm on image number 29 series 2. Normal appearing left adrenal gland. Again noted is a right pelvic kidney containing adjacent 4 mm and 3 mm calculi. Also again demonstrated are parenchymal and possible parapelvic renal cysts on the right, difficult to distinguish from collecting system dilatation. Normally positioned left kidney with multiple smaller cysts. Mild diffuse bladder wall thickening with 2 tiny diverticula laterally on the right. No bladder, left kidney or left ureteral calculi. Stomach/Bowel: Multiple sigmoid  colon diverticula. Normal appearing appendix. Moderate diffuse wall thickening and enhancement involving the 2nd portion of the duodenum with extensive period duodenal soft tissue stranding. There is also mild-to-moderate dilatation of the proximal 3rd portion of the duodenum, filled with fluid. No obstructing mass visualized. Mild diffuse antral wall thickening. Vascular/Lymphatic: Atheromatous arterial calcifications without aneurysm. No enlarged lymph nodes. Reproductive: Minimally enlarged prostate gland. Other: Small bilateral inguinal hernias containing fat and very small umbilical hernia containing fat. Musculoskeletal: Mild to moderate levoconvex lumbar scoliosis. Stable lumbar spine degenerative changes and old L3 superior endplate compression deformity. IMPRESSION: 1. Interval extensive inflammatory changes in the right upper quadrant of the abdomen, centered at the level of the proximal 2nd portion of the duodenum. This is most compatible with marked duodenitis/ulcer disease. No evidence of perforation. 2. Mild inflammatory changes involving the head of the pancreas, most likely secondary to the inflammation involving the duodenum. Acute pancreatitis is less likely. 3. Mild inflammatory changes involving the gallbladder, also most likely secondary to the inflammation involving the duodenum. Mild changes of acute cholecystitis are less likely. 4. Interval small right pleural effusion and right lower lobe airspace atelectasis or pneumonia. 5. Interval mild left lower lobe airspace atelectasis or pneumonia. 6. Interval small amount of biliary air with resolution of the previously demonstrated intrahepatic and extrahepatic ductal dilatation, compatible with interval common duct stone extraction and sphincterotomy. 7. Stable right pelvic kidney containing adjacent 4 mm and 3 mm calculi. 8. Stable probable combination of marked right hydronephrosis and renal cyst formation, most likely due to chronic UPJ  obstruction. 9. Mild diffuse urinary bladder wall thickening with 2 tiny diverticula laterally on the right. This could be due to chronic bladder outlet obstruction by the enlarged prostate gland or chronic cystitis. 10. Sigmoid diverticulosis. Electronically Signed   By: Claudie Revering M.D.   On: 11/03/2019 16:37      Assessment/Plan  Hypothermia suspect secondary to community acquired pneumonia  - inital presenting temperature of 93.6 with suspected pneumonia given new cough and opacity of lung base worse on the right -negative urinalysis and normal TSH -CT abdomen shows duodenitis but no other significant infection -Continue Rocephin and Azithromycin - pending blood cultures  Duodenitis - IV PPI  - Evaluate for H.pylori   Hyperlipidemia -Continue simvastatin  Dementia -Continue Aricept, Risperdal -Continue trazodone -Hold hydroxyzine for now given his altered mental status  GERD -Continue PPI  BPH -Continue Flomax  DVT prophylaxis:Lovenox Code Status:Full Family Communication: Plan discussed with Daughter at bedside  disposition Plan: Home with at least 2 midnight stays  Consults called:  Admission status: inpatient   Vernia Teem T Sandrika Schwinn DO [facility]   If 7PM-7AM, please contact night-coverage [email or web address] Password [facility]  11/03/2019, 7:27 PM

## 2019-11-03 NOTE — ED Triage Notes (Signed)
Per EMS report, patient's labs from yesterday were drawn and showed a hight blood sugar. EMT reports a fingerstick blood glucose of 349. Family report increased weakness over the last few days and has had poor PO intake for the last 12 hours.

## 2019-11-03 NOTE — Progress Notes (Signed)
CODE SEPSIS - PHARMACY COMMUNICATION  **Broad Spectrum Antibiotics should be administered within 1 hour of Sepsis diagnosis**  Time Code Sepsis Called/Page Received: 1317  Antibiotics Ordered: cefepime  Time of 1st antibiotic administration: [id number]  Additional action taken by pharmacy: Spoke w/ RN at 1357 about administration of antibiotics   [place] Resident 11/03/2019  1:47 PM

## 2019-11-03 NOTE — ED Notes (Signed)
Bair Hugger turned off at this time.

## 2019-11-03 NOTE — ED Notes (Signed)
Patient is back from CT. Bair Hugger placed back on the patient. Zithromax infusing and patient hooked back to the monitor. Patient didn't open his eyes, but did move his arm when instructed.

## 2019-11-03 NOTE — ED Notes (Signed)
Daughter at bedside.

## 2019-11-03 NOTE — ED Notes (Signed)
Report given to Armanda Heritage RN

## 2019-11-03 NOTE — ED Provider Notes (Addendum)
[facility] Emergency Department Provider Note   ____________________________________________   First MD Initiated Contact with Patient 11/03/19 1221     (approximate)  I have reviewed the triage vital signs and the nursing notes.   HISTORY  Chief Complaint Hyperglycemia History limited by patient not speaking  HPI Matthew Schultz is a 85 y.o. male who comes from home.  He does not live by himself.  Reportedly his doctor called him and told his family his blood sugars all out of whack and he should come to the hospital.  EMS reports that the patient has not been eating much for the last few days he required 2 people to help him get to the stretcher and usually he walks by himself with a walker.  Usually he speaks and is not speaking at all now in the emergency room.      Past Medical History:  Diagnosis Date   Arthritis    BPH (benign prostatic hyperplasia)    CAD (coronary artery disease)    DM (diabetes mellitus) ([place])    HLD (hyperlipidemia)    HTN (hypertension)    Hydronephrosis    Incomplete bladder emptying    OA (osteoarthritis)    Renal mass    Stroke [facility])     Patient Active Problem List   Diagnosis Date Noted   Enterococcal bacteremia    Malnutrition of moderate degree 09/27/2018   Cholangiolitis 09/26/2018   Acute cholangitis 09/26/2018   Sepsis due to Escherichia coli ([place])    E coli bacteremia    Hyperbilirubinemia    Protein-calorie malnutrition, severe 04/14/2018   Dementia with behavioral disturbance ([place]) 04/14/2018   Acute cholecystitis 04/12/2018   Choledocholithiasis    Syncope 10/19/2016   AKI (acute kidney injury) ([place]) 10/19/2016   Cataract 09/07/2016   Coronary artery disease 09/07/2016   Diabetes ([place]) 09/07/2016   HTN (hypertension) 09/07/2016   Hyperlipidemia 09/07/2016   Osteoarthritis 09/07/2016   Stroke ([place]) 09/07/2016   DDD (degenerative disc disease), lumbar  08/23/2014   Lumbar radiculitis 08/23/2014   Low back pain 06/01/2014   Lumbar radiculopathy 06/01/2014    Past Surgical History:  Procedure Laterality Date   BACK SURGERY     ERCP N/A 09/27/2018   Procedure: ENDOSCOPIC RETROGRADE CHOLANGIOPANCREATOGRAPHY (ERCP);  Surgeon: Clarene Essex, MD;  Location: [place];  Service: Endoscopy;  Laterality: N/A;   REMOVAL OF STONES  09/27/2018   Procedure: REMOVAL OF STONES;  Surgeon: Clarene Essex, MD;  Location: [facility] ENDOSCOPY;  Service: Endoscopy;;   SPHINCTEROTOMY  09/27/2018   Procedure: Joan Mayans;  Surgeon: Clarene Essex, MD;  Location: [place];  Service: Endoscopy;;    Prior to Admission medications   Medication Sig Start Date End Date Taking? Authorizing Provider  risperiDONE (RISPERDAL) 0.5 MG tablet Take 1 tablet (0.5 mg total) by mouth at bedtime. 10/03/18  Yes Ghimire, Henreitta Leber, MD  aspirin EC 81 MG tablet Take 81 mg by mouth daily.    [provider]  donepezil (ARICEPT) 5 MG tablet Take 1 tablet by mouth daily. 04/06/18   [provider]  feeding supplement, ENSURE ENLIVE, (ENSURE ENLIVE) LIQD Take 237 mLs by mouth 3 (three) times daily between meals. 04/15/18   Saundra Shelling, MD  gabapentin (NEURONTIN) 300 MG capsule Take 300 mg by mouth at bedtime.     [provider]  insulin aspart (NOVOLOG) 100 UNIT/ML injection 0-9 Units, Subcutaneous, 3 times daily with meals CBG < 70: implement hypoglycemia protocol CBG 70 - 120:  0 units CBG 121 - 150: 1 unit CBG 151 - 200: 2 units CBG 201 - 250: 3 units CBG 251 - 300: 5 units CBG 301 - 350: 7 units CBG 351 - 400: 9 units CBG > 400: call MD 10/03/18   Jonetta Osgood, MD  omeprazole (PRILOSEC) 20 MG capsule Take 20 mg by mouth 2 (two) times daily.    [provider]  tamsulosin (FLOMAX) 0.4 MG CAPS capsule Take 1 capsule (0.4 mg total) by mouth daily. 09/07/16   McGowan, Larene Beach A, PA-C  traMADol (ULTRAM) 50 MG tablet Take 1 tablet (50 mg  total) by mouth every 8 (eight) hours as needed. 10/03/18   Ghimire, Henreitta Leber, MD    Allergies Patient has no known allergies.  Family History  Problem Relation Age of Onset   Pneumonia Father    Kidney cancer Neg Hx    Kidney disease Neg Hx    Prostate cancer Neg Hx     Social History Social History   Tobacco Use   Smoking status: Former Smoker    Types: Cigarettes   Smokeless tobacco: Current User    Types: Chew  Substance Use Topics   Alcohol use: No   Drug use: No    Review of Systems  Unable to obtain ____________________________________________   PHYSICAL EXAM:  VITAL SIGNS: ED Triage Vitals [11/03/19 1228]  Enc Vitals Group     BP 115/64     Pulse Rate (!) 46     Resp 18     Temp      Temp src      SpO2 95 %     Weight      Height      Head Circumference      Peak Flow      Pain Score      Pain Loc      Pain Edu?      Excl. in [place]?     Constitutional awake not really responding reclining in the stretcher with his right arm held up in the air in front of him Eyes: Conjunctivae are normal. PER. Marland Kitchen Head: Atraumatic. Nose: No congestion/rhinnorhea. Mouth/Throat: Mucous membranes are moist.  Neck: No stridor. Cardiovascular: Normal rate, regular rhythm. Grossly normal heart sounds.  Good peripheral circulation. Respiratory: Normal respiratory effort.  No retractions. Lungs CTAB. Gastrointestinal: Soft and nontender.  Slight distention. No abdominal bruits.  Musculoskeletal: No lower extremity tenderness nor edema.  Neurologic: Currently no speech . No gross focal neurologic deficits are appreciated. No gait instability. Skin:  Skin is warm, dry and intact.   ____________________________________________   LABS (all labs ordered are listed, but only abnormal results are displayed)  Labs Reviewed  GLUCOSE, CAPILLARY - Abnormal; Notable for the following components:      Result Value   Glucose-Capillary 267 (*)    All other components  within normal limits  COMPREHENSIVE METABOLIC PANEL - Abnormal; Notable for the following components:   Glucose, Bld 301 (*)    BUN 45 (*)    Calcium 8.2 (*)    Albumin 3.4 (*)    Total Bilirubin 2.2 (*)    GFR calc non Af Amer 56 (*)    All other components within normal limits  LIPASE, BLOOD - Abnormal; Notable for the following components:   Lipase 53 (*)    All other components within normal limits  BRAIN NATRIURETIC PEPTIDE - Abnormal; Notable for the following components:   B Natriuretic Peptide 273.0 (*)  All other components within normal limits  CBC WITH DIFFERENTIAL/PLATELET - Abnormal; Notable for the following components:   WBC 11.7 (*)    Platelets 95 (*)    Neutro Abs 9.6 (*)    Monocytes Absolute 1.1 (*)    Abs Immature Granulocytes 0.13 (*)    All other components within normal limits  URINALYSIS, COMPLETE (UACMP) WITH MICROSCOPIC - Abnormal; Notable for the following components:   Color, Urine YELLOW (*)    APPearance CLEAR (*)    Glucose, UA >=500 (*)    Hgb urine dipstick MODERATE (*)    Ketones, ur 5 (*)    Protein, ur 30 (*)    All other components within normal limits  BLOOD GAS, VENOUS - Abnormal; Notable for the following components:   Bicarbonate 29.7 (*)    Acid-Base Excess 3.7 (*)    All other components within normal limits  APTT - Abnormal; Notable for the following components:   aPTT 57 (*)    All other components within normal limits  GLUCOSE, CAPILLARY - Abnormal; Notable for the following components:   Glucose-Capillary 241 (*)    All other components within normal limits  SARS CORONAVIRUS 2 (TAT 6-24 HRS)  CULTURE, BLOOD (ROUTINE X 2)  CULTURE, BLOOD (ROUTINE X 2)  URINE CULTURE  LACTIC ACID, PLASMA  PROTIME-INR  LACTIC ACID, PLASMA  URINALYSIS, ROUTINE W REFLEX MICROSCOPIC  TSH  CBG MONITORING, ED  TROPONIN I (HIGH SENSITIVITY)  TROPONIN I (HIGH SENSITIVITY)   ____________________________________________  EKG  EKG read  interpreted by me shows bradycardia rate of about 55 normal axis wide QRS complexes possibly left bundle very low amplitude ____________________________________________  RADIOLOGY    Official radiology report(s): Dg Chest 1 View  Result Date: 11/03/2019 CLINICAL DATA:  85 year old male with a history of weakness EXAM: CHEST  1 VIEW COMPARISON:  09/25/2018 FINDINGS: Cardiomediastinal silhouette likely unchanged in size and contour with left rotation of the patient. Reticulonodular opacities of the lungs new from the prior. No pneumothorax. No large pleural effusion. Low lung volumes persist. No displaced fracture IMPRESSION: Reticulonodular opacities of the lungs with low lung volumes may reflect atelectasis and chronic changes, however, atypical infection not excluded. Electronically Signed   By: Corrie Mckusick D.O.   On: 11/03/2019 14:06   Ct Head Wo Contrast  Result Date: 11/03/2019 CLINICAL DATA:  Increased weakness over the past several days with poor p.o. intake for the past 12 hours. EXAM: CT HEAD WITHOUT CONTRAST TECHNIQUE: Contiguous axial images were obtained from the base of the skull through the vertex without intravenous contrast. COMPARISON:  09/25/2018. FINDINGS: Brain: Moderate to marked enlargement of the ventricles and subarachnoid spaces with mild progression. Stable moderate patchy white matter low density in both cerebral hemispheres. No intracranial hemorrhage, mass lesion or CT evidence of acute infarction. Vascular: No hyperdense vessel or unexpected calcification. Skull: Normal. Negative for fracture or focal lesion. Sinuses/Orbits: Status post bilateral cataract extraction. Mild bilateral maxillary sinus mucosal thickening. Other: None. IMPRESSION: 1. No acute abnormality. 2. Moderate to marked diffuse cerebral and cerebellar atrophy with mild progression. 3. Stable moderate chronic small vessel white matter ischemic changes in both cerebral hemispheres. 4. Mild chronic  bilateral maxillary sinusitis. Electronically Signed   By: Claudie Revering M.D.   On: 11/03/2019 16:18   Ct Abdomen Pelvis W Contrast  Result Date: 11/03/2019 CLINICAL DATA:  Increased weakness over the past few days with poor p.o. intake for the past 12 hours. EXAM: CT ABDOMEN AND PELVIS WITH  CONTRAST TECHNIQUE: Multidetector CT imaging of the abdomen and pelvis was performed using the standard protocol following bolus administration of intravenous contrast. CONTRAST:  117mL OMNIPAQUE IOHEXOL 300 MG/ML  SOLN COMPARISON:  09/25/2018. FINDINGS: Lower chest: Interval small right pleural effusion and right lower lobe airspace consolidation. Interval mild left lower lobe airspace consolidation without pleural fluid. Stable mildly enlarged heart. Atheromatous coronary artery calcifications. Hepatobiliary: Mild diffuse gallbladder wall thickening and enhancement with mild progression since the previous examination, with decreased distention of the gallbladder. Small amount of pericholecystic edema or fluid. No visible gallstones. Interval small amount of biliary air with resolution of the previously demonstrated intrahepatic and extrahepatic ductal dilatation. Interval ERCP and stone removal. Pancreas: Interval mild soft tissue stranding in the pancreatic head and adjacent tissues. More pronounced soft tissue stranding more superiorly, at the level of the 2nd portion of the duodenum. Spleen: Normal in size without focal abnormality. Adrenals/Urinary Tract: The previously demonstrated 2.0 cm low density right adrenal mass measures 2.0 cm on image number 29 series 2. Normal appearing left adrenal gland. Again noted is a right pelvic kidney containing adjacent 4 mm and 3 mm calculi. Also again demonstrated are parenchymal and possible parapelvic renal cysts on the right, difficult to distinguish from collecting system dilatation. Normally positioned left kidney with multiple smaller cysts. Mild diffuse bladder wall  thickening with 2 tiny diverticula laterally on the right. No bladder, left kidney or left ureteral calculi. Stomach/Bowel: Multiple sigmoid colon diverticula. Normal appearing appendix. Moderate diffuse wall thickening and enhancement involving the 2nd portion of the duodenum with extensive period duodenal soft tissue stranding. There is also mild-to-moderate dilatation of the proximal 3rd portion of the duodenum, filled with fluid. No obstructing mass visualized. Mild diffuse antral wall thickening. Vascular/Lymphatic: Atheromatous arterial calcifications without aneurysm. No enlarged lymph nodes. Reproductive: Minimally enlarged prostate gland. Other: Small bilateral inguinal hernias containing fat and very small umbilical hernia containing fat. Musculoskeletal: Mild to moderate levoconvex lumbar scoliosis. Stable lumbar spine degenerative changes and old L3 superior endplate compression deformity. IMPRESSION: 1. Interval extensive inflammatory changes in the right upper quadrant of the abdomen, centered at the level of the proximal 2nd portion of the duodenum. This is most compatible with marked duodenitis/ulcer disease. No evidence of perforation. 2. Mild inflammatory changes involving the head of the pancreas, most likely secondary to the inflammation involving the duodenum. Acute pancreatitis is less likely. 3. Mild inflammatory changes involving the gallbladder, also most likely secondary to the inflammation involving the duodenum. Mild changes of acute cholecystitis are less likely. 4. Interval small right pleural effusion and right lower lobe airspace atelectasis or pneumonia. 5. Interval mild left lower lobe airspace atelectasis or pneumonia. 6. Interval small amount of biliary air with resolution of the previously demonstrated intrahepatic and extrahepatic ductal dilatation, compatible with interval common duct stone extraction and sphincterotomy. 7. Stable right pelvic kidney containing adjacent 4 mm  and 3 mm calculi. 8. Stable probable combination of marked right hydronephrosis and renal cyst formation, most likely due to chronic UPJ obstruction. 9. Mild diffuse urinary bladder wall thickening with 2 tiny diverticula laterally on the right. This could be due to chronic bladder outlet obstruction by the enlarged prostate gland or chronic cystitis. 10. Sigmoid diverticulosis. Electronically Signed   By: Claudie Revering M.D.   On: 11/03/2019 16:37    ____________________________________________   PROCEDURES  Procedure(s) performed (including Critical Care): Critical care time 30 minutes.  This includes examining the patient checking on him afterwards again reviewing his old  records and his CT reports and speaking with the hospitalist.  Also includes of course managing his sepsis and hypothermia.  Procedures   ____________________________________________   INITIAL IMPRESSION / ASSESSMENT AND PLAN / ED COURSE           Clinical Course as of Nov 02 1740  Fri Nov 03, 2019  1408 Blood Culture (routine x 2) [PM]    Clinical Course User Index [PM] Nena Polio, MD     ____________________________________________   FINAL CLINICAL IMPRESSION(S) / ED DIAGNOSES  Final diagnoses:  Sepsis, due to unspecified organism, unspecified whether acute organ dysfunction present ([place])  Hypothermia, initial encounter  Duodenitis     ED Discharge Orders    None       Note:  This document was prepared using Dragon voice recognition software and may include unintentional dictation errors.    Nena Polio, MD 11/03/19 1741    Nena Polio, MD 11/21/19 [phone]

## 2019-11-04 ENCOUNTER — Other Ambulatory Visit: Payer: Self-pay

## 2019-11-04 DIAGNOSIS — T68XXXD Hypothermia, subsequent encounter: Secondary | ICD-10-CM

## 2019-11-04 DIAGNOSIS — N400 Enlarged prostate without lower urinary tract symptoms: Secondary | ICD-10-CM

## 2019-11-04 DIAGNOSIS — J690 Pneumonitis due to inhalation of food and vomit: Principal | ICD-10-CM

## 2019-11-04 DIAGNOSIS — K2980 Duodenitis without bleeding: Secondary | ICD-10-CM

## 2019-11-04 DIAGNOSIS — F0281 Dementia in other diseases classified elsewhere with behavioral disturbance: Secondary | ICD-10-CM

## 2019-11-04 DIAGNOSIS — E870 Hyperosmolality and hypernatremia: Secondary | ICD-10-CM

## 2019-11-04 DIAGNOSIS — G301 Alzheimer's disease with late onset: Secondary | ICD-10-CM

## 2019-11-04 DIAGNOSIS — J189 Pneumonia, unspecified organism: Secondary | ICD-10-CM

## 2019-11-04 LAB — CBC
HCT: 40.9 % (ref 39.0–52.0)
Hemoglobin: 13.4 g/dL (ref 13.0–17.0)
MCH: 28.6 pg (ref 26.0–34.0)
MCHC: 32.8 g/dL (ref 30.0–36.0)
MCV: 87.2 fL (ref 80.0–100.0)
Platelets: 100 10*3/uL — ABNORMAL LOW (ref 150–400)
RBC: 4.69 MIL/uL (ref 4.22–5.81)
RDW: 14.3 % (ref 11.5–15.5)
WBC: 11 10*3/uL — ABNORMAL HIGH (ref 4.0–10.5)
nRBC: 0.3 % — ABNORMAL HIGH (ref 0.0–0.2)

## 2019-11-04 LAB — COMPREHENSIVE METABOLIC PANEL
ALT: 15 U/L (ref 0–44)
AST: 13 U/L — ABNORMAL LOW (ref 15–41)
Albumin: 2.8 g/dL — ABNORMAL LOW (ref 3.5–5.0)
Alkaline Phosphatase: 94 U/L (ref 38–126)
Anion gap: 8 (ref 5–15)
BUN: 35 mg/dL — ABNORMAL HIGH (ref 8–23)
CO2: 22 mmol/L (ref 22–32)
Calcium: 7.8 mg/dL — ABNORMAL LOW (ref 8.9–10.3)
Chloride: 118 mmol/L — ABNORMAL HIGH (ref 98–111)
Creatinine, Ser: 1.14 mg/dL (ref 0.61–1.24)
GFR calc Af Amer: >60 mL/min (ref >60)
GFR calc non Af Amer: 58 mL/min — ABNORMAL LOW (ref >60)
Glucose, Bld: 176 mg/dL — ABNORMAL HIGH (ref 70–99)
Potassium: 3 mmol/L — ABNORMAL LOW (ref 3.5–5.1)
Sodium: 148 mmol/L — ABNORMAL HIGH (ref 135–145)
Total Bilirubin: 1.3 mg/dL — ABNORMAL HIGH (ref 0.3–1.2)
Total Protein: 6.4 g/dL — ABNORMAL LOW (ref 6.5–8.1)

## 2019-11-04 MED ORDER — TRAZODONE HCL 50 MG PO TABS: 50.0000 mg | ORAL_TABLET | Freq: Every day | ORAL | Status: DC

## 2019-11-04 MED ORDER — POTASSIUM CHLORIDE 2 MEQ/ML IV SOLN
INTRAVENOUS | Status: DC
  Administered 2019-11-04: 14:00:00 via INTRAVENOUS
  Filled 2019-11-04 (×2): qty 1000

## 2019-11-04 MED ORDER — SIMVASTATIN 40 MG PO TABS
40.0000 mg | ORAL_TABLET | Freq: Every day | ORAL | Status: DC
  Administered 2019-11-05 – 2019-11-08 (×4): 40 mg via ORAL
  Filled 2019-11-04 (×6): qty 1

## 2019-11-04 MED ORDER — DONEPEZIL HCL 5 MG PO TABS
5.0000 mg | ORAL_TABLET | Freq: Every day | ORAL | Status: DC
  Administered 2019-11-05 – 2019-11-07 (×3): 5 mg via ORAL
  Filled 2019-11-04 (×6): qty 1

## 2019-11-04 MED ORDER — INFLUENZA VAC A&B SA ADJ QUAD 0.5 ML IM PRSY
0.5000 mL | PREFILLED_SYRINGE | INTRAMUSCULAR | Status: AC
  Administered 2019-11-05: 0.5 mL via INTRAMUSCULAR
  Filled 2019-11-04: qty 0.5

## 2019-11-04 MED ORDER — TAMSULOSIN HCL 0.4 MG PO CAPS
0.4000 mg | ORAL_CAPSULE | Freq: Every day | ORAL | Status: DC
  Administered 2019-11-05 – 2019-11-09 (×5): 0.4 mg via ORAL
  Filled 2019-11-04 (×7): qty 1

## 2019-11-04 MED ORDER — GABAPENTIN 300 MG PO CAPS
300.0000 mg | ORAL_CAPSULE | Freq: Every day | ORAL | Status: DC
  Administered 2019-11-05 – 2019-11-07 (×3): 300 mg via ORAL
  Filled 2019-11-04 (×2): qty 1
  Filled 2019-11-04: qty 3
  Filled 2019-11-04: qty 1

## 2019-11-04 MED ORDER — TRAZODONE HCL 50 MG PO TABS
50.0000 mg | ORAL_TABLET | Freq: Every day | ORAL | Status: DC
  Administered 2019-11-05 – 2019-11-07 (×3): 50 mg via ORAL
  Filled 2019-11-04 (×4): qty 1

## 2019-11-04 MED ORDER — ASPIRIN EC 81 MG PO TBEC
81.0000 mg | DELAYED_RELEASE_TABLET | Freq: Every day | ORAL | Status: DC
  Administered 2019-11-05 – 2019-11-09 (×5): 81 mg via ORAL
  Filled 2019-11-04 (×7): qty 1

## 2019-11-04 MED ORDER — METOPROLOL TARTRATE 25 MG PO TABS
25.0000 mg | ORAL_TABLET | Freq: Two times a day (BID) | ORAL | Status: DC
  Administered 2019-11-05 – 2019-11-08 (×7): 25 mg via ORAL
  Filled 2019-11-04 (×11): qty 1

## 2019-11-04 MED ORDER — RISPERIDONE 0.5 MG PO TABS
0.5000 mg | ORAL_TABLET | Freq: Every day | ORAL | Status: DC
  Administered 2019-11-05 – 2019-11-08 (×4): 0.5 mg via ORAL
  Filled 2019-11-04 (×6): qty 1

## 2019-11-04 NOTE — Progress Notes (Addendum)
PROGRESS NOTE    Matthew Schultz  [id number] DOB: [birth year] DOA: 11/03/2019  PCP: Maryland Pink, MD    LOS - 1   Brief Narrative:  85 y.o. male with medical history significant of dementia, CVA with dysphagia, CAD, hypertension, type 2 diabetes who presented with decreased p.o. intake, increased thirst and altered mental status since last week.  Daughter reported patient had a new cough, increased fatigue, less interactive, but denies any fevers.  Patient was initially hypothermic in the ED, placed on bear hugger. Otherwise vitals stable.  Labs showed WBC 11.7, platelets 95k, normal lactate.  UA negative.  CT abdomen showed inflammatory changes at RUQ likely duodenitis/PUD.  CT head without acute findings, did show mild chronic bilateral maxillary sinusitis.  CXR show right lower lobe infiltrate.  He is admitted for management of likely aspiration pneumonia, treating with antibiotics and having speech therapy evaluate swallow.    Subjective 11/7: Patient seen, asleep on his left side, comfortable.  Daughter at bedside.  Patient does arouse but falls back to sleep right away.  He does not answer questions regarding symptoms or complaints.  Appears in no acute distress.  Assessment & Plan:   Principal Problem:   Right lower lobe pneumonia Active Problems:   Hypothermia   Duodenitis   Hypernatremia   Hyperlipidemia   Dementia with behavioral disturbance (HCC)   BPH (benign prostatic hyperplasia)   Right Lower Lobe Pneumonia - Aspiration vs Community Acquired  Hypothermia - resolved Patient with h/o dysphagia secondary to prior stroke and dementia.  With new cough, lethargy, hypothermic.  RLL infiltrate on CXR. - continue Rocephin and Azithromycin - follow up blood cultures - bear hugger as needed for hypothermia  Hypernatremia - Na 148, free water deficit 1.9L.  Likely due to poor PO intake over past several days.  Will give D5W with K.  Recheck BMP in AM.  Duodenitis -  continue IV protonix 40 BID for now.  Checking H pylori stool antigen.  Hyperlipidemia - continue simvastatin  Dementia - continue Aricept, Risperdal, trazadone.  Hold hydroxyzine while lethargic/AMS.   GERD - will give IV protonix given duodenitis  BPH - continue Flomax   DVT prophylaxis: Lovenox   Code Status: Full Code  Family Communication: daughter at bedside during encounter Disposition Plan:  Pending clinical improvement.  Expect d/c home which is family's preference.   Consultants:   Speech therapy  Procedures:   none  Antimicrobials:   Rocephin and azithromycin, start 11/6, stop 11/12    Objective: Vitals:   11/04/19 1300 11/04/19 1400 11/04/19 1500 11/04/19 1600  BP: (!) 127/57 (!) 117/56 126/70 120/72  Pulse: 74 68 71 68  Resp: (!) 26 12 12 15   Temp:      TempSrc:      SpO2: 97% 91% 94% 90%    Intake/Output Summary (Last 24 hours) at 11/04/2019 1634 Last data filed at 11/04/2019 0354 Gross per 24 hour  Intake 1250 ml  Output 100 ml  Net 1150 ml   There were no vitals filed for this visit.  Examination:  General exam: sleep comfortably, no acute distress Respiratory system: faint rhonchi at right base otherwise clear bilaterally, no wheezes, rales or rhonchi, normal respiratory effort. Cardiovascular system: normal S1/S2, RRR, no JVD, murmurs, rubs, gallops, no pedal edema.   Gastrointestinal system: soft, non-tender, non-distended abdomen, no organomegaly or masses felt, normal bowel sounds. Central nervous system: no gross focal neurologic deficits, exam limited by patient's altered mental status Extremities: no cyanosis,  no edema, normal tone Skin: dry, intact, normal temperature, face flushed Psychiatry: unable to evaluate at this time due to AMS   Data Reviewed: I have personally reviewed following labs and imaging studies  CBC: Recent Labs  Lab 11/03/19 1239 11/04/19 0351  WBC 11.7* 11.0*  NEUTROABS 9.6*  --   HGB 15.8 13.4  HCT  47.6 40.9  MCV 88.0 87.2  PLT 95* 123XX123*   Basic Metabolic Panel: Recent Labs  Lab 11/03/19 1239 11/04/19 0351  NA 142 148*  K 4.6 3.0*  CL 109 118*  CO2 22 22  GLUCOSE 301* 176*  BUN 45* 35*  CREATININE 1.18 1.14  CALCIUM 8.2* 7.8*   GFR: CrCl cannot be calculated (Unknown ideal weight.). Liver Function Tests: Recent Labs  Lab 11/03/19 1239 11/04/19 0351  AST 22 13*  ALT 19 15  ALKPHOS 115 94  BILITOT 2.2* 1.3*  PROT 7.3 6.4*  ALBUMIN 3.4* 2.8*   Recent Labs  Lab 11/03/19 1239  LIPASE 53*   No results for input(s): AMMONIA in the last 168 hours. Coagulation Profile: Recent Labs  Lab 11/03/19 1337  INR 1.1   Cardiac Enzymes: No results for input(s): CKTOTAL, CKMB, CKMBINDEX, TROPONINI in the last 168 hours. BNP (last 3 results) No results for input(s): PROBNP in the last 8760 hours. HbA1C: No results for input(s): HGBA1C in the last 72 hours. CBG: Recent Labs  Lab 11/03/19 1228 11/03/19 1621  GLUCAP 267* 241*   Lipid Profile: No results for input(s): CHOL, HDL, LDLCALC, TRIG, CHOLHDL, LDLDIRECT in the last 72 hours. Thyroid Function Tests: Recent Labs    11/03/19 1811  TSH 2.411   Anemia Panel: No results for input(s): VITAMINB12, FOLATE, FERRITIN, TIBC, IRON, RETICCTPCT in the last 72 hours. Sepsis Labs: Recent Labs  Lab 11/03/19 1239 11/03/19 1811  LATICACIDVEN 1.9 1.0    Recent Results (from the past 240 hour(s))  SARS CORONAVIRUS 2 (TAT 6-24 HRS) Nasopharyngeal Nasopharyngeal Swab     Status: None   Collection Time: 11/03/19 12:39 PM   Specimen: Nasopharyngeal Swab  Result Value Ref Range Status   SARS Coronavirus 2 NEGATIVE NEGATIVE Final    Comment: (NOTE) SARS-CoV-2 target nucleic acids are NOT DETECTED. The SARS-CoV-2 RNA is generally detectable in upper and lower respiratory specimens during the acute phase of infection. Negative results do not preclude SARS-CoV-2 infection, do not rule out co-infections with other  pathogens, and should not be used as the sole basis for treatment or other patient management decisions. Negative results must be combined with clinical observations, patient history, and epidemiological information. The expected result is Negative. Fact Sheet for Patients: SugarRoll.be Fact Sheet for Healthcare Providers: https://www.woods-mathews.com/ This test is not yet approved or cleared by the [place] FDA and  has been authorized for detection and/or diagnosis of SARS-CoV-2 by FDA under an Emergency Use Authorization (EUA). This EUA will remain  in effect (meaning this test can be used) for the duration of the COVID-19 declaration under Section 56 4(b)(1) of the Act, 21 U.S.C. section 360bbb-3(b)(1), unless the authorization is terminated or revoked sooner. Performed at [facility], [place] [address], [place], [place] [address]          Radiology Studies: Dg Chest 1 View  Result Date: 11/03/2019 CLINICAL DATA:  85 year old male with a history of weakness EXAM: CHEST  1 VIEW COMPARISON:  09/25/2018 FINDINGS: Cardiomediastinal silhouette likely unchanged in size and contour with left rotation of the patient. Reticulonodular opacities of the lungs new  from the prior. No pneumothorax. No large pleural effusion. Low lung volumes persist. No displaced fracture IMPRESSION: Reticulonodular opacities of the lungs with low lung volumes may reflect atelectasis and chronic changes, however, atypical infection not excluded. Electronically Signed   By: Corrie Mckusick D.O.   On: 11/03/2019 14:06   Ct Head Wo Contrast  Result Date: 11/03/2019 CLINICAL DATA:  Increased weakness over the past several days with poor p.o. intake for the past 12 hours. EXAM: CT HEAD WITHOUT CONTRAST TECHNIQUE: Contiguous axial images were obtained from the base of the skull through the vertex without intravenous contrast. COMPARISON:  09/25/2018. FINDINGS: Brain:  Moderate to marked enlargement of the ventricles and subarachnoid spaces with mild progression. Stable moderate patchy white matter low density in both cerebral hemispheres. No intracranial hemorrhage, mass lesion or CT evidence of acute infarction. Vascular: No hyperdense vessel or unexpected calcification. Skull: Normal. Negative for fracture or focal lesion. Sinuses/Orbits: Status post bilateral cataract extraction. Mild bilateral maxillary sinus mucosal thickening. Other: None. IMPRESSION: 1. No acute abnormality. 2. Moderate to marked diffuse cerebral and cerebellar atrophy with mild progression. 3. Stable moderate chronic small vessel white matter ischemic changes in both cerebral hemispheres. 4. Mild chronic bilateral maxillary sinusitis. Electronically Signed   By: Claudie Revering M.D.   On: 11/03/2019 16:18   Ct Abdomen Pelvis W Contrast  Result Date: 11/03/2019 CLINICAL DATA:  Increased weakness over the past few days with poor p.o. intake for the past 12 hours. EXAM: CT ABDOMEN AND PELVIS WITH CONTRAST TECHNIQUE: Multidetector CT imaging of the abdomen and pelvis was performed using the standard protocol following bolus administration of intravenous contrast. CONTRAST:  15mL OMNIPAQUE IOHEXOL 300 MG/ML  SOLN COMPARISON:  09/25/2018. FINDINGS: Lower chest: Interval small right pleural effusion and right lower lobe airspace consolidation. Interval mild left lower lobe airspace consolidation without pleural fluid. Stable mildly enlarged heart. Atheromatous coronary artery calcifications. Hepatobiliary: Mild diffuse gallbladder wall thickening and enhancement with mild progression since the previous examination, with decreased distention of the gallbladder. Small amount of pericholecystic edema or fluid. No visible gallstones. Interval small amount of biliary air with resolution of the previously demonstrated intrahepatic and extrahepatic ductal dilatation. Interval ERCP and stone removal. Pancreas:  Interval mild soft tissue stranding in the pancreatic head and adjacent tissues. More pronounced soft tissue stranding more superiorly, at the level of the 2nd portion of the duodenum. Spleen: Normal in size without focal abnormality. Adrenals/Urinary Tract: The previously demonstrated 2.0 cm low density right adrenal mass measures 2.0 cm on image number 29 series 2. Normal appearing left adrenal gland. Again noted is a right pelvic kidney containing adjacent 4 mm and 3 mm calculi. Also again demonstrated are parenchymal and possible parapelvic renal cysts on the right, difficult to distinguish from collecting system dilatation. Normally positioned left kidney with multiple smaller cysts. Mild diffuse bladder wall thickening with 2 tiny diverticula laterally on the right. No bladder, left kidney or left ureteral calculi. Stomach/Bowel: Multiple sigmoid colon diverticula. Normal appearing appendix. Moderate diffuse wall thickening and enhancement involving the 2nd portion of the duodenum with extensive period duodenal soft tissue stranding. There is also mild-to-moderate dilatation of the proximal 3rd portion of the duodenum, filled with fluid. No obstructing mass visualized. Mild diffuse antral wall thickening. Vascular/Lymphatic: Atheromatous arterial calcifications without aneurysm. No enlarged lymph nodes. Reproductive: Minimally enlarged prostate gland. Other: Small bilateral inguinal hernias containing fat and very small umbilical hernia containing fat. Musculoskeletal: Mild to moderate levoconvex lumbar scoliosis. Stable lumbar spine degenerative  changes and old L3 superior endplate compression deformity. IMPRESSION: 1. Interval extensive inflammatory changes in the right upper quadrant of the abdomen, centered at the level of the proximal 2nd portion of the duodenum. This is most compatible with marked duodenitis/ulcer disease. No evidence of perforation. 2. Mild inflammatory changes involving the head of the  pancreas, most likely secondary to the inflammation involving the duodenum. Acute pancreatitis is less likely. 3. Mild inflammatory changes involving the gallbladder, also most likely secondary to the inflammation involving the duodenum. Mild changes of acute cholecystitis are less likely. 4. Interval small right pleural effusion and right lower lobe airspace atelectasis or pneumonia. 5. Interval mild left lower lobe airspace atelectasis or pneumonia. 6. Interval small amount of biliary air with resolution of the previously demonstrated intrahepatic and extrahepatic ductal dilatation, compatible with interval common duct stone extraction and sphincterotomy. 7. Stable right pelvic kidney containing adjacent 4 mm and 3 mm calculi. 8. Stable probable combination of marked right hydronephrosis and renal cyst formation, most likely due to chronic UPJ obstruction. 9. Mild diffuse urinary bladder wall thickening with 2 tiny diverticula laterally on the right. This could be due to chronic bladder outlet obstruction by the enlarged prostate gland or chronic cystitis. 10. Sigmoid diverticulosis. Electronically Signed   By: Claudie Revering M.D.   On: 11/03/2019 16:37        Scheduled Meds: . aspirin EC  81 mg Oral Daily  . diphenhydrAMINE  12.5 mg Intravenous Once  . donepezil  5 mg Oral QHS  . enoxaparin (LOVENOX) injection  40 mg Subcutaneous Q24H  . gabapentin  300 mg Oral QHS  . metoprolol tartrate  25 mg Oral BID  . pantoprazole (PROTONIX) IV  40 mg Intravenous Q12H  . risperiDONE  0.5 mg Oral QHS  . simvastatin  40 mg Oral QHS  . tamsulosin  0.4 mg Oral Daily  . traZODone  50 mg Oral QHS   Continuous Infusions: . azithromycin 500 mg (11/04/19 1605)  . cefTRIAXone (ROCEPHIN)  IV Stopped (11/04/19 0038)  . dextrose 5 % with kcl 100 mL/hr at 11/04/19 1330     LOS: 1 day    Time spent: 30-35 minutes    Ezekiel Slocumb, DO [facility] Pager: [phone]  If 7PM-7AM, please contact  night-coverage [email or web address] Password [facility] 11/04/2019, 4:34 PM

## 2019-11-04 NOTE — ED Notes (Signed)
Speech therapy at bedside.

## 2019-11-04 NOTE — ED Notes (Signed)
Placed new condom catheter on Pt.

## 2019-11-04 NOTE — ED Notes (Signed)
Pt sleeping. Condom cath in place. Equal rise and fall of chest noted. NAD. VSS.  Unlabored.  Fall alarm under patient turned on.

## 2019-11-04 NOTE — ED Notes (Signed)
Pt remains asleep. Will evaluate when awake. Pt is NPO for speech eval.

## 2019-11-04 NOTE — ED Notes (Signed)
Attempted to call floor to give report to floor nurse and they were unable to take at this time.

## 2019-11-04 NOTE — ED Notes (Signed)
Condom cath came off.  Urine in bed. Pt cleaned and new sheets/gown/brief placed.

## 2019-11-04 NOTE — ED Notes (Addendum)
Daughter at bedside. Updated family.  Admitting MD also at bedside. Contacted speech therapy and requested eval be done while in ED as pt has been NPO and morning meds held.  Speech will be down shortly for eval.  Daughter reports he does cough when drinks at home.

## 2019-11-04 NOTE — Progress Notes (Signed)
Spoke with family over phone to complete admission questions.   Fuller Mandril, RN

## 2019-11-04 NOTE — ED Notes (Signed)
Spoke with dr Arbutus Ped about NPO status and meds.  Will hold AM meds until swallow eval is complete.

## 2019-11-04 NOTE — Evaluation (Signed)
Clinical/Bedside Swallow Evaluation Patient Details  Name: Matthew Schultz MRN: [id number] Date of Birth: 1932/02/01  Today's Date: 11/04/2019 Time: SLP Start Time (ACUTE ONLY): 1400 SLP Stop Time (ACUTE ONLY): 1500 SLP Time Calculation (min) (ACUTE ONLY): 60 min  Past Medical History:  Past Medical History:  Diagnosis Date  . Arthritis   . BPH (benign prostatic hyperplasia)   . CAD (coronary artery disease)   . DM (diabetes mellitus) ([place])   . HLD (hyperlipidemia)   . HTN (hypertension)   . Hydronephrosis   . Incomplete bladder emptying   . OA (osteoarthritis)   . Renal mass   . Stroke [facility])    Past Surgical History:  Past Surgical History:  Procedure Laterality Date  . BACK SURGERY    . ERCP N/A 09/27/2018   Procedure: ENDOSCOPIC RETROGRADE CHOLANGIOPANCREATOGRAPHY (ERCP);  Surgeon: Clarene Essex, MD;  Location: [facility];  Service: Endoscopy;  Laterality: N/A;  . REMOVAL OF STONES  09/27/2018   Procedure: REMOVAL OF STONES;  Surgeon: Clarene Essex, MD;  Location: [facility];  Service: Endoscopy;;  . SPHINCTEROTOMY  09/27/2018   Procedure: Joan Mayans;  Surgeon: Clarene Essex, MD;  Location: [facility] ENDOSCOPY;  Service: Endoscopy;;   HPI:  Pt is a 85 y.o. male with medical history significant of Dementia, CVA with dysphagia, CAD, hypertension, type 2 diabetes who presents with concerns of decreased p.o. intake, increased thirst and altered mental status since last week.  Daughter reports that patient presented to his primary physician yesterday and had lab work drawn.  Lab work resulted today with hyperglycemia and they were asked to present to the ED for further evaluation. He was initially hypothermic with temperature down to 93.6 with improvement of temperature up to 96 after being placed on Bair hugger. He is on room air.  Dtr stated she has noticed more coughing recently w/ increased fatigue and has been communicating less than usual w/ the family. Dtr stated she was aware of the  MBSS last Oct. 2019 but that pt did not have any f/u after the study nor did he change in his liquids at home -- he has been drinking Thin liquids in his diet only.  CXR: opacities of the lungs with low lung volume; Head CT: Moderate to marked diffuse cerebral and cerebellar atrophy with progression.   Assessment / Plan / Recommendation Clinical Impression  Pt appears to present w/ oropharyngeal phase dysphagia most impacted by his declined Alertness and Awareness to task of oral intake/swallowing -- this increases risk for aspiration, choking thus impact on Pulmonary status. Pt current presentation is also impacted by his Baseline Cognitive decline/Dementia, and current illness. He is currently receiving IV fluids and antibiotics for support. Due to the lethargy and poor awareness, minimal po trials were given w/ strict aspiration precautions. Pt consumed trials of Nectar liquids and Puree given Mod+ verbal/tactile stim (spoon placed at lips to open mouth). Pt opened mouth and accepted boluses but then exhibited min oral holding, delayed A-P transfer for swallowing. Given time and verbal cues (and strict monitoring), pt demonstrated a pharyngeal swallow w/ oral clearing checked by SLP each trial. Noted min lingual sweeping and licking of lips intermittently w/ po trials; no anterior spillage noted. OM exam was quite limited d/t Cognitive status and poor alertness, but no gross unilateral oral weakness noted. Pt required full feeding support.  D/t pt's current presentation and high risk for aspiration, recommend continued NPO status until pt demonstrates an [place] for engagement in oral intake, safely. Pt  has a baseline of Dysphagia per MBSS in 09/2018 so a Dysphagia level 1 (puree) w/ Nectar liquids would be recommended as the initial diet consistency if MD feels pt is more Alert tomorrow -- NSG to provide strict Supervision for aspiration precautions. Pills Crushed in Puree could be considered per  NSG discretion today. ST services will f/u w/ pt while admitted. Much education given to Daughter present in ED room. NSG and MD updated on POC for oral intake/NPO status/meds currently, agreed.  SLP Visit Diagnosis: Dysphagia, oropharyngeal phase (R13.12)(baseline Dementia)    Aspiration Risk  Moderate aspiration risk;Risk for inadequate nutrition/hydration(currently)    Diet Recommendation  continue NPO today w/ ongoing assessment of pt's Alertness - MD to assess tomorrow and may initiate an oral diet(TBD). The recommended initial diet consistency would be a Dysphagia level 1 (puree) w/ Nectar consistency liquids; strict aspiration precautions; feeding support and Supervision w/ ALL oral intake. Do NOT give po's unless pt is FULLY awake/alert to engage safely.   Medication Administration: Crushed with puree(as able --- when fully alert/awake to participate)    Other  Recommendations Recommended Consults: (Dietician f/u) Oral Care Recommendations: Oral care BID;Oral care before and after PO;Staff/trained caregiver to provide oral care Other Recommendations: Order thickener from pharmacy;Prohibited food (jello, ice cream, thin soups);Remove water pitcher;Have oral suction available(when initiates a diet)   Follow up Recommendations (TBD)      Frequency and Duration min 3x week  2 weeks       Prognosis Prognosis for Safe Diet Advancement: Fair Barriers to Reach Goals: Cognitive deficits;Time post onset;Severity of deficits(baseline Dysphagia)      Swallow Study   General Date of Onset: 11/03/19 HPI: Pt is a 85 y.o. male with medical history significant of Dementia, CVA with dysphagia, CAD, hypertension, type 2 diabetes who presents with concerns of decreased p.o. intake, increased thirst and altered mental status since last week.  Daughter reports that patient presented to his primary physician yesterday and had lab work drawn.  Lab work resulted today with hyperglycemia and they were  asked to present to the ED for further evaluation. He was initially hypothermic with temperature down to 93.6 with improvement of temperature up to 96 after being placed on Bair hugger. He is on room air.  Dtr stated she has noticed more coughing recently w/ increased fatigue and has been communicating less than usual w/ the family. Dtr stated she was aware of the MBSS last Oct. 2019 but that pt did not have any f/u after the study nor did he change in his liquids at home -- he has been drinking Thin liquids in his diet only.  CXR: opacities of the lungs with low lung volume; Head CT: Moderate to marked diffuse cerebral and cerebellar atrophy with progression. Type of Study: Bedside Swallow Evaluation Previous Swallow Assessment: MBSS in 09/2018 Diet Prior to this Study: NPO Temperature Spikes Noted: No(wbc 11.0; Na elevated) Respiratory Status: Room air History of Recent Intubation: No Behavior/Cognition: Cooperative;Confused;Agitated;Lethargic/Drowsy;Requires cueing;Doesn't follow directions(eyes closed) Oral Cavity Assessment: Dry;Dried secretions(could not assess d/t Cognitive status; dryness on lips) Oral Care Completed by SLP: Recent completion by staff(ED) Oral Cavity - Dentition: Missing dentition Vision: (n/a) Self-Feeding Abilities: Total assist Patient Positioning: Upright in bed(needed positioning) Baseline Vocal Quality: (did not speak; few phonations) Volitional Cough: Cognitively unable to elicit Volitional Swallow: Unable to elicit    Oral/Motor/Sensory Function Overall Oral Motor/Sensory Function: (grossly WFL w/ no gross unilateral weakness overtly noted)   Ice Chips Ice chips: Not tested  Thin Liquid Thin Liquid: Not tested    Nectar Thick Nectar Thick Liquid: Impaired Presentation: Spoon(fed; 10 trials) Oral Phase Impairments: Reduced labial seal;Poor awareness of bolus Oral phase functional implications: Prolonged oral transit Pharyngeal Phase Impairments: Suspected  delayed Swallow(no overt clinical s/s ) Other Comments: verbal cues given   Honey Thick Honey Thick Liquid: Not tested   Puree Puree: Impaired Presentation: Spoon(fed; 8 trials) Oral Phase Impairments: Reduced labial seal;Reduced lingual movement/coordination;Poor awareness of bolus Oral Phase Functional Implications: Prolonged oral transit;Oral holding(min - ~20 secs) Pharyngeal Phase Impairments: Suspected delayed Swallow(no overt clinical s/s)   Solid     Solid: Not tested       Orinda Kenner, MS, CCC-SLP Jamaurie Bernier 11/04/2019,3:49 PM

## 2019-11-05 DIAGNOSIS — E870 Hyperosmolality and hypernatremia: Secondary | ICD-10-CM

## 2019-11-05 LAB — GLUCOSE, CAPILLARY
Glucose-Capillary: 182 mg/dL — ABNORMAL HIGH (ref 70–99)
Glucose-Capillary: 136 mg/dL — ABNORMAL HIGH (ref 70–99)
Glucose-Capillary: 128 mg/dL — ABNORMAL HIGH (ref 70–99)
Glucose-Capillary: 124 mg/dL — ABNORMAL HIGH (ref 70–99)

## 2019-11-05 LAB — CBC WITH DIFFERENTIAL/PLATELET
Abs Immature Granulocytes: 0.09 10*3/uL — ABNORMAL HIGH (ref 0.00–0.07)
Basophils Absolute: 0 10*3/uL (ref 0.0–0.1)
Basophils Relative: 0 %
Eosinophils Absolute: 0.1 10*3/uL (ref 0.0–0.5)
Eosinophils Relative: 1 %
HCT: 39.4 % (ref 39.0–52.0)
Hemoglobin: 13.4 g/dL (ref 13.0–17.0)
Immature Granulocytes: 1 %
Lymphocytes Relative: 16 %
Lymphs Abs: 1.2 10*3/uL (ref 0.7–4.0)
MCH: 28.8 pg (ref 26.0–34.0)
MCHC: 34 g/dL (ref 30.0–36.0)
MCV: 84.7 fL (ref 80.0–100.0)
Monocytes Absolute: 0.9 10*3/uL (ref 0.1–1.0)
Monocytes Relative: 12 %
Neutro Abs: 5.2 10*3/uL (ref 1.7–7.7)
Neutrophils Relative %: 70 %
Platelets: 96 10*3/uL — ABNORMAL LOW (ref 150–400)
RBC: 4.65 MIL/uL (ref 4.22–5.81)
RDW: 14.1 % (ref 11.5–15.5)
WBC: 7.5 10*3/uL (ref 4.0–10.5)
nRBC: 0 % (ref 0.0–0.2)

## 2019-11-05 LAB — BASIC METABOLIC PANEL
Anion gap: 9 (ref 5–15)
BUN: 25 mg/dL — ABNORMAL HIGH (ref 8–23)
CO2: 21 mmol/L — ABNORMAL LOW (ref 22–32)
Calcium: 7.8 mg/dL — ABNORMAL LOW (ref 8.9–10.3)
Chloride: 112 mmol/L — ABNORMAL HIGH (ref 98–111)
Creatinine, Ser: 1.02 mg/dL (ref 0.61–1.24)
GFR calc Af Amer: >60 mL/min (ref >60)
GFR calc non Af Amer: >60 mL/min (ref >60)
Glucose, Bld: 215 mg/dL — ABNORMAL HIGH (ref 70–99)
Potassium: 3.2 mmol/L — ABNORMAL LOW (ref 3.5–5.1)
Sodium: 142 mmol/L (ref 135–145)

## 2019-11-05 LAB — HEMOGLOBIN A1C
Hgb A1c MFr Bld: 10.9 % — ABNORMAL HIGH (ref 4.8–5.6)
Mean Plasma Glucose: 266.13 mg/dL

## 2019-11-05 MED ORDER — ACETAMINOPHEN 325 MG PO TABS
650.0000 mg | ORAL_TABLET | Freq: Four times a day (QID) | ORAL | Status: DC | PRN
  Administered 2019-11-05: 650 mg via ORAL
  Filled 2019-11-05: qty 2

## 2019-11-05 MED ORDER — POTASSIUM CHLORIDE 10 MEQ/100ML IV SOLN
10.0000 meq | INTRAVENOUS | Status: AC
  Administered 2019-11-05 (×3): 10 meq via INTRAVENOUS
  Filled 2019-11-05 (×2): qty 100

## 2019-11-05 MED ORDER — SODIUM CHLORIDE 0.9 % IV SOLN
INTRAVENOUS | Status: AC
  Administered 2019-11-05 – 2019-11-06 (×2): via INTRAVENOUS

## 2019-11-05 MED ORDER — POTASSIUM CHLORIDE 10 MEQ/100ML IV SOLN
10.0000 meq | INTRAVENOUS | Status: AC
  Administered 2019-11-05 (×3): 10 meq via INTRAVENOUS
  Filled 2019-11-05 (×5): qty 100

## 2019-11-05 MED ORDER — INSULIN ASPART 100 UNIT/ML ~~LOC~~ SOLN
0.0000 [IU] | Freq: Three times a day (TID) | SUBCUTANEOUS | Status: DC
  Administered 2019-11-05: 1 [IU] via SUBCUTANEOUS
  Administered 2019-11-05 – 2019-11-06 (×2): 2 [IU] via SUBCUTANEOUS
  Administered 2019-11-06 – 2019-11-07 (×2): 1 [IU] via SUBCUTANEOUS
  Administered 2019-11-07 (×2): 3 [IU] via SUBCUTANEOUS
  Administered 2019-11-08 (×2): 1 [IU] via SUBCUTANEOUS
  Administered 2019-11-08: 2 [IU] via SUBCUTANEOUS
  Filled 2019-11-05 (×10): qty 1

## 2019-11-05 MED ORDER — DEXTROSE 5 % IV SOLN
INTRAVENOUS | Status: AC
  Filled 2019-11-05: qty 1000

## 2019-11-05 MED ORDER — ACETAMINOPHEN 650 MG RE SUPP: 650.0000 mg | RECTAL | Status: DC | PRN

## 2019-11-05 NOTE — Progress Notes (Signed)
Patient alert but not eating and drinking as well as he did for lunch. Holding liquids and food in his mouth without attempting to swallow.

## 2019-11-05 NOTE — Progress Notes (Signed)
PROGRESS NOTE    Matthew Schultz  [id number] DOB: 1932-06-16 DOA: 11/03/2019  PCP: Maryland Pink, MD    LOS - 2   Brief Narrative:  85 y.o.malewith medical history significant ofdementia, CVA with dysphagia, CAD, hypertension, type 2 diabetes who presented with decreased p.o. intake, increased thirst and altered mental status since last week. Daughter reported patient had a new cough, increased fatigue, less interactive, but denies any fevers. Patient was initially hypothermic in the ED, placed on bear hugger. Otherwise vitals stable.  Labs showed WBC 11.7, platelets 95k, normal lactate.  UA negative.  CT abdomen showed inflammatory changes at RUQ likely duodenitis/PUD.  CT head without acute findings, did show mild chronic bilateral maxillary sinusitis.  CXR show right lower lobe infiltrate.  He is admitted for management of likely aspiration pneumonia, treating with antibiotics and having speech therapy evaluate swallow.    Subjective 11/8: Patient seen and examined, sleeping comfortably.  Per nursing was awake and interactive earlier in the morning.  Nursing staff reported some agitation overnight but none this morning.  Otherwise no acute events reported.  Assessment & Plan:   Principal Problem:   Right lower lobe pneumonia Active Problems:   Hypothermia   Duodenitis   Hypernatremia   Hyperlipidemia   Dementia with behavioral disturbance (HCC)   BPH (benign prostatic hyperplasia)  Right Lower Lobe Pneumonia - Aspiration vs Community Acquired  Hypothermia - resolved Patient with h/o dysphagia secondary to prior stroke and dementia.  With new cough, lethargy, hypothermic.  RLL infiltrate on CXR. - continue Rocephin and Azithromycin - follow up blood cultures - bear hugger as needed for hypothermia  Dysphagia -speech therapy following, case discussed 11/7.  Patient's level of alertness waxes and wanes, discussed with nursing to assist with feeds, only feed when patient  is awake and alert.  Pured dysphagia 1 diet with nectar thickened liquids ordered.  Hypernatremia -resolved  with D5W Na 148, free water deficit 1.9L.  Likely due to poor PO intake over past several days.  Recheck BMP in AM.  Duodenitis - continue IV protonix 40 BID for now.  Checking H pylori stool antigen.  Hyperlipidemia - continue simvastatin  Dementia - continue Aricept, Risperdal, trazadone.  Hold hydroxyzine while lethargic/AMS.   GERD - will give IV protonix given duodenitis  BPH - continue Flomax   DVT prophylaxis: Lovenox   Code Status: Full Code  Family Communication:  At bedside Disposition Plan:  Pending clinical improvement.  Expect d/c home which is family's preference.   Consultants:   Speech therapy  Procedures:   none  Antimicrobials:   Rocephin and azithromycin, start 11/6, stop 11/12    Objective: Vitals:   11/04/19 1600 11/04/19 1837 11/04/19 2055 11/05/19 0432  BP: 120/72 134/80 130/64 117/64  Pulse: 68 71 69 64  Resp: 15  20 20   Temp:  98.6 F (37 C) 98.2 F (36.8 C) 99 F (37.2 C)  TempSrc:  Oral Oral Oral  SpO2: 90% 94% 94% 96%    Intake/Output Summary (Last 24 hours) at 11/05/2019 0800 Last data filed at 11/05/2019 0539 Gross per 24 hour  Intake 250 ml  Output 200 ml  Net 50 ml   There were no vitals filed for this visit.  Examination:  General exam: Sleeping comfortably arouse to voice, no acute distress Respiratory system: Decreased breath sounds at the right base, otherwise clear, no wheezes, rales or rhonchi, normal respiratory effort. Cardiovascular system: normal S1/S2, RRR, no JVD, murmurs, rubs, gallops, no pedal  edema.   Gastrointestinal system: soft, non-tender, non-distended abdomen, no organomegaly or masses felt, normal bowel sounds. Extremities: No cyanosis, no edema, normal tone Skin: dry, intact, normal temperature   Data Reviewed: I have personally reviewed following labs and imaging studies   CBC: Recent Labs  Lab 11/03/19 1239 11/04/19 0351 11/05/19 0602  WBC 11.7* 11.0* 7.5  NEUTROABS 9.6*  --  5.2  HGB 15.8 13.4 13.4  HCT 47.6 40.9 39.4  MCV 88.0 87.2 84.7  PLT 95* 100* 96*   Basic Metabolic Panel: Recent Labs  Lab 11/03/19 1239 11/04/19 0351 11/05/19 0602  NA 142 148* 142  K 4.6 3.0* 3.2*  CL 109 118* 112*  CO2 22 22 21*  GLUCOSE 301* 176* 215*  BUN 45* 35* 25*  CREATININE 1.18 1.14 1.02  CALCIUM 8.2* 7.8* 7.8*   GFR: CrCl cannot be calculated (Unknown ideal weight.). Liver Function Tests: Recent Labs  Lab 11/03/19 1239 11/04/19 0351  AST 22 13*  ALT 19 15  ALKPHOS 115 94  BILITOT 2.2* 1.3*  PROT 7.3 6.4*  ALBUMIN 3.4* 2.8*   Recent Labs  Lab 11/03/19 1239  LIPASE 53*   No results for input(s): AMMONIA in the last 168 hours. Coagulation Profile: Recent Labs  Lab 11/03/19 1337  INR 1.1   Cardiac Enzymes: No results for input(s): CKTOTAL, CKMB, CKMBINDEX, TROPONINI in the last 168 hours. BNP (last 3 results) No results for input(s): PROBNP in the last 8760 hours. HbA1C: No results for input(s): HGBA1C in the last 72 hours. CBG: Recent Labs  Lab 11/03/19 1228 11/03/19 1621  GLUCAP 267* 241*   Lipid Profile: No results for input(s): CHOL, HDL, LDLCALC, TRIG, CHOLHDL, LDLDIRECT in the last 72 hours. Thyroid Function Tests: Recent Labs    11/03/19 1811  TSH 2.411   Anemia Panel: No results for input(s): VITAMINB12, FOLATE, FERRITIN, TIBC, IRON, RETICCTPCT in the last 72 hours. Sepsis Labs: Recent Labs  Lab 11/03/19 1239 11/03/19 1811  LATICACIDVEN 1.9 1.0    Recent Results (from the past 240 hour(s))  SARS CORONAVIRUS 2 (TAT 6-24 HRS) Nasopharyngeal Nasopharyngeal Swab     Status: None   Collection Time: 11/03/19 12:39 PM   Specimen: Nasopharyngeal Swab  Result Value Ref Range Status   SARS Coronavirus 2 NEGATIVE NEGATIVE Final    Comment: (NOTE) SARS-CoV-2 target nucleic acids are NOT DETECTED. The SARS-CoV-2 RNA  is generally detectable in upper and lower respiratory specimens during the acute phase of infection. Negative results do not preclude SARS-CoV-2 infection, do not rule out co-infections with other pathogens, and should not be used as the sole basis for treatment or other patient management decisions. Negative results must be combined with clinical observations, patient history, and epidemiological information. The expected result is Negative. Fact Sheet for Patients: SugarRoll.be Fact Sheet for Healthcare Providers: https://www.woods-mathews.com/ This test is not yet approved or cleared by the [place] FDA and  has been authorized for detection and/or diagnosis of SARS-CoV-2 by FDA under an Emergency Use Authorization (EUA). This EUA will remain  in effect (meaning this test can be used) for the duration of the COVID-19 declaration under Section 56 4(b)(1) of the Act, 21 U.S.C. section 360bbb-3(b)(1), unless the authorization is terminated or revoked sooner. Performed at [facility], [place] [address], [place], [place] [address]          Radiology Studies: Dg Chest 1 View  Result Date: 11/03/2019 CLINICAL DATA:  85 year old male with a history of weakness EXAM: CHEST  1 VIEW COMPARISON:  09/25/2018 FINDINGS: Cardiomediastinal silhouette likely unchanged in size and contour with left rotation of the patient. Reticulonodular opacities of the lungs new from the prior. No pneumothorax. No large pleural effusion. Low lung volumes persist. No displaced fracture IMPRESSION: Reticulonodular opacities of the lungs with low lung volumes may reflect atelectasis and chronic changes, however, atypical infection not excluded. Electronically Signed   By: Corrie Mckusick D.O.   On: 11/03/2019 14:06   Ct Head Wo Contrast  Result Date: 11/03/2019 CLINICAL DATA:  Increased weakness over the past several days with poor p.o. intake for the past 12 hours.  EXAM: CT HEAD WITHOUT CONTRAST TECHNIQUE: Contiguous axial images were obtained from the base of the skull through the vertex without intravenous contrast. COMPARISON:  09/25/2018. FINDINGS: Brain: Moderate to marked enlargement of the ventricles and subarachnoid spaces with mild progression. Stable moderate patchy white matter low density in both cerebral hemispheres. No intracranial hemorrhage, mass lesion or CT evidence of acute infarction. Vascular: No hyperdense vessel or unexpected calcification. Skull: Normal. Negative for fracture or focal lesion. Sinuses/Orbits: Status post bilateral cataract extraction. Mild bilateral maxillary sinus mucosal thickening. Other: None. IMPRESSION: 1. No acute abnormality. 2. Moderate to marked diffuse cerebral and cerebellar atrophy with mild progression. 3. Stable moderate chronic small vessel white matter ischemic changes in both cerebral hemispheres. 4. Mild chronic bilateral maxillary sinusitis. Electronically Signed   By: Claudie Revering M.D.   On: 11/03/2019 16:18   Ct Abdomen Pelvis W Contrast  Result Date: 11/03/2019 CLINICAL DATA:  Increased weakness over the past few days with poor p.o. intake for the past 12 hours. EXAM: CT ABDOMEN AND PELVIS WITH CONTRAST TECHNIQUE: Multidetector CT imaging of the abdomen and pelvis was performed using the standard protocol following bolus administration of intravenous contrast. CONTRAST:  110mL OMNIPAQUE IOHEXOL 300 MG/ML  SOLN COMPARISON:  09/25/2018. FINDINGS: Lower chest: Interval small right pleural effusion and right lower lobe airspace consolidation. Interval mild left lower lobe airspace consolidation without pleural fluid. Stable mildly enlarged heart. Atheromatous coronary artery calcifications. Hepatobiliary: Mild diffuse gallbladder wall thickening and enhancement with mild progression since the previous examination, with decreased distention of the gallbladder. Small amount of pericholecystic edema or fluid. No  visible gallstones. Interval small amount of biliary air with resolution of the previously demonstrated intrahepatic and extrahepatic ductal dilatation. Interval ERCP and stone removal. Pancreas: Interval mild soft tissue stranding in the pancreatic head and adjacent tissues. More pronounced soft tissue stranding more superiorly, at the level of the 2nd portion of the duodenum. Spleen: Normal in size without focal abnormality. Adrenals/Urinary Tract: The previously demonstrated 2.0 cm low density right adrenal mass measures 2.0 cm on image number 29 series 2. Normal appearing left adrenal gland. Again noted is a right pelvic kidney containing adjacent 4 mm and 3 mm calculi. Also again demonstrated are parenchymal and possible parapelvic renal cysts on the right, difficult to distinguish from collecting system dilatation. Normally positioned left kidney with multiple smaller cysts. Mild diffuse bladder wall thickening with 2 tiny diverticula laterally on the right. No bladder, left kidney or left ureteral calculi. Stomach/Bowel: Multiple sigmoid colon diverticula. Normal appearing appendix. Moderate diffuse wall thickening and enhancement involving the 2nd portion of the duodenum with extensive period duodenal soft tissue stranding. There is also mild-to-moderate dilatation of the proximal 3rd portion of the duodenum, filled with fluid. No obstructing mass visualized. Mild diffuse antral wall thickening. Vascular/Lymphatic: Atheromatous arterial calcifications without aneurysm. No enlarged lymph nodes. Reproductive: Minimally enlarged prostate  gland. Other: Small bilateral inguinal hernias containing fat and very small umbilical hernia containing fat. Musculoskeletal: Mild to moderate levoconvex lumbar scoliosis. Stable lumbar spine degenerative changes and old L3 superior endplate compression deformity. IMPRESSION: 1. Interval extensive inflammatory changes in the right upper quadrant of the abdomen, centered at the  level of the proximal 2nd portion of the duodenum. This is most compatible with marked duodenitis/ulcer disease. No evidence of perforation. 2. Mild inflammatory changes involving the head of the pancreas, most likely secondary to the inflammation involving the duodenum. Acute pancreatitis is less likely. 3. Mild inflammatory changes involving the gallbladder, also most likely secondary to the inflammation involving the duodenum. Mild changes of acute cholecystitis are less likely. 4. Interval small right pleural effusion and right lower lobe airspace atelectasis or pneumonia. 5. Interval mild left lower lobe airspace atelectasis or pneumonia. 6. Interval small amount of biliary air with resolution of the previously demonstrated intrahepatic and extrahepatic ductal dilatation, compatible with interval common duct stone extraction and sphincterotomy. 7. Stable right pelvic kidney containing adjacent 4 mm and 3 mm calculi. 8. Stable probable combination of marked right hydronephrosis and renal cyst formation, most likely due to chronic UPJ obstruction. 9. Mild diffuse urinary bladder wall thickening with 2 tiny diverticula laterally on the right. This could be due to chronic bladder outlet obstruction by the enlarged prostate gland or chronic cystitis. 10. Sigmoid diverticulosis. Electronically Signed   By: Claudie Revering M.D.   On: 11/03/2019 16:37        Scheduled Meds: . aspirin EC  81 mg Oral Daily  . diphenhydrAMINE  12.5 mg Intravenous Once  . donepezil  5 mg Oral QHS  . enoxaparin (LOVENOX) injection  40 mg Subcutaneous Q24H  . gabapentin  300 mg Oral QHS  . influenza vaccine adjuvanted  0.5 mL Intramuscular Tomorrow-1000  . insulin aspart  0-9 Units Subcutaneous TID WC  . metoprolol tartrate  25 mg Oral BID  . pantoprazole (PROTONIX) IV  40 mg Intravenous Q12H  . risperiDONE  0.5 mg Oral QHS  . simvastatin  40 mg Oral QHS  . tamsulosin  0.4 mg Oral Daily  . traZODone  50 mg Oral QHS    Continuous Infusions: . azithromycin Stopped (11/04/19 1712)  . cefTRIAXone (ROCEPHIN)  IV 1 g (11/04/19 2347)  . dextrose 5 % with kcl    . potassium chloride       LOS: 2 days    Time spent: 25-30 min    Ezekiel Slocumb, DO [facility] Pager: [phone]  If 7PM-7AM, please contact night-coverage [email or web address] Password [email or web address] 11/05/2019, 8:00 AM

## 2019-11-06 LAB — BASIC METABOLIC PANEL
Anion gap: 9 (ref 5–15)
BUN: 24 mg/dL — ABNORMAL HIGH (ref 8–23)
CO2: 21 mmol/L — ABNORMAL LOW (ref 22–32)
Calcium: 8.1 mg/dL — ABNORMAL LOW (ref 8.9–10.3)
Chloride: 112 mmol/L — ABNORMAL HIGH (ref 98–111)
Creatinine, Ser: 1.13 mg/dL (ref 0.61–1.24)
GFR calc Af Amer: >60 mL/min (ref >60)
GFR calc non Af Amer: 59 mL/min — ABNORMAL LOW (ref >60)
Glucose, Bld: 120 mg/dL — ABNORMAL HIGH (ref 70–99)
Potassium: 4 mmol/L (ref 3.5–5.1)
Sodium: 142 mmol/L (ref 135–145)

## 2019-11-06 LAB — CBC WITH DIFFERENTIAL/PLATELET
Abs Immature Granulocytes: 0.1 10*3/uL — ABNORMAL HIGH (ref 0.00–0.07)
Basophils Absolute: 0 10*3/uL (ref 0.0–0.1)
Basophils Relative: 0 %
Eosinophils Absolute: 0.2 10*3/uL (ref 0.0–0.5)
Eosinophils Relative: 2 %
HCT: 42.2 % (ref 39.0–52.0)
Hemoglobin: 13.9 g/dL (ref 13.0–17.0)
Immature Granulocytes: 1 %
Lymphocytes Relative: 21 %
Lymphs Abs: 1.8 10*3/uL (ref 0.7–4.0)
MCH: 29.1 pg (ref 26.0–34.0)
MCHC: 32.9 g/dL (ref 30.0–36.0)
MCV: 88.5 fL (ref 80.0–100.0)
Monocytes Absolute: 0.8 10*3/uL (ref 0.1–1.0)
Monocytes Relative: 10 %
Neutro Abs: 5.5 10*3/uL (ref 1.7–7.7)
Neutrophils Relative %: 66 %
Platelets: 99 10*3/uL — ABNORMAL LOW (ref 150–400)
RBC: 4.77 MIL/uL (ref 4.22–5.81)
RDW: 13.8 % (ref 11.5–15.5)
WBC: 8.4 10*3/uL (ref 4.0–10.5)
nRBC: 0 % (ref 0.0–0.2)

## 2019-11-06 LAB — GLUCOSE, CAPILLARY
Glucose-Capillary: 94 mg/dL (ref 70–99)
Glucose-Capillary: 139 mg/dL — ABNORMAL HIGH (ref 70–99)
Glucose-Capillary: 151 mg/dL — ABNORMAL HIGH (ref 70–99)
Glucose-Capillary: 171 mg/dL — ABNORMAL HIGH (ref 70–99)

## 2019-11-06 LAB — H PYLORI, IGM, IGG, IGA AB
H Pylori IgG: 0.8 Index Value — ABNORMAL HIGH (ref 0.00–0.79)
H. Pylogi, Iga Abs: <9 units (ref 0.0–8.9)
H. Pylogi, Igm Abs: <9 units (ref 0.0–8.9)

## 2019-11-06 LAB — URINE CULTURE: Culture: 10000 — AB

## 2019-11-06 LAB — MAGNESIUM: Magnesium: 2.2 mg/dL (ref 1.7–2.4)

## 2019-11-06 MED ORDER — PANTOPRAZOLE SODIUM 40 MG PO TBEC
40.0000 mg | DELAYED_RELEASE_TABLET | Freq: Two times a day (BID) | ORAL | Status: DC
  Administered 2019-11-06 – 2019-11-09 (×5): 40 mg via ORAL
  Filled 2019-11-06 (×6): qty 1

## 2019-11-06 NOTE — Progress Notes (Signed)
Attending MD was notified of tachiepniec about 28-32, and wet lungs. O2 sats is 96 % though. At this time we will just watch the respiratory status , no new order .

## 2019-11-06 NOTE — Care Management Important Message (Signed)
Important Message  Patient Details  Name: Matthew Schultz MRN: [id number] Date of Birth: 06-26-1932   Medicare Important Message Given:  Yes     Dannette Barbara 11/06/2019, 12:25 PM

## 2019-11-06 NOTE — Progress Notes (Signed)
PROGRESS NOTE    Matthew Schultz  [id number] DOB: 09-01-1932 DOA: 11/03/2019  PCP: Maryland Pink, MD    LOS - 3   Brief Narrative:  85 y.o.malewith medical history significant ofdementia, CVA with dysphagia, CAD, hypertension, type 2 diabetes who presentedwith decreased p.o. intake, increased thirst and altered mental status since last week. Daughterreported patient had anew cough, increased fatigue, less interactive,but denies any fevers.Patient was initially hypothermic in the ED, placed on bear hugger. Otherwise vitals stable. Labs showed WBC 11.7, platelets 95k, normal lactate. UA negative. CT abdomen showed inflammatory changes at RUQ likely duodenitis/PUD. CT head without acute findings, did show mild chronic bilateral maxillary sinusitis. CXR show right lower lobe infiltrate. He is admitted for management of likely aspiration pneumonia, treating with antibiotics and having speech therapy evaluate swallow.   Subjective 11/9: Patient seen awake sitting up in bed, daughter at bedside feeding him lunch.  She reports no signs of aspiration.  NT reported fed breakfast to patient this AM and no trouble with swallowing.  Patient denies trouble breathing, chest pain, N/V/D or other acute complaints.  Assessment & Plan:   Principal Problem:   Right lower lobe pneumonia Active Problems:   Hypothermia   Duodenitis   Hypernatremia   Hyperlipidemia   Dementia with behavioral disturbance (HCC)   BPH (benign prostatic hyperplasia)   Right Lower Lobe Pneumonia -Aspiration vs Community Acquired  Hypothermia - resolved Patient with h/o dysphagia secondary to prior stroke and dementia. With new cough, lethargy, hypothermic. RLL infiltrate on CXR. - continue Rocephin and Azithromycin - follow up blood cultures - bear hugger as needed for hypothermia  Dysphagia - speech therapy following, case discussed.  Patient's level of alertness waxes and wanes, discussed with  nursing to assist with feeds, only feed when patient is awake and alert.  Pured dysphagia 1 diet with nectar thickened liquids for now.  Patient taking oral meds.  Urinary Tract Infection - urine culture done on admission growing Proteus mirabilis pan-sensitive except for Macrobid resistant.  Unclear if patient symptomatic due to dementia.   - continue Zosyn as above  Hypernatremia - resolvedwith D5W.  Likely due to poor PO intake over several days PTA. Recheck BMP in AM.  Duodenitis - resume oral PPI. Checking H pylori stool antigen.  Hyperlipidemia - continue simvastatin  Dementia - continue Aricept, Risperdal, trazadone. Hold hydroxyzine while lethargic/AMS.   GERD - resume oral PPI  BPH - continue Flomax   DVT prophylaxis:Lovenox Code Status: Full Code Family Communication: At bedside Disposition Plan:Pending clinical improvement. Expect d/c home which is family's preference.   Consultants:  Speech therapy  Procedures:  none  Antimicrobials:  Rocephin and azithromycin, start 11/6, stop 11/12   Objective: Vitals:   11/05/19 1628 11/05/19 2033 11/06/19 0443 11/06/19 1340  BP:  124/67 135/77 128/68  Pulse:  63 60 68  Resp:  20 18 16   Temp:  98.5 F (36.9 C) 98.4 F (36.9 C) 98.7 F (37.1 C)  TempSrc:  Oral Oral Oral  SpO2:  94% 98% 97%  Weight: 81.5 kg     Height:        Intake/Output Summary (Last 24 hours) at 11/06/2019 1532 Last data filed at 11/06/2019 1300 Gross per 24 hour  Intake 815.77 ml  Output 900 ml  Net -84.23 ml   Filed Weights   11/05/19 1628  Weight: 81.5 kg    Examination:  General exam: awake, alert, no acute distress Respiratory system: diminished at left base, rhonchi at right  base, no wheezes or rales, normal respiratory effort. Cardiovascular system: normal S1/S2, RRR, no JVD, murmurs, rubs, gallops, no pedal edema.  Gastrointestinal system: soft, non-tender, non-distended abdomen, normal bowel  sounds Central nervous system: No gross focal neurologic deficits, normal speech Extremities: no cyanosis, no edema, normal tone Skin: dry, intact, normal temperature,  Psychiatry: normal mood, congruent affect, abnormal insight and judgement    Data Reviewed: I have personally reviewed following labs and imaging studies  CBC: Recent Labs  Lab 11/03/19 1239 11/04/19 0351 11/05/19 0602 11/06/19 0442  WBC 11.7* 11.0* 7.5 8.4  NEUTROABS 9.6*  --  5.2 5.5  HGB 15.8 13.4 13.4 13.9  HCT 47.6 40.9 39.4 42.2  MCV 88.0 87.2 84.7 88.5  PLT 95* 100* 96* 99*   Basic Metabolic Panel: Recent Labs  Lab 11/03/19 1239 11/04/19 0351 11/05/19 0602 11/06/19 0442  NA 142 148* 142 142  K 4.6 3.0* 3.2* 4.0  CL 109 118* 112* 112*  CO2 22 22 21* 21*  GLUCOSE 301* 176* 215* 120*  BUN 45* 35* 25* 24*  CREATININE 1.18 1.14 1.02 1.13  CALCIUM 8.2* 7.8* 7.8* 8.1*  MG  --   --   --  2.2   GFR: Estimated Creatinine Clearance: 47.9 mL/min (by C-G formula based on SCr of 1.13 mg/dL). Liver Function Tests: Recent Labs  Lab 11/03/19 1239 11/04/19 0351  AST 22 13*  ALT 19 15  ALKPHOS 115 94  BILITOT 2.2* 1.3*  PROT 7.3 6.4*  ALBUMIN 3.4* 2.8*   Recent Labs  Lab 11/03/19 1239  LIPASE 53*   No results for input(s): AMMONIA in the last 168 hours. Coagulation Profile: Recent Labs  Lab 11/03/19 1337  INR 1.1   Cardiac Enzymes: No results for input(s): CKTOTAL, CKMB, CKMBINDEX, TROPONINI in the last 168 hours. BNP (last 3 results) No results for input(s): PROBNP in the last 8760 hours. HbA1C: Recent Labs    11/05/19 0639  HGBA1C 10.9*   CBG: Recent Labs  Lab 11/05/19 1209 11/05/19 1654 11/05/19 2110 11/06/19 0729 11/06/19 1134  GLUCAP 136* 128* 124* 94 139*   Lipid Profile: No results for input(s): CHOL, HDL, LDLCALC, TRIG, CHOLHDL, LDLDIRECT in the last 72 hours. Thyroid Function Tests: Recent Labs    11/03/19 1811  TSH 2.411   Anemia Panel: No results for  input(s): VITAMINB12, FOLATE, FERRITIN, TIBC, IRON, RETICCTPCT in the last 72 hours. Sepsis Labs: Recent Labs  Lab 11/03/19 1239 11/03/19 1811  LATICACIDVEN 1.9 1.0    Recent Results (from the past 240 hour(s))  SARS CORONAVIRUS 2 (TAT 6-24 HRS) Nasopharyngeal Nasopharyngeal Swab     Status: None   Collection Time: 11/03/19 12:39 PM   Specimen: Nasopharyngeal Swab  Result Value Ref Range Status   SARS Coronavirus 2 NEGATIVE NEGATIVE Final    Comment: (NOTE) SARS-CoV-2 target nucleic acids are NOT DETECTED. The SARS-CoV-2 RNA is generally detectable in upper and lower respiratory specimens during the acute phase of infection. Negative results do not preclude SARS-CoV-2 infection, do not rule out co-infections with other pathogens, and should not be used as the sole basis for treatment or other patient management decisions. Negative results must be combined with clinical observations, patient history, and epidemiological information. The expected result is Negative. Fact Sheet for Patients: SugarRoll.be Fact Sheet for Healthcare Providers: https://www.woods-mathews.com/ This test is not yet approved or cleared by the [place] FDA and  has been authorized for detection and/or diagnosis of SARS-CoV-2 by FDA under an Emergency Use Authorization (EUA). This  EUA will remain  in effect (meaning this test can be used) for the duration of the COVID-19 declaration under Section 56 4(b)(1) of the Act, 21 U.S.C. section 360bbb-3(b)(1), unless the authorization is terminated or revoked sooner. Performed at [facility], [place] [address] [place], [place] [address]   Urine culture     Status: Abnormal   Collection Time: 11/03/19 12:39 PM   Specimen: In/Out Cath Urine  Result Value Ref Range Status   Specimen Description   Final    IN/OUT CATH URINE Performed at [facility], [address] [place], [place] [address]     Special Requests   Final    NONE Performed at [facility], [address] [place], [place] [address]    Culture 10,000 COLONIES/mL PROTEUS MIRABILIS (A)  Final   Report Status 11/06/2019 FINAL  Final   Organism ID, Bacteria PROTEUS MIRABILIS (A)  Final      Susceptibility   Proteus mirabilis - MIC*    AMPICILLIN <=2 SENSITIVE Sensitive     CEFAZOLIN <=4 SENSITIVE Sensitive     CEFTRIAXONE <=1 SENSITIVE Sensitive     CIPROFLOXACIN <=0.25 SENSITIVE Sensitive     GENTAMICIN <=1 SENSITIVE Sensitive     IMIPENEM 2 SENSITIVE Sensitive     NITROFURANTOIN 128 RESISTANT Resistant     TRIMETH/SULFA <=20 SENSITIVE Sensitive     AMPICILLIN/SULBACTAM <=2 SENSITIVE Sensitive     PIP/TAZO <=4 SENSITIVE Sensitive     * 10,000 COLONIES/mL PROTEUS MIRABILIS  Blood Culture (routine x 2)     Status: None (Preliminary result)   Collection Time: 11/03/19  1:31 PM   Specimen: BLOOD  Result Value Ref Range Status   Specimen Description BLOOD RIGHT ANTECUBITAL  Final   Special Requests   Final    BOTTLES DRAWN AEROBIC AND ANAEROBIC Blood Culture adequate volume   Culture   Final    NO GROWTH 3 DAYS Performed at [facility], [address] [place], [place] [address]    Report Status PENDING  Incomplete  Blood Culture (routine x 2)     Status: None (Preliminary result)   Collection Time: 11/03/19  1:37 PM   Specimen: BLOOD  Result Value Ref Range Status   Specimen Description BLOOD RIGHT ANTECUBITAL  Final   Special Requests   Final    BOTTLES DRAWN AEROBIC AND ANAEROBIC Blood Culture adequate volume   Culture   Final    NO GROWTH 3 DAYS Performed at [facility], [address], [place],  [address]    Report Status PENDING  Incomplete         Radiology Studies: No results found.      Scheduled Meds: . aspirin EC  81 mg Oral Daily  . diphenhydrAMINE  12.5 mg Intravenous Once  . donepezil  5 mg Oral QHS  . enoxaparin (LOVENOX) injection  40 mg  Subcutaneous Q24H  . gabapentin  300 mg Oral QHS  . insulin aspart  0-9 Units Subcutaneous TID WC  . metoprolol tartrate  25 mg Oral BID  . pantoprazole  40 mg Oral BID  . risperiDONE  0.5 mg Oral QHS  . simvastatin  40 mg Oral QHS  . tamsulosin  0.4 mg Oral Daily  . traZODone  50 mg Oral QHS   Continuous Infusions: . sodium chloride 75 mL/hr at 11/06/19 0947  . cefTRIAXone (ROCEPHIN)  IV Stopped (11/05/19 2338)     LOS: 3 days    Time spent:  11 min    Ezekiel Slocumb, DO [facility] Pager: [phone]  If 7PM-7AM, please contact night-coverage [email or web address] Password [facility] 11/06/2019, 3:32 PM

## 2019-11-07 ENCOUNTER — Inpatient Hospital Stay: Payer: Medicare HMO

## 2019-11-07 DIAGNOSIS — Z0000 Encounter for general adult medical examination without abnormal findings: Secondary | ICD-10-CM | POA: Diagnosis not present

## 2019-11-07 LAB — GLUCOSE, CAPILLARY
Glucose-Capillary: 126 mg/dL — ABNORMAL HIGH (ref 70–99)
Glucose-Capillary: 234 mg/dL — ABNORMAL HIGH (ref 70–99)
Glucose-Capillary: 206 mg/dL — ABNORMAL HIGH (ref 70–99)
Glucose-Capillary: 192 mg/dL — ABNORMAL HIGH (ref 70–99)

## 2019-11-07 MED ORDER — SODIUM CHLORIDE 0.9 % IV SOLN
500.0000 mg | INTRAVENOUS | Status: AC
  Administered 2019-11-07 – 2019-11-09 (×3): 500 mg via INTRAVENOUS
  Filled 2019-11-07 (×3): qty 500

## 2019-11-07 NOTE — Evaluation (Signed)
Physical Therapy Evaluation Patient Details Name: Matthew Schultz MRN: [id number] DOB: 1932/03/09 Today's Date: 11/07/2019   History of Present Illness  85 y.o. male with medical history significant of Dementia, CVA with dysphagia, CAD, hypertension, type 2 diabetes who presents with concerns of decreased p.o. intake, increased thirst and altered mental status since last week.  Daughter reports new cough, pt found to have R lower lobe pneumonia.  Clinical Impression  Pt struggled with all aspects of PT exam and frankly was unable to fully engage with cuing, etc.  He was repeatedly talking about nails, holes, pigs during the session but could answer no questions when asked and only very minimally followed only with congruent verbal and tactile cues to try mobility.  He struggled to maintain static sitting and though he indicated he would assist with trying to get to standing he ultimately could not even lift his bottom off the bed despite max assist, heavy cuing and raising the bed.     Follow Up Recommendations SNF;Supervision/Assistance - 24 hour    Equipment Recommendations  None recommended by PT    Recommendations for Other Services       Precautions / Restrictions Precautions Precautions: Fall Restrictions Weight Bearing Restrictions: No      Mobility  Bed Mobility Overal bed mobility: Needs Assistance Bed Mobility: Supine to Sit;Sit to Supine     Supine to sit: Max assist Sit to supine: Max assist   General bed mobility comments: Pt essentially unable to assist, showed no true effort.  Leaning back and to the L t/o the effort, eventually able to maintain balance with only CGA/min assist but unstable t/o the effort.  Transfers Overall transfer level: Needs assistance Equipment used: Rolling walker (2 wheeled) Transfers: Sit to/from Stand Sit to Stand: Total assist         General transfer comment: Multiple attempts at standing with bed raised, pt unable to  initiate elevation and even with extremely heavy assist unable to even get close to standing  Ambulation/Gait                Stairs            Wheelchair Mobility    Modified Rankin (Stroke Patients Only)       Balance Overall balance assessment: Needs assistance Sitting-balance support: Bilateral upper extremity supported Sitting balance-Leahy Scale: Poor Sitting balance - Comments: Pt struggled to maintain static upright sitting, needing at least CGA and most of the time min/mod just to stay upright     Standing balance-Leahy Scale: Zero Standing balance comment: unable to attain standing                             Pertinent Vitals/Pain Pain Assessment: (unable to rate, does not appear to be in pain t/o session)    Home Living Family/patient expects to be discharged to:: Private residence Living Arrangements: Spouse/significant other;Children   Type of Home: House Home Access: Stairs to enter Entrance Stairs-Rails: Can reach both Entrance Stairs-Number of Steps: 4 Home Layout: One level   Additional Comments: pt unable to answer, information gathered from previous documentations    Prior Function           Comments: Pt unable to answer, apparently he was able to ambulate in the home even recently?     Hand Dominance        Extremity/Trunk Assessment   Upper Extremity Assessment Upper Extremity Assessment: Difficult to  assess due to impaired cognition(limited AROM even with multiple methods of cuing)    Lower Extremity Assessment Lower Extremity Assessment: Difficult to assess due to impaired cognition(unable to follow instructions to formally test)       Communication   Communication: (random and inconsistent off topic verbalizations)  Cognition   Behavior During Therapy: Impulsive;Restless Overall Cognitive Status: Difficult to assess                                 General Comments: Pt with baseline  dementia, profoundly confused this date, unsure how this relates to baseline      General Comments      Exercises     Assessment/Plan    PT Assessment Patient needs continued PT services  PT Problem List Decreased strength;Decreased range of motion;Decreased balance;Decreased activity tolerance;Decreased mobility;Decreased coordination;Decreased knowledge of use of DME;Decreased safety awareness;Decreased cognition       PT Treatment Interventions DME instruction;Gait training;Functional mobility training;Therapeutic activities;Therapeutic exercise;Cognitive remediation;Neuromuscular re-education;Balance training;Patient/family education    PT Goals (Current goals can be found in the Care Plan section)  Acute Rehab PT Goals Patient Stated Goal: pt unable to state PT Goal Formulation: Patient unable to participate in goal setting Time For Goal Achievement: 11/21/19 Potential to Achieve Goals: Fair    Frequency Min 2X/week   Barriers to discharge        Co-evaluation               AM-PAC PT "6 Clicks" Mobility  Outcome Measure Help needed turning from your back to your side while in a flat bed without using bedrails?: Total Help needed moving from lying on your back to sitting on the side of a flat bed without using bedrails?: Total Help needed moving to and from a bed to a chair (including a wheelchair)?: Total Help needed standing up from a chair using your arms (e.g., wheelchair or bedside chair)?: Total Help needed to walk in hospital room?: Total Help needed climbing 3-5 steps with a railing? : Total 6 Click Score: 6    End of Session Equipment Utilized During Treatment: Gait belt Activity Tolerance: (limited due to mental status) Patient left: with bed alarm set;with call bell/phone within reach Nurse Communication: Mobility status PT Visit Diagnosis: Muscle weakness (generalized) (M62.81);Difficulty in walking, not elsewhere classified (R26.2)    Time:  1531-1550 PT Time Calculation (min) (ACUTE ONLY): 19 min   Charges:   PT Evaluation $PT Eval Low Complexity: 1 Low          Kreg Shropshire, DPT 11/07/2019, 4:28 PM

## 2019-11-07 NOTE — Evaluation (Signed)
Objective Swallowing Evaluation: Type of Study: MBS-Modified Barium Swallow Study   Patient Details  Name: Matthew Schultz MRN: [id number] Date of Birth: 05-04-1932  Today's Date: 11/07/2019 Time: SLP Start Time (ACUTE ONLY): [id number] -SLP Stop Time (ACUTE ONLY): 1445  SLP Time Calculation (min) (ACUTE ONLY): 60 min   Past Medical History:  Past Medical History:  Diagnosis Date  . Arthritis   . BPH (benign prostatic hyperplasia)   . CAD (coronary artery disease)   . DM (diabetes mellitus) ([place])   . HLD (hyperlipidemia)   . HTN (hypertension)   . Hydronephrosis   . Incomplete bladder emptying   . OA (osteoarthritis)   . Renal mass   . Stroke [facility])    Past Surgical History:  Past Surgical History:  Procedure Laterality Date  . BACK SURGERY    . ERCP N/A 09/27/2018   Procedure: ENDOSCOPIC RETROGRADE CHOLANGIOPANCREATOGRAPHY (ERCP);  Surgeon: Clarene Essex, MD;  Location: [place] [place];  Service: Endoscopy;  Laterality: N/A;  . REMOVAL OF STONES  09/27/2018   Procedure: REMOVAL OF STONES;  Surgeon: Clarene Essex, MD;  Location: [place];  Service: Endoscopy;;  . SPHINCTEROTOMY  09/27/2018   Procedure: Joan Mayans;  Surgeon: Clarene Essex, MD;  Location: [facility];  Service: Endoscopy;;   HPI: Pt is a 85 y.o. male with medical history significant of Dementia, CVA with dysphagia, CAD, hypertension, type 2 diabetes who presents with concerns of decreased p.o. intake, increased thirst and altered mental status since last week.  Daughter reports that patient presented to his primary physician yesterday and had lab work drawn.  Lab work resulted today with hyperglycemia and they were asked to present to the ED for further evaluation. He was initially hypothermic with temperature down to 93.6 with improvement of temperature up to 96 after being placed on Bair hugger. He is on room air.  Dtr stated she has noticed more coughing recently w/ increased fatigue and has been communicating less than  usual w/ the family. Dtr stated she was aware of the MBSS last Oct. 2019 but that pt did not have any f/u after the study nor did he change in his liquids at home -- he has been drinking Thin liquids in his diet only.  CXR: opacities of the lungs with low lung volume; Head CT: Moderate to marked diffuse cerebral and cerebellar atrophy with progression.   Subjective: Patient alert, very distracted by internal & external stimuli    Assessment / Plan / Recommendation  CHL IP CLINICAL IMPRESSIONS 11/07/2019  Clinical Impression The patient is presenting with moderate oral dysphagia with mild pharyngeal dysphagia.  Oral dysphagia is characterized by missing dentition, disorganized / slowed posterior transfer, impaired mastication, and poor attention to task.  Pharyngeal dysphagia is characterized by delayed pharyngeal swallow initiation, minimally reduced pharyngeal pressure generation, and trace vallecular residue post swallow.  There was no observed laryngeal penetration or aspiration with all consistencies and intake methods tested- thin liquid via self-administered cup rims sips and straw sips.   The patient is observed to be more engaged in the act of eating/swallowing when allowed to feed himself (with assistance).  Recommend dysphagia 2 / minced diet with thin liquids.  SLP Visit Diagnosis --  Attention and concentration deficit following --  Frontal lobe and executive function deficit following --  Impact on safety and function Mild aspiration risk      CHL IP TREATMENT RECOMMENDATION 11/04/2019  Treatment Recommendations Therapy as outlined in treatment plan below     Prognosis 11/07/2019  Prognosis for Safe Diet Advancement Fair  Barriers to Reach Goals Cognitive deficits;Time post onset;Severity of deficits  Barriers/Prognosis Comment --    CHL IP DIET RECOMMENDATION 11/07/2019  SLP Diet Recommendations Dysphagia 2 (Fine chop) solids;Thin liquid  Liquid Administration via Cup;Straw   Medication Administration Whole meds with puree  Compensations Minimize environmental distractions;Slow rate;Small sips/bites  Postural Changes Remain semi-upright after after feeds/meals (Comment);Seated upright at 90 degrees      CHL IP OTHER RECOMMENDATIONS 11/07/2019  Recommended Consults --  Oral Care Recommendations Oral care BID;Staff/trained caregiver to provide oral care  Other Recommendations --      CHL IP FOLLOW UP RECOMMENDATIONS 11/07/2019  Follow up Recommendations Other (comment)      CHL IP FREQUENCY AND DURATION 11/04/2019  Speech Therapy Frequency (ACUTE ONLY) min 3x week  Treatment Duration 2 weeks           CHL IP ORAL PHASE 11/07/2019  Oral Phase Impaired  Oral - Pudding Teaspoon --  Oral - Pudding Cup --  Oral - Honey Teaspoon --  Oral - Honey Cup --  Oral - Nectar Teaspoon --  Oral - Nectar Cup --  Oral - Nectar Straw --  Oral - Thin Teaspoon --  Oral - Thin Cup --  Oral - Thin Straw --  Oral - Puree --  Oral - Mech Soft --  Oral - Regular --  Oral - Multi-Consistency --  Oral - Pill --  Oral Phase - Comment Poor attention, impaired mastication, oral residue, disorganized    CHL IP PHARYNGEAL PHASE 11/07/2019  Pharyngeal Phase Impaired  Pharyngeal- Pudding Teaspoon --  Pharyngeal --  Pharyngeal- Pudding Cup --  Pharyngeal --  Pharyngeal- Honey Teaspoon --  Pharyngeal --  Pharyngeal- Honey Cup --  Pharyngeal --  Pharyngeal- Nectar Teaspoon --  Pharyngeal --  Pharyngeal- Nectar Cup --  Pharyngeal --  Pharyngeal- Nectar Straw --  Pharyngeal --  Pharyngeal- Thin Teaspoon --  Pharyngeal --  Pharyngeal- Thin Cup --  Pharyngeal --  Pharyngeal- Thin Straw --  Pharyngeal --  Pharyngeal- Puree --  Pharyngeal --  Pharyngeal- Mechanical Soft --  Pharyngeal --  Pharyngeal- Regular --  Pharyngeal --  Pharyngeal- Multi-consistency --  Pharyngeal --  Pharyngeal- Pill --  Pharyngeal --  Pharyngeal Comment delayed initiation, trace  vallecular residue     CHL IP CERVICAL ESOPHAGEAL PHASE 09/29/2018  Cervical Esophageal Phase WFL  Pudding Teaspoon --  Pudding Cup --  Honey Teaspoon --  Honey Cup --  Nectar Teaspoon --  Nectar Cup --  Nectar Straw --  Thin Teaspoon --  Thin Cup --  Thin Straw --  Puree --  Mechanical Soft --  Regular --  Multi-consistency --  Pill --  Cervical Esophageal Comment --    Leroy Sea, MS/CCC- SLP  Valetta Fuller, Susie 11/07/2019, 3:02 PM

## 2019-11-07 NOTE — Plan of Care (Signed)
Patient confused, impulsive and attempting to get out of bed. Floor mats in place and mittens on. Low bed ordered. Will continue to monitor patient.

## 2019-11-07 NOTE — Progress Notes (Addendum)
PROGRESS NOTE    Matthew Schultz  [id number] DOB: 1932-12-03 DOA: 11/03/2019  PCP: Maryland Pink, MD    LOS - 4   Brief Narrative:  85 y.o.malewith medical history significant ofdementia, CVA with dysphagia, CAD, hypertension, type 2 diabetes who presentedwith decreased p.o. intake, increased thirst and altered mental status since last week. Daughterreported patient had anew cough, increased fatigue, less interactive,but denies any fevers.Patient was initially hypothermic in the ED, placed on bear hugger. Otherwise vitals stable. Labs showed WBC 11.7, platelets 95k, normal lactate. UA negative. CT abdomen showed inflammatory changes at RUQ likely duodenitis/PUD. CT head without acute findings, did show mild chronic bilateral maxillary sinusitis. CXR show right lower lobe infiltrate. He is admitted for management of likely aspiration pneumonia, treating with antibiotics and having speech therapy evaluate swallow.  Subjective 11/10: Patient awake, sitting up in bed watching TV.  No acute events reported overnight.  Reports he ate breakfast.  Denies fever or chills or trouble breathing.  Answers "all right I reckon" to almost every question.    Assessment & Plan:   Principal Problem:   Right lower lobe pneumonia Active Problems:   Hypothermia   Duodenitis   Hypernatremia   Hyperlipidemia   Dementia with behavioral disturbance (HCC)   BPH (benign prostatic hyperplasia)   Right Lower Lobe Pneumonia -Aspiration vs Community Acquired  Hypothermia - resolved Patient with h/o dysphagia secondary to prior stroke and dementia. With new cough, lethargy, hypothermic. RLL infiltrate on CXR. - continue Rocephin and Azithromycin - follow up blood cultures - bear hugger as needed for hypothermia  Dysphagia- speech therapy following, case discussed. Patient's level of alertness waxes and wanes, discussed with nursing to assist with feeds, only feed when patient is awake  and alert. MBSS done on 11/10.  SLP recommends dysphagia 2 diet and thin liquids.  Urinary Tract Infection - urine culture done on admission growing Proteus mirabilis pan-sensitive except for Macrobid resistant.  Unclear if patient symptomatic due to dementia.   - continue Zosyn as above  Hypernatremia - resolvedwith D5W.  Likely due to poor PO intake over several days PTA. Recheck BMP in AM.  Duodenitis - resume oral PPI. Checking H pylori stool antigen.  Hyperlipidemia - continue simvastatin  Dementia - continue Aricept, Risperdal, trazadone. Hold hydroxyzine while lethargic/AMS.   GERD - resume oral PPI  BPH - continue Flomax   DVT prophylaxis:Lovenox Code Status: Full Code Family Communication:At bedside Disposition Plan:pending PT evaluation, family wishes patient to return home, do not want SNF.  Medically, expect stable for d/c 11/11.  Consultants:  Speech therapy  Procedures:  none  Antimicrobials:  Rocephin and azithromycin, start 11/6, stop 11/12   Objective: Vitals:   11/07/19 0510 11/07/19 1141 11/07/19 1141 11/07/19 1203  BP: (!) 143/81 113/84 113/84 121/74  Pulse: 65 74 74 75  Resp: 20 18 18    Temp: (!) 97.5 F (36.4 C) 98.1 F (36.7 C) 98.1 F (36.7 C)   TempSrc: Oral Axillary Oral   SpO2: 100% 96% 96% 92%  Weight:      Height:        Intake/Output Summary (Last 24 hours) at 11/07/2019 1408 Last data filed at 11/07/2019 0500 Gross per 24 hour  Intake 1319.72 ml  Output 1650 ml  Net -330.28 ml   Filed Weights   11/05/19 1628  Weight: 81.5 kg    Examination:  General exam: awake, alert, no acute distress Respiratory system: diminished at right base, otherwise clear, no wheezes, rales or rhonchi,  normal respiratory effort. Cardiovascular system: normal S1/S2, RRR, no JVD, murmurs, rubs, gallops, no pedal edema.   Gastrointestinal system: soft, non-tender, non-distended abdomen, no organomegaly or masses  felt, normal bowel sounds. Central nervous system: no gross focal neurologic deficits, alert x oriented x1 Extremities: moves all, no edema, no cyanosis  Skin: dry, intact, normal temperature Psychiatry: normal mood, congruent affect  Data Reviewed: I have personally reviewed following labs and imaging studies  CBC: Recent Labs  Lab 11/03/19 1239 11/04/19 0351 11/05/19 0602 11/06/19 0442  WBC 11.7* 11.0* 7.5 8.4  NEUTROABS 9.6*  --  5.2 5.5  HGB 15.8 13.4 13.4 13.9  HCT 47.6 40.9 39.4 42.2  MCV 88.0 87.2 84.7 88.5  PLT 95* 100* 96* 99*   Basic Metabolic Panel: Recent Labs  Lab 11/03/19 1239 11/04/19 0351 11/05/19 0602 11/06/19 0442  NA 142 148* 142 142  K 4.6 3.0* 3.2* 4.0  CL 109 118* 112* 112*  CO2 22 22 21* 21*  GLUCOSE 301* 176* 215* 120*  BUN 45* 35* 25* 24*  CREATININE 1.18 1.14 1.02 1.13  CALCIUM 8.2* 7.8* 7.8* 8.1*  MG  --   --   --  2.2   GFR: Estimated Creatinine Clearance: 47.9 mL/min (by C-G formula based on SCr of 1.13 mg/dL). Liver Function Tests: Recent Labs  Lab 11/03/19 1239 11/04/19 0351  AST 22 13*  ALT 19 15  ALKPHOS 115 94  BILITOT 2.2* 1.3*  PROT 7.3 6.4*  ALBUMIN 3.4* 2.8*   Recent Labs  Lab 11/03/19 1239  LIPASE 53*   No results for input(s): AMMONIA in the last 168 hours. Coagulation Profile: Recent Labs  Lab 11/03/19 1337  INR 1.1   Cardiac Enzymes: No results for input(s): CKTOTAL, CKMB, CKMBINDEX, TROPONINI in the last 168 hours. BNP (last 3 results) No results for input(s): PROBNP in the last 8760 hours. HbA1C: Recent Labs    11/05/19 0639  HGBA1C 10.9*   CBG: Recent Labs  Lab 11/06/19 1134 11/06/19 1627 11/06/19 2127 11/07/19 0754 11/07/19 1136  GLUCAP 139* 151* 171* 126* 234*   Lipid Profile: No results for input(s): CHOL, HDL, LDLCALC, TRIG, CHOLHDL, LDLDIRECT in the last 72 hours. Thyroid Function Tests: No results for input(s): TSH, T4TOTAL, FREET4, T3FREE, THYROIDAB in the last 72 hours. Anemia  Panel: No results for input(s): VITAMINB12, FOLATE, FERRITIN, TIBC, IRON, RETICCTPCT in the last 72 hours. Sepsis Labs: Recent Labs  Lab 11/03/19 1239 11/03/19 1811  LATICACIDVEN 1.9 1.0    Recent Results (from the past 240 hour(s))  SARS CORONAVIRUS 2 (TAT 6-24 HRS) Nasopharyngeal Nasopharyngeal Swab     Status: None   Collection Time: 11/03/19 12:39 PM   Specimen: Nasopharyngeal Swab  Result Value Ref Range Status   SARS Coronavirus 2 NEGATIVE NEGATIVE Final    Comment: (NOTE) SARS-CoV-2 target nucleic acids are NOT DETECTED. The SARS-CoV-2 RNA is generally detectable in upper and lower respiratory specimens during the acute phase of infection. Negative results do not preclude SARS-CoV-2 infection, do not rule out co-infections with other pathogens, and should not be used as the sole basis for treatment or other patient management decisions. Negative results must be combined with clinical observations, patient history, and epidemiological information. The expected result is Negative. Fact Sheet for Patients: SugarRoll.be Fact Sheet for Healthcare Providers: https://www.woods-mathews.com/ This test is not yet approved or cleared by the [place] FDA and  has been authorized for detection and/or diagnosis of SARS-CoV-2 by FDA under an Emergency Use Authorization (EUA). This EUA  will remain  in effect (meaning this test can be used) for the duration of the COVID-19 declaration under Section 56 4(b)(1) of the Act, 21 U.S.C. section 360bbb-3(b)(1), unless the authorization is terminated or revoked sooner. Performed at [facility], [place] [address] [place], [place] [address]   Urine culture     Status: Abnormal   Collection Time: 11/03/19 12:39 PM   Specimen: In/Out Cath Urine  Result Value Ref Range Status   Specimen Description   Final    IN/OUT CATH URINE Performed at [facility], [address]  [place], [place] [address]    Special Requests   Final    NONE Performed at [facility], [address] [place], [place] [address]    Culture 10,000 COLONIES/mL PROTEUS MIRABILIS (A)  Final   Report Status 11/06/2019 FINAL  Final   Organism ID, Bacteria PROTEUS MIRABILIS (A)  Final      Susceptibility   Proteus mirabilis - MIC*    AMPICILLIN <=2 SENSITIVE Sensitive     CEFAZOLIN <=4 SENSITIVE Sensitive     CEFTRIAXONE <=1 SENSITIVE Sensitive     CIPROFLOXACIN <=0.25 SENSITIVE Sensitive     GENTAMICIN <=1 SENSITIVE Sensitive     IMIPENEM 2 SENSITIVE Sensitive     NITROFURANTOIN 128 RESISTANT Resistant     TRIMETH/SULFA <=20 SENSITIVE Sensitive     AMPICILLIN/SULBACTAM <=2 SENSITIVE Sensitive     PIP/TAZO <=4 SENSITIVE Sensitive     * 10,000 COLONIES/mL PROTEUS MIRABILIS  Blood Culture (routine x 2)     Status: None (Preliminary result)   Collection Time: 11/03/19  1:31 PM   Specimen: BLOOD  Result Value Ref Range Status   Specimen Description BLOOD RIGHT ANTECUBITAL  Final   Special Requests   Final    BOTTLES DRAWN AEROBIC AND ANAEROBIC Blood Culture adequate volume   Culture   Final    NO GROWTH 4 DAYS Performed at [facility], [address] [place], [place] [address]    Report Status PENDING  Incomplete  Blood Culture (routine x 2)     Status: None (Preliminary result)   Collection Time: 11/03/19  1:37 PM   Specimen: BLOOD  Result Value Ref Range Status   Specimen Description BLOOD RIGHT ANTECUBITAL  Final   Special Requests   Final    BOTTLES DRAWN AEROBIC AND ANAEROBIC Blood Culture adequate volume   Culture   Final    NO GROWTH 4 DAYS Performed at [facility], [address] [place], [place] [address]    Report Status PENDING  Incomplete         Radiology Studies: No results found.      Scheduled Meds: . aspirin EC  81 mg Oral Daily  . diphenhydrAMINE  12.5 mg Intravenous Once  . donepezil  5 mg Oral QHS  . enoxaparin  (LOVENOX) injection  40 mg Subcutaneous Q24H  . gabapentin  300 mg Oral QHS  . insulin aspart  0-9 Units Subcutaneous TID WC  . metoprolol tartrate  25 mg Oral BID  . pantoprazole  40 mg Oral BID  . risperiDONE  0.5 mg Oral QHS  . simvastatin  40 mg Oral QHS  . tamsulosin  0.4 mg Oral Daily  . traZODone  50 mg Oral QHS   Continuous Infusions: . azithromycin 500 mg (11/07/19 1135)  . cefTRIAXone (ROCEPHIN)  IV 1 g (11/06/19 2158)     LOS: 4 days    Time spent: 25-30 min  Ezekiel Slocumb, DO [facility] Pager: [phone]  If 7PM-7AM, please contact night-coverage [email or web address] Password [email or web address] 11/07/2019, 2:08 PM

## 2019-11-08 LAB — BLOOD GAS, VENOUS
Acid-Base Excess: 3.7 mmol/L — ABNORMAL HIGH (ref 0.0–2.0)
Bicarbonate: 29.7 mmol/L — ABNORMAL HIGH (ref 20.0–28.0)
O2 Saturation: 47.1 %
Patient temperature: 37
pCO2, Ven: 49 mmHg (ref 44.0–60.0)
pH, Ven: 7.39 (ref 7.250–7.430)

## 2019-11-08 LAB — CULTURE, BLOOD (ROUTINE X 2)
Culture: NO GROWTH
Culture: NO GROWTH
Special Requests: ADEQUATE
Special Requests: ADEQUATE

## 2019-11-08 LAB — GLUCOSE, CAPILLARY
Glucose-Capillary: 149 mg/dL — ABNORMAL HIGH (ref 70–99)
Glucose-Capillary: 156 mg/dL — ABNORMAL HIGH (ref 70–99)
Glucose-Capillary: 123 mg/dL — ABNORMAL HIGH (ref 70–99)
Glucose-Capillary: 120 mg/dL — ABNORMAL HIGH (ref 70–99)

## 2019-11-08 MED ORDER — SODIUM CHLORIDE 0.9 % IV SOLN
INTRAVENOUS | Status: DC | PRN
  Administered 2019-11-08: 250 mL via INTRAVENOUS
  Administered 2019-11-08 – 2019-11-09 (×2): 30 mL via INTRAVENOUS

## 2019-11-08 NOTE — TOC Initial Note (Signed)
Transition of Care [facility]) - Initial/Assessment Note    Patient Details  Name: Matthew Schultz MRN: [id number] Date of Birth: January 25, 1932  Transition of Care [facility]) CM/SW Contact:    Beverly Sessions, RN Phone Number: 11/08/2019, 3:21 PM  Clinical Narrative:                 Patient admitted for PNA Patient only alert to self.   Assessment completed via phone with daughter [name].   He lives at daughter's home [place], [place] [address] 2 daughters, son, and niece provided 64 hour care  Primary care [place].  Daughter provides transport to appointments  Patient has WC, RW, hospital bed, and shower seat.   PT has assessed patient and recommends SNF.  Discussed with wife, and daughter [name]. Daughter declines SNF placement, and does not wish for RNCM to initiate bed search.   Daughter wishes for home with home health services and a hoyer lift.  Daughter states she does not have a preference of home health agency.  Referral made to [facility] with [facility].  MD notified   Expected Discharge Plan: [place] Barriers to Discharge: Continued Medical Work up   Patient Goals and CMS Choice     Choice offered to / list presented to : Patient  Expected Discharge Plan and Services Expected Discharge Plan: [place]   Discharge Planning Services: CM Consult Post Acute Care Choice: [place] arrangements for the past 2 months: Single Family Home                           HH Arranged: RN, PT, Nurse's Aide, Social Work, Refused SNF [place] Agency: [facility] [employer] Date [place]: 11/08/19 Time [place]: 1520 Representative spoke with at [place] Arrangements/Services Living arrangements for the past 2 months: [place] with:: Adult Children Patient language and need for interpreter reviewed:: Yes Do you feel safe going back to the  place where you live?: Yes      Need for Family Participation in Patient Care: Yes (Comment) Care giver support system in place?: Yes (comment) Current home services: DME Criminal Activity/Legal Involvement Pertinent to Current Situation/Hospitalization: No - Comment as needed  Activities of Daily [place] Devices/Equipment: Hospital bed, Wheelchair, Environmental consultant (specify type), Bedside commode/3-in-1, Other (Comment)(bathchair) ADL Screening (condition at time of admission) Patient's cognitive ability adequate to safely complete daily activities?: No Is the patient deaf or have difficulty hearing?: No Does the patient have difficulty seeing, even when wearing glasses/contacts?: No Does the patient have difficulty concentrating, remembering, or making decisions?: Yes Patient able to express need for assistance with ADLs?: No Does the patient have difficulty dressing or bathing?: Yes Independently performs ADLs?: No Communication: Dependent Is this a change from baseline?: Pre-admission baseline Dressing (OT): Dependent Is this a change from baseline?: Pre-admission baseline Grooming: Dependent Is this a change from baseline?: Pre-admission baseline Feeding: Dependent Is this a change from baseline?: Pre-admission baseline Bathing: Dependent Is this a change from baseline?: Pre-admission baseline Toileting: Dependent Is this a change from baseline?: Pre-admission baseline In/Out Bed: Dependent Is this a change from baseline?: Pre-admission baseline Walks in Home: Dependent Is this a change from baseline?: Change from baseline, expected to last >3 days Does the patient have difficulty walking or climbing stairs?: Yes Weakness of Legs: Both Weakness of  Arms/Hands: Both  Permission Sought/Granted                  Emotional Assessment       Orientation: : Oriented to Self   Psych Involvement: No (comment)  Admission diagnosis:  Weakness [R53.1] Duodenitis  [K29.80] Hypothermia, initial encounter [T68.XXXA] Sepsis, due to unspecified organism, unspecified whether acute organ dysfunction present ([place]) [A41.9] Hypothermia [T68.XXXA] Patient Active Problem List   Diagnosis Date Noted  . Duodenitis 11/04/2019  . BPH (benign prostatic hyperplasia) 11/04/2019  . Right lower lobe pneumonia 11/04/2019  . Hypernatremia 11/04/2019  . Hypothermia 11/03/2019  . Enterococcal bacteremia   . Malnutrition of moderate degree 09/27/2018  . Cholangiolitis 09/26/2018  . Acute cholangitis 09/26/2018  . Sepsis due to Escherichia coli ([place])   . E coli bacteremia   . Hyperbilirubinemia   . Protein-calorie malnutrition, severe 04/14/2018  . Dementia with behavioral disturbance ([place]) 04/14/2018  . Acute cholecystitis 04/12/2018  . Choledocholithiasis   . Syncope 10/19/2016  . AKI (acute kidney injury) ([place]) 10/19/2016  . Cataract 09/07/2016  . Coronary artery disease 09/07/2016  . Diabetes ([place]) 09/07/2016  . HTN (hypertension) 09/07/2016  . Hyperlipidemia 09/07/2016  . Osteoarthritis 09/07/2016  . Stroke ([place]) 09/07/2016  . DDD (degenerative disc disease), lumbar 08/23/2014  . Lumbar radiculitis 08/23/2014  . Low back pain 06/01/2014  . Lumbar radiculopathy 06/01/2014   PCP:  Mary[place] Pink, MD Pharmacy:   [facility], [address] [place] [address] Phone: [phone] Fax: [phone]     Social Determinants of Health (SDOH) Interventions    Readmission Risk Interventions No flowsheet data found.

## 2019-11-08 NOTE — Progress Notes (Addendum)
Inpatient Diabetes Program Recommendations  AACE/ADA: New Consensus Statement on Inpatient Glycemic Control (2015)  Target Ranges:  Prepandial:   less than 140 mg/dL      Peak postprandial:   less than 180 mg/dL (1-2 hours)      Critically ill patients:  140 - 180 mg/dL   Lab Results  Component Value Date   GLUCAP 192 (H) 11/07/2019   HGBA1C 10.9 (H) 11/05/2019    Review of Glycemic Control  Diabetes history: DM 2 Outpatient Diabetes medications: None Current orders for Inpatient glycemic control:  Novolog 0-9 units tid  Inpatient Diabetes Program Recommendations:    Noted A1c 10.9%. Patient may benefit from Tradjenta 5 mg Daily (consider ordering here).   Could do benefits check to see which medication in it's class is cheaper for d/c.  Will follow trends on current regimen.  Addendum 11:45: Spoke with daughter at bedside. Patient in soft wrist restraints minimally interactive. Patient lives with children at home. Discussed A1c level and glucose and A1c goals. Briefly discussed dietary modifications for DM. Daughter reports pt loves to eat little debbie cakes 1-2 times a day.  Discussed the need for some type of medication therapy for DM management at time of d/c. Also suggested for them to check glucose 1-2 times a day to monitor trends.  Thanks,  Tama Headings RN, MSN, BC-ADM Inpatient Diabetes Coordinator Team Pager [phone] (8a-5p)

## 2019-11-08 NOTE — Progress Notes (Signed)
PROGRESS NOTE    Matthew Schultz  [id number] DOB: 1932-06-22 DOA: 11/03/2019 PCP: Maryland Pink, MD   Brief Narrative:  85 y.o.malewith medical history significant ofdementia, CVA with dysphagia, CAD, hypertension, type 2 diabetes who presentedwith decreased p.o. intake, increased thirst and altered mental status since last week. Daughterreported patient had anew cough, increased fatigue, less interactive,but denies any fevers.Patient was initially hypothermic in the ED, placed on bear hugger. Otherwise vitals stable. Labs showed WBC 11.7, platelets 95k, normal lactate. UA negative. CT abdomen showed inflammatory changes at RUQ likely duodenitis/PUD. CT head without acute findings, did show mild chronic bilateral maxillary sinusitis. CXR show right lower lobe infiltrate. He is admitted for management of likely aspiration pneumonia, treating with antibiotics and having speech therapy evaluate swallow   Assessment & Plan:   Principal Problem:   Right lower lobe pneumonia Active Problems:   Hyperlipidemia   Dementia with behavioral disturbance (HCC)   Hypothermia   Duodenitis   BPH (benign prostatic hyperplasia)   Hypernatremia   Right Lower Lobe Pneumonia -Aspiration vs Community Acquired  Hypothermia - resolved Patient with h/o dysphagia secondary to prior stroke and dementia. With new cough, lethargy, hypothermic. RLL infiltrate on CXR. - continue Rocephin 11/6 and Azithromycin 11/10 -blood cultures NTD  Dysphagia- speech therapy following, case discussed. Patient's level of alertness waxes and wanes, discussed with nursing to assist with feeds, only feed when patient is awake and alert. MBSS done on 11/10.  SLP recommends dysphagia 2 diet and thin liquids.  Urinary Tract Infection - urine culture done on admission growing Proteus mirabilis pan-sensitive except for Macrobid resistant. Unclear if patient symptomatic due to dementia.    Hypernatremia -  resolved, monitor.  Duodenitis -resume oral PPI. Checking H pylori stool antigen.  Hyperlipidemia - continue simvastatin  Dementia - continue Aricept, Risperdal, trazadone. Hold hydroxyzine while lethargic/AMS.   GERD -resume oral PPI  BPH - continue Flomax  DVT prophylaxis: Lovenox SQ  Code Status: Full    Code Status Orders  (From admission, onward)         Start     Ordered   11/03/19 1918  Full code  Continuous     11/03/19 1918        Code Status History    Date Active Date Inactive Code Status Order ID Comments User Context   09/26/2018 0346 10/06/2018 0008 Full Code [id number]  Kandice Hams, MD Inpatient   09/26/2018 0341 09/26/2018 0346 Full Code [id number]  Kandice Hams, MD Inpatient   09/26/2018 0340 09/26/2018 0341 Full Code [id number]  Omar Person, NP Inpatient   04/12/2018 0456 04/15/2018 2035 Full Code [id number]  Amelia Jo, MD ED   10/19/2016 2221 10/20/2016 1635 Full Code [id number]  Lance Coon, MD Inpatient   Advance Care Planning Activity     Family Communication: none today  Disposition Plan:   Patient will remain inpatient for continued IV antibiotics Consults called: None Admission status: Inpatient   Consultants:   None  Procedures:  Dg Chest 1 View  Result Date: 11/03/2019 CLINICAL DATA:  85 year old male with a history of weakness EXAM: CHEST  1 VIEW COMPARISON:  09/25/2018 FINDINGS: Cardiomediastinal silhouette likely unchanged in size and contour with left rotation of the patient. Reticulonodular opacities of the lungs new from the prior. No pneumothorax. No large pleural effusion. Low lung volumes persist. No displaced fracture IMPRESSION: Reticulonodular opacities of the lungs with low lung volumes may reflect atelectasis and chronic changes, however, atypical infection not excluded. Electronically  Signed   By: Corrie Mckusick D.O.   On: 11/03/2019 14:06   Ct Head Wo Contrast  Result Date: 11/03/2019 CLINICAL  DATA:  Increased weakness over the past several days with poor p.o. intake for the past 12 hours. EXAM: CT HEAD WITHOUT CONTRAST TECHNIQUE: Contiguous axial images were obtained from the base of the skull through the vertex without intravenous contrast. COMPARISON:  09/25/2018. FINDINGS: Brain: Moderate to marked enlargement of the ventricles and subarachnoid spaces with mild progression. Stable moderate patchy white matter low density in both cerebral hemispheres. No intracranial hemorrhage, mass lesion or CT evidence of acute infarction. Vascular: No hyperdense vessel or unexpected calcification. Skull: Normal. Negative for fracture or focal lesion. Sinuses/Orbits: Status post bilateral cataract extraction. Mild bilateral maxillary sinus mucosal thickening. Other: None. IMPRESSION: 1. No acute abnormality. 2. Moderate to marked diffuse cerebral and cerebellar atrophy with mild progression. 3. Stable moderate chronic small vessel white matter ischemic changes in both cerebral hemispheres. 4. Mild chronic bilateral maxillary sinusitis. Electronically Signed   By: Claudie Revering M.D.   On: 11/03/2019 16:18   Ct Abdomen Pelvis W Contrast  Result Date: 11/03/2019 CLINICAL DATA:  Increased weakness over the past few days with poor p.o. intake for the past 12 hours. EXAM: CT ABDOMEN AND PELVIS WITH CONTRAST TECHNIQUE: Multidetector CT imaging of the abdomen and pelvis was performed using the standard protocol following bolus administration of intravenous contrast. CONTRAST:  118mL OMNIPAQUE IOHEXOL 300 MG/ML  SOLN COMPARISON:  09/25/2018. FINDINGS: Lower chest: Interval small right pleural effusion and right lower lobe airspace consolidation. Interval mild left lower lobe airspace consolidation without pleural fluid. Stable mildly enlarged heart. Atheromatous coronary artery calcifications. Hepatobiliary: Mild diffuse gallbladder wall thickening and enhancement with mild progression since the previous examination, with  decreased distention of the gallbladder. Small amount of pericholecystic edema or fluid. No visible gallstones. Interval small amount of biliary air with resolution of the previously demonstrated intrahepatic and extrahepatic ductal dilatation. Interval ERCP and stone removal. Pancreas: Interval mild soft tissue stranding in the pancreatic head and adjacent tissues. More pronounced soft tissue stranding more superiorly, at the level of the 2nd portion of the duodenum. Spleen: Normal in size without focal abnormality. Adrenals/Urinary Tract: The previously demonstrated 2.0 cm low density right adrenal mass measures 2.0 cm on image number 29 series 2. Normal appearing left adrenal gland. Again noted is a right pelvic kidney containing adjacent 4 mm and 3 mm calculi. Also again demonstrated are parenchymal and possible parapelvic renal cysts on the right, difficult to distinguish from collecting system dilatation. Normally positioned left kidney with multiple smaller cysts. Mild diffuse bladder wall thickening with 2 tiny diverticula laterally on the right. No bladder, left kidney or left ureteral calculi. Stomach/Bowel: Multiple sigmoid colon diverticula. Normal appearing appendix. Moderate diffuse wall thickening and enhancement involving the 2nd portion of the duodenum with extensive period duodenal soft tissue stranding. There is also mild-to-moderate dilatation of the proximal 3rd portion of the duodenum, filled with fluid. No obstructing mass visualized. Mild diffuse antral wall thickening. Vascular/Lymphatic: Atheromatous arterial calcifications without aneurysm. No enlarged lymph nodes. Reproductive: Minimally enlarged prostate gland. Other: Small bilateral inguinal hernias containing fat and very small umbilical hernia containing fat. Musculoskeletal: Mild to moderate levoconvex lumbar scoliosis. Stable lumbar spine degenerative changes and old L3 superior endplate compression deformity. IMPRESSION: 1.  Interval extensive inflammatory changes in the right upper quadrant of the abdomen, centered at the level of the proximal 2nd portion of the duodenum. This is most  compatible with marked duodenitis/ulcer disease. No evidence of perforation. 2. Mild inflammatory changes involving the head of the pancreas, most likely secondary to the inflammation involving the duodenum. Acute pancreatitis is less likely. 3. Mild inflammatory changes involving the gallbladder, also most likely secondary to the inflammation involving the duodenum. Mild changes of acute cholecystitis are less likely. 4. Interval small right pleural effusion and right lower lobe airspace atelectasis or pneumonia. 5. Interval mild left lower lobe airspace atelectasis or pneumonia. 6. Interval small amount of biliary air with resolution of the previously demonstrated intrahepatic and extrahepatic ductal dilatation, compatible with interval common duct stone extraction and sphincterotomy. 7. Stable right pelvic kidney containing adjacent 4 mm and 3 mm calculi. 8. Stable probable combination of marked right hydronephrosis and renal cyst formation, most likely due to chronic UPJ obstruction. 9. Mild diffuse urinary bladder wall thickening with 2 tiny diverticula laterally on the right. This could be due to chronic bladder outlet obstruction by the enlarged prostate gland or chronic cystitis. 10. Sigmoid diverticulosis. Electronically Signed   By: Claudie Revering M.D.   On: 11/03/2019 16:37     Antimicrobials:   Rocephin and azithromycin, start 11/6, stop 11/12    Subjective: No acute events, confused, has mittens on  Objective: Vitals:   11/07/19 2010 11/08/19 0453 11/08/19 0833 11/08/19 1144  BP: 124/74 111/61 (!) 132/59 117/66  Pulse: 75 (!) 57 (!) 56 63  Resp: 20 20 16  (!) 25  Temp: (!) 97.4 F (36.3 C) 98.3 F (36.8 C) 97.6 F (36.4 C) 98.3 F (36.8 C)  TempSrc: Axillary Oral Oral Axillary  SpO2: 99%  96% 95%  Weight:      Height:         Intake/Output Summary (Last 24 hours) at 11/08/2019 1506 Last data filed at 11/08/2019 1052 Gross per 24 hour  Intake 350 ml  Output 1530 ml  Net -1180 ml   Filed Weights   11/05/19 1628  Weight: 81.5 kg    Examination: General exam: awake, alert, but confused Respiratory system: still diminished at right base, otherwise clear, no wheezes, rales or rhonchi, normal respiratory effort. Cardiovascular system: normal S1/S2, RRR, no JVD, murmurs, rubs, gallops, no pedal edema.   Gastrointestinal system: soft, non-tender, non-distended abdomen, no organomegaly or masses felt, normal bowel sounds. Central nervous system: no gross focal neurologic deficits, alert x oriented x1 Extremities: moves all, no edema, no cyanosis  mittens in place      Skin: dry, intact, normal temperature Psychiatry: confused, impaired cognitiont     Data Reviewed: I have personally reviewed following labs and imaging studies  CBC: Recent Labs  Lab 11/03/19 1239 11/04/19 0351 11/05/19 0602 11/06/19 0442  WBC 11.7* 11.0* 7.5 8.4  NEUTROABS 9.6*  --  5.2 5.5  HGB 15.8 13.4 13.4 13.9  HCT 47.6 40.9 39.4 42.2  MCV 88.0 87.2 84.7 88.5  PLT 95* 100* 96* 99*   Basic Metabolic Panel: Recent Labs  Lab 11/03/19 1239 11/04/19 0351 11/05/19 0602 11/06/19 0442  NA 142 148* 142 142  K 4.6 3.0* 3.2* 4.0  CL 109 118* 112* 112*  CO2 22 22 21* 21*  GLUCOSE 301* 176* 215* 120*  BUN 45* 35* 25* 24*  CREATININE 1.18 1.14 1.02 1.13  CALCIUM 8.2* 7.8* 7.8* 8.1*  MG  --   --   --  2.2   GFR: Estimated Creatinine Clearance: 47.9 mL/min (by C-G formula based on SCr of 1.13 mg/dL). Liver Function Tests: Recent Labs  Lab  11/03/19 1239 11/04/19 0351  AST 22 13*  ALT 19 15  ALKPHOS 115 94  BILITOT 2.2* 1.3*  PROT 7.3 6.4*  ALBUMIN 3.4* 2.8*   Recent Labs  Lab 11/03/19 1239  LIPASE 53*   No results for input(s): AMMONIA in the last 168 hours. Coagulation Profile: Recent Labs  Lab  11/03/19 1337  INR 1.1   Cardiac Enzymes: No results for input(s): CKTOTAL, CKMB, CKMBINDEX, TROPONINI in the last 168 hours. BNP (last 3 results) No results for input(s): PROBNP in the last 8760 hours. HbA1C: No results for input(s): HGBA1C in the last 72 hours. CBG: Recent Labs  Lab 11/07/19 1136 11/07/19 1703 11/07/19 2239 11/08/19 0754 11/08/19 1202  GLUCAP 234* 206* 192* 149* 156*   Lipid Profile: No results for input(s): CHOL, HDL, LDLCALC, TRIG, CHOLHDL, LDLDIRECT in the last 72 hours. Thyroid Function Tests: No results for input(s): TSH, T4TOTAL, FREET4, T3FREE, THYROIDAB in the last 72 hours. Anemia Panel: No results for input(s): VITAMINB12, FOLATE, FERRITIN, TIBC, IRON, RETICCTPCT in the last 72 hours. Sepsis Labs: Recent Labs  Lab 11/03/19 1239 11/03/19 1811  LATICACIDVEN 1.9 1.0    Recent Results (from the past 240 hour(s))  SARS CORONAVIRUS 2 (TAT 6-24 HRS) Nasopharyngeal Nasopharyngeal Swab     Status: None   Collection Time: 11/03/19 12:39 PM   Specimen: Nasopharyngeal Swab  Result Value Ref Range Status   SARS Coronavirus 2 NEGATIVE NEGATIVE Final    Comment: (NOTE) SARS-CoV-2 target nucleic acids are NOT DETECTED. The SARS-CoV-2 RNA is generally detectable in upper and lower respiratory specimens during the acute phase of infection. Negative results do not preclude SARS-CoV-2 infection, do not rule out co-infections with other pathogens, and should not be used as the sole basis for treatment or other patient management decisions. Negative results must be combined with clinical observations, patient history, and epidemiological information. The expected result is Negative. Fact Sheet for Patients: SugarRoll.be Fact Sheet for Healthcare Providers: https://www.woods-mathews.com/ This test is not yet approved or cleared by the [place] FDA and  has been authorized for detection and/or diagnosis of  SARS-CoV-2 by FDA under an Emergency Use Authorization (EUA). This EUA will remain  in effect (meaning this test can be used) for the duration of the COVID-19 declaration under Section 56 4(b)(1) of the Act, 21 U.S.C. section 360bbb-3(b)(1), unless the authorization is terminated or revoked sooner. Performed at [facility], [place] [address], [place], [place] [address]   Urine culture     Status: Abnormal   Collection Time: 11/03/19 12:39 PM   Specimen: In/Out Cath Urine  Result Value Ref Range Status   Specimen Description   Final    IN/OUT CATH URINE Performed at [facility], [address] [place], [place] [address]    Special Requests   Final    NONE Performed at [facility], [address] [place], [place] [address]    Culture 10,000 COLONIES/mL PROTEUS MIRABILIS (A)  Final   Report Status 11/06/2019 FINAL  Final   Organism ID, Bacteria PROTEUS MIRABILIS (A)  Final      Susceptibility   Proteus mirabilis - MIC*    AMPICILLIN <=2 SENSITIVE Sensitive     CEFAZOLIN <=4 SENSITIVE Sensitive     CEFTRIAXONE <=1 SENSITIVE Sensitive     CIPROFLOXACIN <=0.25 SENSITIVE Sensitive     GENTAMICIN <=1 SENSITIVE Sensitive     IMIPENEM 2 SENSITIVE Sensitive     NITROFURANTOIN 128 RESISTANT Resistant     TRIMETH/SULFA <=  20 SENSITIVE Sensitive     AMPICILLIN/SULBACTAM <=2 SENSITIVE Sensitive     PIP/TAZO <=4 SENSITIVE Sensitive     * 10,000 COLONIES/mL PROTEUS MIRABILIS  Blood Culture (routine x 2)     Status: None   Collection Time: 11/03/19  1:31 PM   Specimen: BLOOD  Result Value Ref Range Status   Specimen Description BLOOD RIGHT ANTECUBITAL  Final   Special Requests   Final    BOTTLES DRAWN AEROBIC AND ANAEROBIC Blood Culture adequate volume   Culture   Final    NO GROWTH 5 DAYS Performed at [facility], [address], [place], [place] [address]    Report Status 11/08/2019 FINAL  Final  Blood Culture (routine x 2)     Status: None    Collection Time: 11/03/19  1:37 PM   Specimen: BLOOD  Result Value Ref Range Status   Specimen Description BLOOD RIGHT ANTECUBITAL  Final   Special Requests   Final    BOTTLES DRAWN AEROBIC AND ANAEROBIC Blood Culture adequate volume   Culture   Final    NO GROWTH 5 DAYS Performed at [facility], [address], [place],  [address]    Report Status 11/08/2019 FINAL  Final         Radiology Studies: No results found.      Scheduled Meds:  aspirin EC  81 mg Oral Daily   diphenhydrAMINE  12.5 mg Intravenous Once   donepezil  5 mg Oral QHS   enoxaparin (LOVENOX) injection  40 mg Subcutaneous Q24H   gabapentin  300 mg Oral QHS   insulin aspart  0-9 Units Subcutaneous TID WC   metoprolol tartrate  25 mg Oral BID   pantoprazole  40 mg Oral BID   risperiDONE  0.5 mg Oral QHS   simvastatin  40 mg Oral QHS   tamsulosin  0.4 mg Oral Daily   traZODone  50 mg Oral QHS   Continuous Infusions:  sodium chloride 30 mL (11/08/19 0917)   azithromycin 500 mg (11/08/19 0918)   cefTRIAXone (ROCEPHIN)  IV 1 g (11/07/19 2237)     LOS: 5 days    Time spent: 42 min    Nicolette Bang, MD [facility]  If 7PM-7AM, please contact night-coverage  11/08/2019, 3:06 PM

## 2019-11-09 LAB — GLUCOSE, CAPILLARY
Glucose-Capillary: 115 mg/dL — ABNORMAL HIGH (ref 70–99)
Glucose-Capillary: 120 mg/dL — ABNORMAL HIGH (ref 70–99)
Glucose-Capillary: 117 mg/dL — ABNORMAL HIGH (ref 70–99)

## 2019-11-09 MED ORDER — LEVOFLOXACIN 500 MG PO TABS: 500.0000 mg | ORAL_TABLET | Freq: Every day | ORAL | 0 refills | Status: AC

## 2019-11-09 NOTE — Discharge Summary (Signed)
Physician Discharge Summary  Matthew Schultz [id number] DOB: [birth year] DOA: 11/03/2019  PCP: [place] Pink, MD  Admit date: 11/03/2019 Discharge date: 11/09/2019  Admitted From: Inpatient Disposition: home  Recommendations for Outpatient Follow-up:  1. Follow up with PCP in 1-2 weeks 2. Please obtain BMP/CBC in one week 3. Please follow up on the following pending results:  Home Health:Yes Equipment/Devices:hoyer lift Discharge Condition:Fair CODE STATUS:Full code Diet recommendation: Regular healthy diet  Brief/Interim Summary: 85 y.o.malewith medical history significant ofdementia, CVA with dysphagia, CAD, hypertension, type 2 diabetes who presentedwith decreased p.o. intake, increased thirst and altered mental status since last week. Daughterreported patient had anew cough, increased fatigue, less interactive,but denies any fevers.Patient was initially hypothermic in the ED, placed on bear hugger. Otherwise vitals stable. Labs showed WBC 11.7, platelets 95k, normal lactate. UA negative. CT abdomen showed inflammatory changes at RUQ likely duodenitis/PUD. CT head without acute findings, did show mild chronic bilateral maxillary sinusitis. CXR show right lower lobe infiltrate. He is admitted for management of likely aspiration pneumonia, treating with antibiotics and having speech therapy evaluate swallow  Hospital course:  Right Lower Lobe Pneumonia -Aspiration vs Community Acquired  Hypothermia - resolved Patient with h/o dysphagia secondary to prior stroke and dementia. With new cough, lethargy, hypothermic. RLL infiltrate on CXR. - continued Rocephin 11/6 and Azithromycin 11/10 and will complete a course of abx on d/c -blood cultures NTD  Dysphagia- speech therapy following, case discussed. Patient's level of alertness waxes and wanes, discussed with nursing to assist with feeds, only feed when patient is awake and alert. MBSS done on 11/10. SLP  recommends dysphagia 2 diet and thin liquids.  Urinary Tract Infection - urine culture done on admission growing Proteus mirabilis pan-sensitive except for Macrobid resistant. Unclear if patient symptomatic due to dementia. Will complete abx as above  Hypernatremia - resolved, monitor.  Duodenitis -resume oral PPI.   Hyperlipidemia - continue simvastatin  Dementia - continue Aricept, Risperdal, trazadone. Hold hydroxyzine while lethargic/AMS.   GERD -resume oral PPI on d/c  BPH - continue Flomax on d/c  Patient high risk for readmission but family continues to decline skilled nursing facility  Discharge Diagnoses:  Principal Problem:   Right lower lobe pneumonia Active Problems:   Hyperlipidemia   Dementia with behavioral disturbance (HCC)   Hypothermia   Duodenitis   BPH (benign prostatic hyperplasia)   Hypernatremia    Discharge Instructions  Discharge Instructions    Call MD for:   Complete by: As directed    For any acute change in medical condition   Call MD for:  difficulty breathing, headache or visual disturbances   Complete by: As directed    Call MD for:  hives   Complete by: As directed    Call MD for:  persistant dizziness or light-headedness   Complete by: As directed    Call MD for:  persistant nausea and vomiting   Complete by: As directed    Call MD for:  severe uncontrolled pain   Complete by: As directed    Call MD for:  temperature >100.4   Complete by: As directed    Diet - low sodium heart healthy   Complete by: As directed    Increase activity slowly   Complete by: As directed      Allergies as of 11/09/2019   No Known Allergies     Medication List    TAKE these medications   aspirin EC 81 MG tablet Take 81 mg by mouth daily.  donepezil 5 MG tablet Commonly known as: ARICEPT Take 5 mg by mouth at bedtime.   feeding supplement (ENSURE ENLIVE) Liqd Take 237 mLs by mouth 3 (three) times daily between meals.    gabapentin 300 MG capsule Commonly known as: NEURONTIN Take 300 mg by mouth at bedtime.   hydrOXYzine 10 MG tablet Commonly known as: ATARAX/VISTARIL Take 20 mg by mouth at bedtime.   insulin aspart 100 UNIT/ML injection Commonly known as: novoLOG 0-9 Units, Subcutaneous, 3 times daily with meals CBG < 70: implement hypoglycemia protocol CBG 70 - 120: 0 units CBG 121 - 150: 1 unit CBG 151 - 200: 2 units CBG 201 - 250: 3 units CBG 251 - 300: 5 units CBG 301 - 350: 7 units CBG 351 - 400: 9 units CBG > 400: call MD   levofloxacin 500 MG tablet Commonly known as: LEVAQUIN Take 1 tablet (500 mg total) by mouth daily for 5 days.   metoprolol tartrate 25 MG tablet Commonly known as: LOPRESSOR Take 25 mg by mouth 2 (two) times daily.   omeprazole 20 MG capsule Commonly known as: PRILOSEC Take 20 mg by mouth 2 (two) times daily.   risperiDONE 0.5 MG tablet Commonly known as: RISPERDAL Take 1 tablet (0.5 mg total) by mouth at bedtime.   simvastatin 40 MG tablet Commonly known as: ZOCOR Take 40 mg by mouth at bedtime.   tamsulosin 0.4 MG Caps capsule Commonly known as: FLOMAX Take 1 capsule (0.4 mg total) by mouth daily.   traMADol 50 MG tablet Commonly known as: ULTRAM Take 1 tablet (50 mg total) by mouth every 8 (eight) hours as needed.   traZODone 50 MG tablet Commonly known as: DESYREL Take 50 mg by mouth at bedtime.            Durable Medical Equipment  (From admission, onward)         Start     Ordered   11/09/19 1155  For home use only DME Other see comment  Once    Comments: Harrel Lemon lift  Question:  Length of Need  Answer:  Lifetime   11/09/19 1154          No Known Allergies  Consultations:  None   Procedures/Studies: Dg Chest 1 View  Result Date: 11/03/2019 CLINICAL DATA:  85 year old male with a history of weakness EXAM: CHEST  1 VIEW COMPARISON:  09/25/2018 FINDINGS: Cardiomediastinal silhouette likely unchanged in size and contour  with left rotation of the patient. Reticulonodular opacities of the lungs new from the prior. No pneumothorax. No large pleural effusion. Low lung volumes persist. No displaced fracture IMPRESSION: Reticulonodular opacities of the lungs with low lung volumes may reflect atelectasis and chronic changes, however, atypical infection not excluded. Electronically Signed   By: Corrie Mckusick D.O.   On: 11/03/2019 14:06   Ct Head Wo Contrast  Result Date: 11/03/2019 CLINICAL DATA:  Increased weakness over the past several days with poor p.o. intake for the past 12 hours. EXAM: CT HEAD WITHOUT CONTRAST TECHNIQUE: Contiguous axial images were obtained from the base of the skull through the vertex without intravenous contrast. COMPARISON:  09/25/2018. FINDINGS: Brain: Moderate to marked enlargement of the ventricles and subarachnoid spaces with mild progression. Stable moderate patchy white matter low density in both cerebral hemispheres. No intracranial hemorrhage, mass lesion or CT evidence of acute infarction. Vascular: No hyperdense vessel or unexpected calcification. Skull: Normal. Negative for fracture or focal lesion. Sinuses/Orbits: Status post bilateral cataract extraction. Mild bilateral maxillary  sinus mucosal thickening. Other: None. IMPRESSION: 1. No acute abnormality. 2. Moderate to marked diffuse cerebral and cerebellar atrophy with mild progression. 3. Stable moderate chronic small vessel white matter ischemic changes in both cerebral hemispheres. 4. Mild chronic bilateral maxillary sinusitis. Electronically Signed   By: Claudie Revering M.D.   On: 11/03/2019 16:18   Ct Abdomen Pelvis W Contrast  Result Date: 11/03/2019 CLINICAL DATA:  Increased weakness over the past few days with poor p.o. intake for the past 12 hours. EXAM: CT ABDOMEN AND PELVIS WITH CONTRAST TECHNIQUE: Multidetector CT imaging of the abdomen and pelvis was performed using the standard protocol following bolus administration of  intravenous contrast. CONTRAST:  157mL OMNIPAQUE IOHEXOL 300 MG/ML  SOLN COMPARISON:  09/25/2018. FINDINGS: Lower chest: Interval small right pleural effusion and right lower lobe airspace consolidation. Interval mild left lower lobe airspace consolidation without pleural fluid. Stable mildly enlarged heart. Atheromatous coronary artery calcifications. Hepatobiliary: Mild diffuse gallbladder wall thickening and enhancement with mild progression since the previous examination, with decreased distention of the gallbladder. Small amount of pericholecystic edema or fluid. No visible gallstones. Interval small amount of biliary air with resolution of the previously demonstrated intrahepatic and extrahepatic ductal dilatation. Interval ERCP and stone removal. Pancreas: Interval mild soft tissue stranding in the pancreatic head and adjacent tissues. More pronounced soft tissue stranding more superiorly, at the level of the 2nd portion of the duodenum. Spleen: Normal in size without focal abnormality. Adrenals/Urinary Tract: The previously demonstrated 2.0 cm low density right adrenal mass measures 2.0 cm on image number 29 series 2. Normal appearing left adrenal gland. Again noted is a right pelvic kidney containing adjacent 4 mm and 3 mm calculi. Also again demonstrated are parenchymal and possible parapelvic renal cysts on the right, difficult to distinguish from collecting system dilatation. Normally positioned left kidney with multiple smaller cysts. Mild diffuse bladder wall thickening with 2 tiny diverticula laterally on the right. No bladder, left kidney or left ureteral calculi. Stomach/Bowel: Multiple sigmoid colon diverticula. Normal appearing appendix. Moderate diffuse wall thickening and enhancement involving the 2nd portion of the duodenum with extensive period duodenal soft tissue stranding. There is also mild-to-moderate dilatation of the proximal 3rd portion of the duodenum, filled with fluid. No  obstructing mass visualized. Mild diffuse antral wall thickening. Vascular/Lymphatic: Atheromatous arterial calcifications without aneurysm. No enlarged lymph nodes. Reproductive: Minimally enlarged prostate gland. Other: Small bilateral inguinal hernias containing fat and very small umbilical hernia containing fat. Musculoskeletal: Mild to moderate levoconvex lumbar scoliosis. Stable lumbar spine degenerative changes and old L3 superior endplate compression deformity. IMPRESSION: 1. Interval extensive inflammatory changes in the right upper quadrant of the abdomen, centered at the level of the proximal 2nd portion of the duodenum. This is most compatible with marked duodenitis/ulcer disease. No evidence of perforation. 2. Mild inflammatory changes involving the head of the pancreas, most likely secondary to the inflammation involving the duodenum. Acute pancreatitis is less likely. 3. Mild inflammatory changes involving the gallbladder, also most likely secondary to the inflammation involving the duodenum. Mild changes of acute cholecystitis are less likely. 4. Interval small right pleural effusion and right lower lobe airspace atelectasis or pneumonia. 5. Interval mild left lower lobe airspace atelectasis or pneumonia. 6. Interval small amount of biliary air with resolution of the previously demonstrated intrahepatic and extrahepatic ductal dilatation, compatible with interval common duct stone extraction and sphincterotomy. 7. Stable right pelvic kidney containing adjacent 4 mm and 3 mm calculi. 8. 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 9. Mild diffuse urinary bladder wall thickening with 2 tiny diverticula laterally on the right. This could be due to chronic bladder outlet obstruction by the enlarged prostate gland or chronic cystitis. 10. Sigmoid diverticulosis. Electronically Signed   By: Claudie Revering M.D.   On:  11/03/2019 16:37       Subjective: Patient is at baseline As noted PT recommends skilled nursing facility but patient's family has declined   Discharge Exam: Vitals:   11/09/19 0855 11/09/19 1154  BP: (!) 142/70 128/70  Pulse: 61 64  Resp: (!) 25 (!) 24  Temp: 98.2 F (36.8 C) 99.3 F (37.4 C)  SpO2: 96% 96%   Vitals:   11/09/19 0414 11/09/19 0611 11/09/19 0855 11/09/19 1154  BP: 131/63 123/76 (!) 142/70 128/70  Pulse: (!) 57 60 61 64  Resp: 16 16 (!) 25 (!) 24  Temp: 97.6 F (36.4 C) (!) 97.5 F (36.4 C) 98.2 F (36.8 C) 99.3 F (37.4 C)  TempSrc: Oral Axillary Oral Oral  SpO2: 97% 96% 96% 96%  Weight:      Height:        General: Pt is alert, awake, not in acute distress Cardiovascular: RRR, S1/S2 +, no rubs, no gallops Respiratory: CTA bilaterally, no wheezing, no rhonchi Abdominal: Soft, NT, ND, bowel sounds + Extremities: no edema, no cyanosis    The results of significant diagnostics from this hospitalization (including imaging, microbiology, ancillary and laboratory) are listed below for reference.     Microbiology: Recent Results (from the past 240 hour(s))  SARS CORONAVIRUS 2 (TAT 6-24 HRS) Nasopharyngeal Nasopharyngeal Swab     Status: None   Collection Time: 11/03/19 12:39 PM   Specimen: Nasopharyngeal Swab  Result Value Ref Range Status   SARS Coronavirus 2 NEGATIVE NEGATIVE Final    Comment: (NOTE) SARS-CoV-2 target nucleic acids are NOT DETECTED. The SARS-CoV-2 RNA is generally detectable in upper and lower respiratory specimens during the acute phase of infection. Negative results do not preclude SARS-CoV-2 infection, do not rule out co-infections with other pathogens, and should not be used as the sole basis for treatment or other patient management decisions. Negative results must be combined with clinical observations, patient history, and epidemiological information. The expected result is Negative. Fact Sheet for  Patients: SugarRoll.be Fact Sheet for Healthcare Providers: https://www.woods-mathews.com/ This test is not yet approved or cleared by the [place] FDA and  has been authorized for detection and/or diagnosis of SARS-CoV-2 by FDA under an Emergency Use Authorization (EUA). This EUA will remain  in effect (meaning this test can be used) for the duration of the COVID-19 declaration under Section 56 4(b)(1) of the Act, 21 U.S.C. section 360bbb-3(b)(1), unless the authorization is terminated or revoked sooner. Performed at [facility], [place] [address], [place], [place] [address]   Urine culture     Status: Abnormal   Collection Time: 11/03/19 12:39 PM   Specimen: In/Out Cath Urine  Result Value Ref Range Status   Specimen Description   Final    IN/OUT CATH URINE Performed at [facility], [address] [place], [place] [address]    Special Requests   Final    NONE Performed at [facility], [place], [place] [address]    Culture 10,000 COLONIES/mL PROTEUS MIRABILIS (A)  Final   Report Status 11/06/2019 FINAL  Final   Organism ID, Bacteria PROTEUS MIRABILIS (A)  Final  Susceptibility   Proteus mirabilis - MIC*    AMPICILLIN <=2 SENSITIVE Sensitive     CEFAZOLIN <=4 SENSITIVE Sensitive     CEFTRIAXONE <=1 SENSITIVE Sensitive     CIPROFLOXACIN <=0.25 SENSITIVE Sensitive     GENTAMICIN <=1 SENSITIVE Sensitive     IMIPENEM 2 SENSITIVE Sensitive     NITROFURANTOIN 128 RESISTANT Resistant     TRIMETH/SULFA <=20 SENSITIVE Sensitive     AMPICILLIN/SULBACTAM <=2 SENSITIVE Sensitive     PIP/TAZO <=4 SENSITIVE Sensitive     * 10,000 COLONIES/mL PROTEUS MIRABILIS  Blood Culture (routine x 2)     Status: None   Collection Time: 11/03/19  1:31 PM   Specimen: BLOOD  Result Value Ref Range Status   Specimen Description BLOOD RIGHT ANTECUBITAL  Final   Special Requests   Final    BOTTLES DRAWN  AEROBIC AND ANAEROBIC Blood Culture adequate volume   Culture   Final    NO GROWTH 5 DAYS Performed at [facility], [place] [place], [place] [address]    Report Status 11/08/2019 FINAL  Final  Blood Culture (routine x 2)     Status: None   Collection Time: 11/03/19  1:37 PM   Specimen: BLOOD  Result Value Ref Range Status   Specimen Description BLOOD RIGHT ANTECUBITAL  Final   Special Requests   Final    BOTTLES DRAWN AEROBIC AND ANAEROBIC Blood Culture adequate volume   Culture   Final    NO GROWTH 5 DAYS Performed at [facility], [address] [place], [place] [address]    Report Status 11/08/2019 FINAL  Final     Labs: BNP (last 3 results) Recent Labs    11/03/19 1239  BNP [id number]*   Basic Metabolic Panel: Recent Labs  Lab 11/03/19 1239 11/04/19 0351 11/05/19 0602 11/06/19 0442  NA 142 148* 142 142  K 4.6 3.0* 3.2* 4.0  CL 109 118* 112* 112*  CO2 22 22 21* 21*  GLUCOSE 301* 176* 215* 120*  BUN 45* 35* 25* 24*  CREATININE 1.18 1.14 1.02 1.13  CALCIUM 8.2* 7.8* 7.8* 8.1*  MG  --   --   --  2.2   Liver Function Tests: Recent Labs  Lab 11/03/19 1239 11/04/19 0351  AST 22 13*  ALT 19 15  ALKPHOS 115 94  BILITOT 2.2* 1.3*  PROT 7.3 6.4*  ALBUMIN 3.4* 2.8*   Recent Labs  Lab 11/03/19 1239  LIPASE 53*   No results for input(s): AMMONIA in the last 168 hours. CBC: Recent Labs  Lab 11/03/19 1239 11/04/19 0351 11/05/19 0602 11/06/19 0442  WBC 11.7* 11.0* 7.5 8.4  NEUTROABS 9.6*  --  5.2 5.5  HGB 15.8 13.4 13.4 13.9  HCT 47.6 40.9 39.4 42.2  MCV 88.0 87.2 84.7 88.5  PLT 95* 100* 96* 99*   Cardiac Enzymes: No results for input(s): CKTOTAL, CKMB, CKMBINDEX, TROPONINI in the last 168 hours. BNP: Invalid input(s): POCBNP CBG: Recent Labs  Lab 11/08/19 0754 11/08/19 1202 11/08/19 1638 11/08/19 2020 11/09/19 0759  GLUCAP 149* 156* 123* 120* 115*   D-Dimer No results for input(s): DDIMER in the last 72  hours. Hgb A1c No results for input(s): HGBA1C in the last 72 hours. Lipid Profile No results for input(s): CHOL, HDL, LDLCALC, TRIG, CHOLHDL, LDLDIRECT in the last 72 hours. Thyroid function studies No results for input(s): TSH, T4TOTAL, T3FREE, THYROIDAB in the last 72 hours.  Invalid input(s): FREET3 Anemia work up No results for input(s):  VITAMINB12, FOLATE, FERRITIN, TIBC, IRON, RETICCTPCT in the last 72 hours. Urinalysis    Component Value Date/Time   COLORURINE YELLOW (A) 11/03/2019 1239   APPEARANCEUR CLEAR (A) 11/03/2019 1239   APPEARANCEUR Clear 11/28/2013 1712   LABSPEC 1.028 11/03/2019 1239   LABSPEC 1.019 11/28/2013 1712   PHURINE 6.0 11/03/2019 1239   GLUCOSEU >=500 (A) 11/03/2019 1239   GLUCOSEU Negative 11/28/2013 1712   HGBUR MODERATE (A) 11/03/2019 1239   BILIRUBINUR NEGATIVE 11/03/2019 1239   BILIRUBINUR Negative 11/28/2013 1712   KETONESUR 5 (A) 11/03/2019 1239   PROTEINUR 30 (A) 11/03/2019 1239   NITRITE NEGATIVE 11/03/2019 1239   LEUKOCYTESUR NEGATIVE 11/03/2019 1239   LEUKOCYTESUR Negative 11/28/2013 1712   Sepsis Labs Invalid input(s): PROCALCITONIN,  WBC,  LACTICIDVEN Microbiology Recent Results (from the past 240 hour(s))  SARS CORONAVIRUS 2 (TAT 6-24 HRS) Nasopharyngeal Nasopharyngeal Swab     Status: None   Collection Time: 11/03/19 12:39 PM   Specimen: Nasopharyngeal Swab  Result Value Ref Range Status   SARS Coronavirus 2 NEGATIVE NEGATIVE Final    Comment: (NOTE) SARS-CoV-2 target nucleic acids are NOT DETECTED. The SARS-CoV-2 RNA is generally detectable in upper and lower respiratory specimens during the acute phase of infection. Negative results do not preclude SARS-CoV-2 infection, do not rule out co-infections with other pathogens, and should not be used as the sole basis for treatment or other patient management decisions. Negative results must be combined with clinical observations, patient history, and epidemiological information.  The expected result is Negative. Fact Sheet for Patients: SugarRoll.be Fact Sheet for Healthcare Providers: https://www.woods-mathews.com/ This test is not yet approved or cleared by the [place] FDA and  has been authorized for detection and/or diagnosis of SARS-CoV-2 by FDA under an Emergency Use Authorization (EUA). This EUA will remain  in effect (meaning this test can be used) for the duration of the COVID-19 declaration under Section 56 4(b)(1) of the Act, 21 U.S.C. section 360bbb-3(b)(1), unless the authorization is terminated or revoked sooner. Performed at [facility], [place] [address] [place], [place] [address]   Urine culture     Status: Abnormal   Collection Time: 11/03/19 12:39 PM   Specimen: In/Out Cath Urine  Result Value Ref Range Status   Specimen Description   Final    IN/OUT CATH URINE Performed at [facility], [address] [place], [place] [address]    Special Requests   Final    NONE Performed at [facility], [place], [place] [address]    Culture 10,000 COLONIES/mL PROTEUS MIRABILIS (A)  Final   Report Status 11/06/2019 FINAL  Final   Organism ID, Bacteria PROTEUS MIRABILIS (A)  Final      Susceptibility   Proteus mirabilis - MIC*    AMPICILLIN <=2 SENSITIVE Sensitive     CEFAZOLIN <=4 SENSITIVE Sensitive     CEFTRIAXONE <=1 SENSITIVE Sensitive     CIPROFLOXACIN <=0.25 SENSITIVE Sensitive     GENTAMICIN <=1 SENSITIVE Sensitive     IMIPENEM 2 SENSITIVE Sensitive     NITROFURANTOIN 128 RESISTANT Resistant     TRIMETH/SULFA <=20 SENSITIVE Sensitive     AMPICILLIN/SULBACTAM <=2 SENSITIVE Sensitive     PIP/TAZO <=4 SENSITIVE Sensitive     * 10,000 COLONIES/mL PROTEUS MIRABILIS  Blood Culture (routine x 2)     Status: None   Collection Time: 11/03/19  1:31 PM   Specimen: BLOOD  Result Value Ref Range Status   Specimen Description BLOOD RIGHT ANTECUBITAL  Final    Special Requests   Final    BOTTLES DRAWN AEROBIC AND ANAEROBIC Blood Culture adequate volume   Culture   Final    NO GROWTH 5 DAYS Performed at [facility], [address] [place], [place] [address]    Report Status 11/08/2019 FINAL  Final  Blood Culture (routine x 2)     Status: None   Collection Time: 11/03/19  1:37 PM   Specimen: BLOOD  Result Value Ref Range Status   Specimen Description BLOOD RIGHT ANTECUBITAL  Final   Special Requests   Final    BOTTLES DRAWN AEROBIC AND ANAEROBIC Blood Culture adequate volume   Culture   Final    NO GROWTH 5 DAYS Performed at [facility], [address] [place], [place] [address]    Report Status 11/08/2019 FINAL  Final     Time coordinating discharge: Over 30 minutes  SIGNED:   Nicolette Bang, MD  [facility] 11/09/2019, 12:44 PM Pager   If 7PM-7AM, please contact night-coverage [email or web address] Password [email or web address]

## 2019-11-09 NOTE — TOC Progression Note (Signed)
Transition of Care [facility]) - Progression Note    Patient Details  Name: Matthew Schultz MRN: [id number] Date of Birth: 09/19/1932  Transition of Care [facility]) CM/SW Contact  Beverly Sessions, RN Phone Number: 11/09/2019, 10:41 AM  Clinical Narrative:     [place]eroy Sea with [employer] notified of order for hoyer lift  Expected Discharge Plan: [place] Barriers to Discharge: Continued Medical Work up  Expected Discharge Plan and Services Expected Discharge Plan: [place]   Discharge Planning Services: CM Consult Post Acute Care Choice: [place] arrangements for the past 2 months: Single Family Home                           HH Arranged: RN, PT, Nurse's Aide, Social Work, Refused SNF [place] Agency: [facility] [employer] Date [place]: 11/08/19 Time [place]: [place] Representative spoke with at [place]: [place] ([place]) Interventions    Readmission Risk Interventions No flowsheet data found.

## 2019-11-09 NOTE — TOC Transition Note (Signed)
Transition of Care [facility]) - CM/SW Discharge Note   Patient Details  Name: Matthew Schultz MRN: [id number] Date of Birth: [birth year]  Transition [facility]) CM/SW Contact:  Beverly Sessions, RN Phone Number: 11/09/2019, 2:21 PM   Clinical Narrative:     Patient to discharge today.  Brad with [employer] notified of order for lift.  To be delivered to home  Helene Kelp with [facility] notified of discharge  Family wants to transport at discharge.  EMS packet placed on chart should they change their mind.  Bedside RN notified  Final next level of care: Home w Home Health Services Barriers to Discharge: Barriers Resolved   Patient Goals and CMS Choice     Choice offered to / list presented to : Patient  Discharge Placement                       Discharge Plan and Services   Discharge Planning Services: CM Consult Post Acute Care Choice: Home Health          DME Arranged: Harrel Lemon) DME Agency: [employer] Date DME Agency Contacted: 11/09/19   Representative spoke with at DME Agency: [place]: RN, PT, Nurse's Aide, Social Work, Refused SNF [place]: [facility] [employer] [facility] [employer] Date [place]: 11/09/19 Time [place]: [place] Representative spoke with at [place]: [place] ([place]) Interventions     Readmission Risk Interventions No flowsheet data found.

## 2019-11-09 NOTE — Care Management Important Message (Signed)
Important Message  Patient Details  Name: KAO MIESSE MRN: [id number] Date of Birth: 07-05-1932   Medicare Important Message Given:  Yes     Dannette Barbara 11/09/2019, 1:33 PM

## 2019-11-09 NOTE — Progress Notes (Signed)
Matthew Schultz to be D/C'd to family's home by EMS per MD order.  Discussed prescriptions and follow up appointments with the patient. Prescriptions given to patient, medication list explained in detail. Pt verbalized understanding.  Allergies as of 11/09/2019   No Known Allergies      Medication List     TAKE these medications    aspirin EC 81 MG tablet Take 81 mg by mouth daily. Notes to patient: Morning 10/31/19   donepezil 5 MG tablet Commonly known as: ARICEPT Take 5 mg by mouth at bedtime. Notes to patient: Before bed 11/09/19   feeding supplement (ENSURE ENLIVE) Liqd Take 237 mLs by mouth 3 (three) times daily between meals. Notes to patient: Afternoon 11/09/19   gabapentin 300 MG capsule Commonly known as: NEURONTIN Take 300 mg by mouth at bedtime. Notes to patient: Before bed 11/09/19   hydrOXYzine 10 MG tablet Commonly known as: ATARAX/VISTARIL Take 20 mg by mouth at bedtime. Notes to patient: Before bed 11/09/19   insulin aspart 100 UNIT/ML injection Commonly known as: novoLOG 0-9 Units, Subcutaneous, 3 times daily with meals CBG < 70: implement hypoglycemia protocol CBG 70 - 120: 0 units CBG 121 - 150: 1 unit CBG 151 - 200: 2 units CBG 201 - 250: 3 units CBG 251 - 300: 5 units CBG 301 - 350: 7 units CBG 351 - 400: 9 units CBG > 400: call MD Notes to patient: With evening meal 11/09/19, according to sliding scale   levofloxacin 500 MG tablet Commonly known as: LEVAQUIN Take 1 tablet (500 mg total) by mouth daily for 5 days. Notes to patient: Morning 11/10/19   metoprolol tartrate 25 MG tablet Commonly known as: LOPRESSOR Take 25 mg by mouth 2 (two) times daily. Notes to patient: Before bed 11/09/19   omeprazole 20 MG capsule Commonly known as: PRILOSEC Take 20 mg by mouth 2 (two) times daily. Notes to patient: Before bed 11/09/19   risperiDONE 0.5 MG tablet Commonly known as: RISPERDAL Take 1 tablet (0.5 mg total) by mouth at  bedtime. Notes to patient: Before bed 11/09/19   simvastatin 40 MG tablet Commonly known as: ZOCOR Take 40 mg by mouth at bedtime. Notes to patient: Before bed 11/09/19   tamsulosin 0.4 MG Caps capsule Commonly known as: FLOMAX Take 1 capsule (0.4 mg total) by mouth daily. Notes to patient: Morning 11/09/19   traMADol 50 MG tablet Commonly known as: ULTRAM Take 1 tablet (50 mg total) by mouth every 8 (eight) hours as needed. Notes to patient: As needed   traZODone 50 MG tablet Commonly known as: DESYREL Take 50 mg by mouth at bedtime. Notes to patient: Before bed 11/09/19               Durable Medical Equipment  (From admission, onward)           Start     Ordered   11/09/19 1155  For home use only DME Other see comment  Once    Comments: Harrel Lemon lift  Question:  Length of Need  Answer:  Lifetime   11/09/19 1154            Vitals:   11/09/19 1154 11/09/19 1544  BP: 128/70 122/67  Pulse: 64 62  Resp: (!) 24   Temp: 99.3 F (37.4 C) 98.3 F (36.8 C)  SpO2: 96% 95%    Skin clean, dry and intact without evidence of skin break down, no evidence of skin tears noted. IV catheter discontinued intact.  Site without signs and symptoms of complications. Dressing and pressure applied. Pt denies pain at this time. No complaints noted.  An After Visit Summary was printed and given to the patient. Patient escorted via [place], and D/C home via private auto.  [place] A Matthew Schultz

## 2019-11-10 DIAGNOSIS — A419 Sepsis, unspecified organism: Secondary | ICD-10-CM | POA: Diagnosis not present

## 2019-11-11 DIAGNOSIS — K2980 Duodenitis without bleeding: Secondary | ICD-10-CM | POA: Diagnosis not present

## 2019-11-11 DIAGNOSIS — R1310 Dysphagia, unspecified: Secondary | ICD-10-CM | POA: Diagnosis not present

## 2019-11-11 DIAGNOSIS — J690 Pneumonitis due to inhalation of food and vomit: Secondary | ICD-10-CM | POA: Diagnosis not present

## 2019-11-11 DIAGNOSIS — E119 Type 2 diabetes mellitus without complications: Secondary | ICD-10-CM | POA: Diagnosis not present

## 2019-11-11 DIAGNOSIS — I10 Essential (primary) hypertension: Secondary | ICD-10-CM | POA: Diagnosis not present

## 2019-11-11 DIAGNOSIS — I69391 Dysphagia following cerebral infarction: Secondary | ICD-10-CM | POA: Diagnosis not present

## 2019-11-11 DIAGNOSIS — I2510 Atherosclerotic heart disease of native coronary artery without angina pectoris: Secondary | ICD-10-CM | POA: Diagnosis not present

## 2019-11-11 DIAGNOSIS — F0390 Unspecified dementia without behavioral disturbance: Secondary | ICD-10-CM | POA: Diagnosis not present

## 2019-11-11 DIAGNOSIS — N400 Enlarged prostate without lower urinary tract symptoms: Secondary | ICD-10-CM | POA: Diagnosis not present

## 2019-11-13 ENCOUNTER — Other Ambulatory Visit: Payer: Self-pay

## 2019-11-13 DIAGNOSIS — I2510 Atherosclerotic heart disease of native coronary artery without angina pectoris: Secondary | ICD-10-CM | POA: Diagnosis not present

## 2019-11-13 DIAGNOSIS — J690 Pneumonitis due to inhalation of food and vomit: Secondary | ICD-10-CM | POA: Diagnosis not present

## 2019-11-13 DIAGNOSIS — E119 Type 2 diabetes mellitus without complications: Secondary | ICD-10-CM | POA: Diagnosis not present

## 2019-11-13 DIAGNOSIS — I69391 Dysphagia following cerebral infarction: Secondary | ICD-10-CM | POA: Diagnosis not present

## 2019-11-13 DIAGNOSIS — F0390 Unspecified dementia without behavioral disturbance: Secondary | ICD-10-CM | POA: Diagnosis not present

## 2019-11-13 DIAGNOSIS — N400 Enlarged prostate without lower urinary tract symptoms: Secondary | ICD-10-CM | POA: Diagnosis not present

## 2019-11-13 DIAGNOSIS — R1310 Dysphagia, unspecified: Secondary | ICD-10-CM | POA: Diagnosis not present

## 2019-11-13 DIAGNOSIS — I10 Essential (primary) hypertension: Secondary | ICD-10-CM | POA: Diagnosis not present

## 2019-11-13 DIAGNOSIS — K2980 Duodenitis without bleeding: Secondary | ICD-10-CM | POA: Diagnosis not present

## 2019-11-13 NOTE — Patient Outreach (Signed)
[place] [facility]) Care Management  11/13/2019  Matthew Schultz Aug 28, 1932 [id number]    EMMI-GENERAL DISCHARGE RED ON EMMI ALERT Day #1 Date: 11/12/2019 Red Alert Reason: 'Scheduled follow up? No  Unfilled prescriptions?Yes"   Outreach attempt #1 to patient. Spoke with patient's daughter_Betty due to patient's documented history of dementia. She voices that patient is doing fairly well. She denies any acute issues or concerns at present. Reviewed and addressed red alert with caregiver as she has been answering automated calls. She reports that she responded that way as the hospital MD did not put patient on one of his meds but was told to follow up with PCP during follow up appt about the med. Daughter voices that otherwise she has gotten all of his meds and are administering them to him. She denies any issues or concerns regarding affording and/or managing them. She states that she has to call and make PCP follow up appt. She denies any issues with transportation. Daughter states that [facility] services are in place and have been in contact with them. She denies any RN CM needs or concerns at present and declines needing follow up.     Plan: RN CM will close case.  Enzo Montgomery, RN,BSN,CCM [facility] Telephonic Care Management Coordinator Direct Phone: [phone] Toll Free: [phone] Fax: [phone]

## 2019-11-15 DIAGNOSIS — F0390 Unspecified dementia without behavioral disturbance: Secondary | ICD-10-CM | POA: Diagnosis not present

## 2019-11-15 DIAGNOSIS — R1310 Dysphagia, unspecified: Secondary | ICD-10-CM | POA: Diagnosis not present

## 2019-11-15 DIAGNOSIS — J690 Pneumonitis due to inhalation of food and vomit: Secondary | ICD-10-CM | POA: Diagnosis not present

## 2019-11-15 DIAGNOSIS — I2510 Atherosclerotic heart disease of native coronary artery without angina pectoris: Secondary | ICD-10-CM | POA: Diagnosis not present

## 2019-11-15 DIAGNOSIS — I69391 Dysphagia following cerebral infarction: Secondary | ICD-10-CM | POA: Diagnosis not present

## 2019-11-15 DIAGNOSIS — E119 Type 2 diabetes mellitus without complications: Secondary | ICD-10-CM | POA: Diagnosis not present

## 2019-11-15 DIAGNOSIS — K2980 Duodenitis without bleeding: Secondary | ICD-10-CM | POA: Diagnosis not present

## 2019-11-15 DIAGNOSIS — I10 Essential (primary) hypertension: Secondary | ICD-10-CM | POA: Diagnosis not present

## 2019-11-15 DIAGNOSIS — N400 Enlarged prostate without lower urinary tract symptoms: Secondary | ICD-10-CM | POA: Diagnosis not present

## 2019-11-16 DIAGNOSIS — R1310 Dysphagia, unspecified: Secondary | ICD-10-CM | POA: Diagnosis not present

## 2019-11-16 DIAGNOSIS — F0390 Unspecified dementia without behavioral disturbance: Secondary | ICD-10-CM | POA: Diagnosis not present

## 2019-11-16 DIAGNOSIS — E119 Type 2 diabetes mellitus without complications: Secondary | ICD-10-CM | POA: Diagnosis not present

## 2019-11-16 DIAGNOSIS — I2510 Atherosclerotic heart disease of native coronary artery without angina pectoris: Secondary | ICD-10-CM | POA: Diagnosis not present

## 2019-11-16 DIAGNOSIS — I10 Essential (primary) hypertension: Secondary | ICD-10-CM | POA: Diagnosis not present

## 2019-11-16 DIAGNOSIS — K2980 Duodenitis without bleeding: Secondary | ICD-10-CM | POA: Diagnosis not present

## 2019-11-16 DIAGNOSIS — N400 Enlarged prostate without lower urinary tract symptoms: Secondary | ICD-10-CM | POA: Diagnosis not present

## 2019-11-16 DIAGNOSIS — I69391 Dysphagia following cerebral infarction: Secondary | ICD-10-CM | POA: Diagnosis not present

## 2019-11-16 DIAGNOSIS — J690 Pneumonitis due to inhalation of food and vomit: Secondary | ICD-10-CM | POA: Diagnosis not present

## 2019-11-17 DIAGNOSIS — J690 Pneumonitis due to inhalation of food and vomit: Secondary | ICD-10-CM | POA: Diagnosis not present

## 2019-11-17 DIAGNOSIS — I2510 Atherosclerotic heart disease of native coronary artery without angina pectoris: Secondary | ICD-10-CM | POA: Diagnosis not present

## 2019-11-17 DIAGNOSIS — R1310 Dysphagia, unspecified: Secondary | ICD-10-CM | POA: Diagnosis not present

## 2019-11-17 DIAGNOSIS — F0390 Unspecified dementia without behavioral disturbance: Secondary | ICD-10-CM | POA: Diagnosis not present

## 2019-11-17 DIAGNOSIS — N400 Enlarged prostate without lower urinary tract symptoms: Secondary | ICD-10-CM | POA: Diagnosis not present

## 2019-11-17 DIAGNOSIS — E119 Type 2 diabetes mellitus without complications: Secondary | ICD-10-CM | POA: Diagnosis not present

## 2019-11-17 DIAGNOSIS — I69391 Dysphagia following cerebral infarction: Secondary | ICD-10-CM | POA: Diagnosis not present

## 2019-11-17 DIAGNOSIS — K2980 Duodenitis without bleeding: Secondary | ICD-10-CM | POA: Diagnosis not present

## 2019-11-17 DIAGNOSIS — I10 Essential (primary) hypertension: Secondary | ICD-10-CM | POA: Diagnosis not present

## 2019-11-20 DIAGNOSIS — I10 Essential (primary) hypertension: Secondary | ICD-10-CM | POA: Diagnosis not present

## 2019-11-20 DIAGNOSIS — F0390 Unspecified dementia without behavioral disturbance: Secondary | ICD-10-CM | POA: Diagnosis not present

## 2019-11-20 DIAGNOSIS — K2980 Duodenitis without bleeding: Secondary | ICD-10-CM | POA: Diagnosis not present

## 2019-11-20 DIAGNOSIS — E119 Type 2 diabetes mellitus without complications: Secondary | ICD-10-CM | POA: Diagnosis not present

## 2019-11-20 DIAGNOSIS — R1310 Dysphagia, unspecified: Secondary | ICD-10-CM | POA: Diagnosis not present

## 2019-11-20 DIAGNOSIS — N400 Enlarged prostate without lower urinary tract symptoms: Secondary | ICD-10-CM | POA: Diagnosis not present

## 2019-11-20 DIAGNOSIS — J690 Pneumonitis due to inhalation of food and vomit: Secondary | ICD-10-CM | POA: Diagnosis not present

## 2019-11-20 DIAGNOSIS — I69391 Dysphagia following cerebral infarction: Secondary | ICD-10-CM | POA: Diagnosis not present

## 2019-11-20 DIAGNOSIS — I2510 Atherosclerotic heart disease of native coronary artery without angina pectoris: Secondary | ICD-10-CM | POA: Diagnosis not present

## 2019-11-21 DIAGNOSIS — I2510 Atherosclerotic heart disease of native coronary artery without angina pectoris: Secondary | ICD-10-CM | POA: Diagnosis not present

## 2019-11-21 DIAGNOSIS — J690 Pneumonitis due to inhalation of food and vomit: Secondary | ICD-10-CM | POA: Diagnosis not present

## 2019-11-21 DIAGNOSIS — N400 Enlarged prostate without lower urinary tract symptoms: Secondary | ICD-10-CM | POA: Diagnosis not present

## 2019-11-21 DIAGNOSIS — R1310 Dysphagia, unspecified: Secondary | ICD-10-CM | POA: Diagnosis not present

## 2019-11-21 DIAGNOSIS — F0390 Unspecified dementia without behavioral disturbance: Secondary | ICD-10-CM | POA: Diagnosis not present

## 2019-11-21 DIAGNOSIS — I10 Essential (primary) hypertension: Secondary | ICD-10-CM | POA: Diagnosis not present

## 2019-11-21 DIAGNOSIS — I69391 Dysphagia following cerebral infarction: Secondary | ICD-10-CM | POA: Diagnosis not present

## 2019-11-21 DIAGNOSIS — K2980 Duodenitis without bleeding: Secondary | ICD-10-CM | POA: Diagnosis not present

## 2019-11-21 DIAGNOSIS — E119 Type 2 diabetes mellitus without complications: Secondary | ICD-10-CM | POA: Diagnosis not present

## 2019-11-22 DIAGNOSIS — F0390 Unspecified dementia without behavioral disturbance: Secondary | ICD-10-CM | POA: Diagnosis not present

## 2019-11-22 DIAGNOSIS — K2980 Duodenitis without bleeding: Secondary | ICD-10-CM | POA: Diagnosis not present

## 2019-11-22 DIAGNOSIS — J690 Pneumonitis due to inhalation of food and vomit: Secondary | ICD-10-CM | POA: Diagnosis not present

## 2019-11-22 DIAGNOSIS — N400 Enlarged prostate without lower urinary tract symptoms: Secondary | ICD-10-CM | POA: Diagnosis not present

## 2019-11-22 DIAGNOSIS — I2510 Atherosclerotic heart disease of native coronary artery without angina pectoris: Secondary | ICD-10-CM | POA: Diagnosis not present

## 2019-11-22 DIAGNOSIS — I10 Essential (primary) hypertension: Secondary | ICD-10-CM | POA: Diagnosis not present

## 2019-11-22 DIAGNOSIS — I69391 Dysphagia following cerebral infarction: Secondary | ICD-10-CM | POA: Diagnosis not present

## 2019-11-22 DIAGNOSIS — R1310 Dysphagia, unspecified: Secondary | ICD-10-CM | POA: Diagnosis not present

## 2019-11-22 DIAGNOSIS — E119 Type 2 diabetes mellitus without complications: Secondary | ICD-10-CM | POA: Diagnosis not present

## 2019-11-27 DIAGNOSIS — I10 Essential (primary) hypertension: Secondary | ICD-10-CM | POA: Diagnosis not present

## 2019-11-27 DIAGNOSIS — I69391 Dysphagia following cerebral infarction: Secondary | ICD-10-CM | POA: Diagnosis not present

## 2019-11-27 DIAGNOSIS — N400 Enlarged prostate without lower urinary tract symptoms: Secondary | ICD-10-CM | POA: Diagnosis not present

## 2019-11-27 DIAGNOSIS — F0390 Unspecified dementia without behavioral disturbance: Secondary | ICD-10-CM | POA: Diagnosis not present

## 2019-11-27 DIAGNOSIS — J9600 Acute respiratory failure, unspecified whether with hypoxia or hypercapnia: Secondary | ICD-10-CM | POA: Diagnosis not present

## 2019-11-27 DIAGNOSIS — I2510 Atherosclerotic heart disease of native coronary artery without angina pectoris: Secondary | ICD-10-CM | POA: Diagnosis not present

## 2019-11-27 DIAGNOSIS — R1310 Dysphagia, unspecified: Secondary | ICD-10-CM | POA: Diagnosis not present

## 2019-11-27 DIAGNOSIS — E119 Type 2 diabetes mellitus without complications: Secondary | ICD-10-CM | POA: Diagnosis not present

## 2019-11-28 DIAGNOSIS — I69391 Dysphagia following cerebral infarction: Secondary | ICD-10-CM | POA: Diagnosis not present

## 2019-11-28 DIAGNOSIS — E119 Type 2 diabetes mellitus without complications: Secondary | ICD-10-CM | POA: Diagnosis not present

## 2019-11-28 DIAGNOSIS — I2510 Atherosclerotic heart disease of native coronary artery without angina pectoris: Secondary | ICD-10-CM | POA: Diagnosis not present

## 2019-11-28 DIAGNOSIS — K2980 Duodenitis without bleeding: Secondary | ICD-10-CM | POA: Diagnosis not present

## 2019-11-28 DIAGNOSIS — F0390 Unspecified dementia without behavioral disturbance: Secondary | ICD-10-CM | POA: Diagnosis not present

## 2019-11-28 DIAGNOSIS — R1310 Dysphagia, unspecified: Secondary | ICD-10-CM | POA: Diagnosis not present

## 2019-11-28 DIAGNOSIS — I10 Essential (primary) hypertension: Secondary | ICD-10-CM | POA: Diagnosis not present

## 2019-11-28 DIAGNOSIS — N400 Enlarged prostate without lower urinary tract symptoms: Secondary | ICD-10-CM | POA: Diagnosis not present

## 2019-11-28 DIAGNOSIS — J690 Pneumonitis due to inhalation of food and vomit: Secondary | ICD-10-CM | POA: Diagnosis not present

## 2019-11-29 DIAGNOSIS — R1310 Dysphagia, unspecified: Secondary | ICD-10-CM | POA: Diagnosis not present

## 2019-11-29 DIAGNOSIS — I69391 Dysphagia following cerebral infarction: Secondary | ICD-10-CM | POA: Diagnosis not present

## 2019-11-29 DIAGNOSIS — F0390 Unspecified dementia without behavioral disturbance: Secondary | ICD-10-CM | POA: Diagnosis not present

## 2019-11-29 DIAGNOSIS — K2980 Duodenitis without bleeding: Secondary | ICD-10-CM | POA: Diagnosis not present

## 2019-11-29 DIAGNOSIS — N400 Enlarged prostate without lower urinary tract symptoms: Secondary | ICD-10-CM | POA: Diagnosis not present

## 2019-11-29 DIAGNOSIS — I10 Essential (primary) hypertension: Secondary | ICD-10-CM | POA: Diagnosis not present

## 2019-11-29 DIAGNOSIS — E119 Type 2 diabetes mellitus without complications: Secondary | ICD-10-CM | POA: Diagnosis not present

## 2019-11-29 DIAGNOSIS — J690 Pneumonitis due to inhalation of food and vomit: Secondary | ICD-10-CM | POA: Diagnosis not present

## 2019-11-29 DIAGNOSIS — I2510 Atherosclerotic heart disease of native coronary artery without angina pectoris: Secondary | ICD-10-CM | POA: Diagnosis not present

## 2019-12-08 IMAGING — DX DG CHEST 1V PORT
1 series · 1 of 1 positions shown · non-contrast
Comparison: September 25, 2018

CLINICAL DATA: Cough and congestion.

EXAM:
PORTABLE CHEST 1 VIEW

[chest ap]
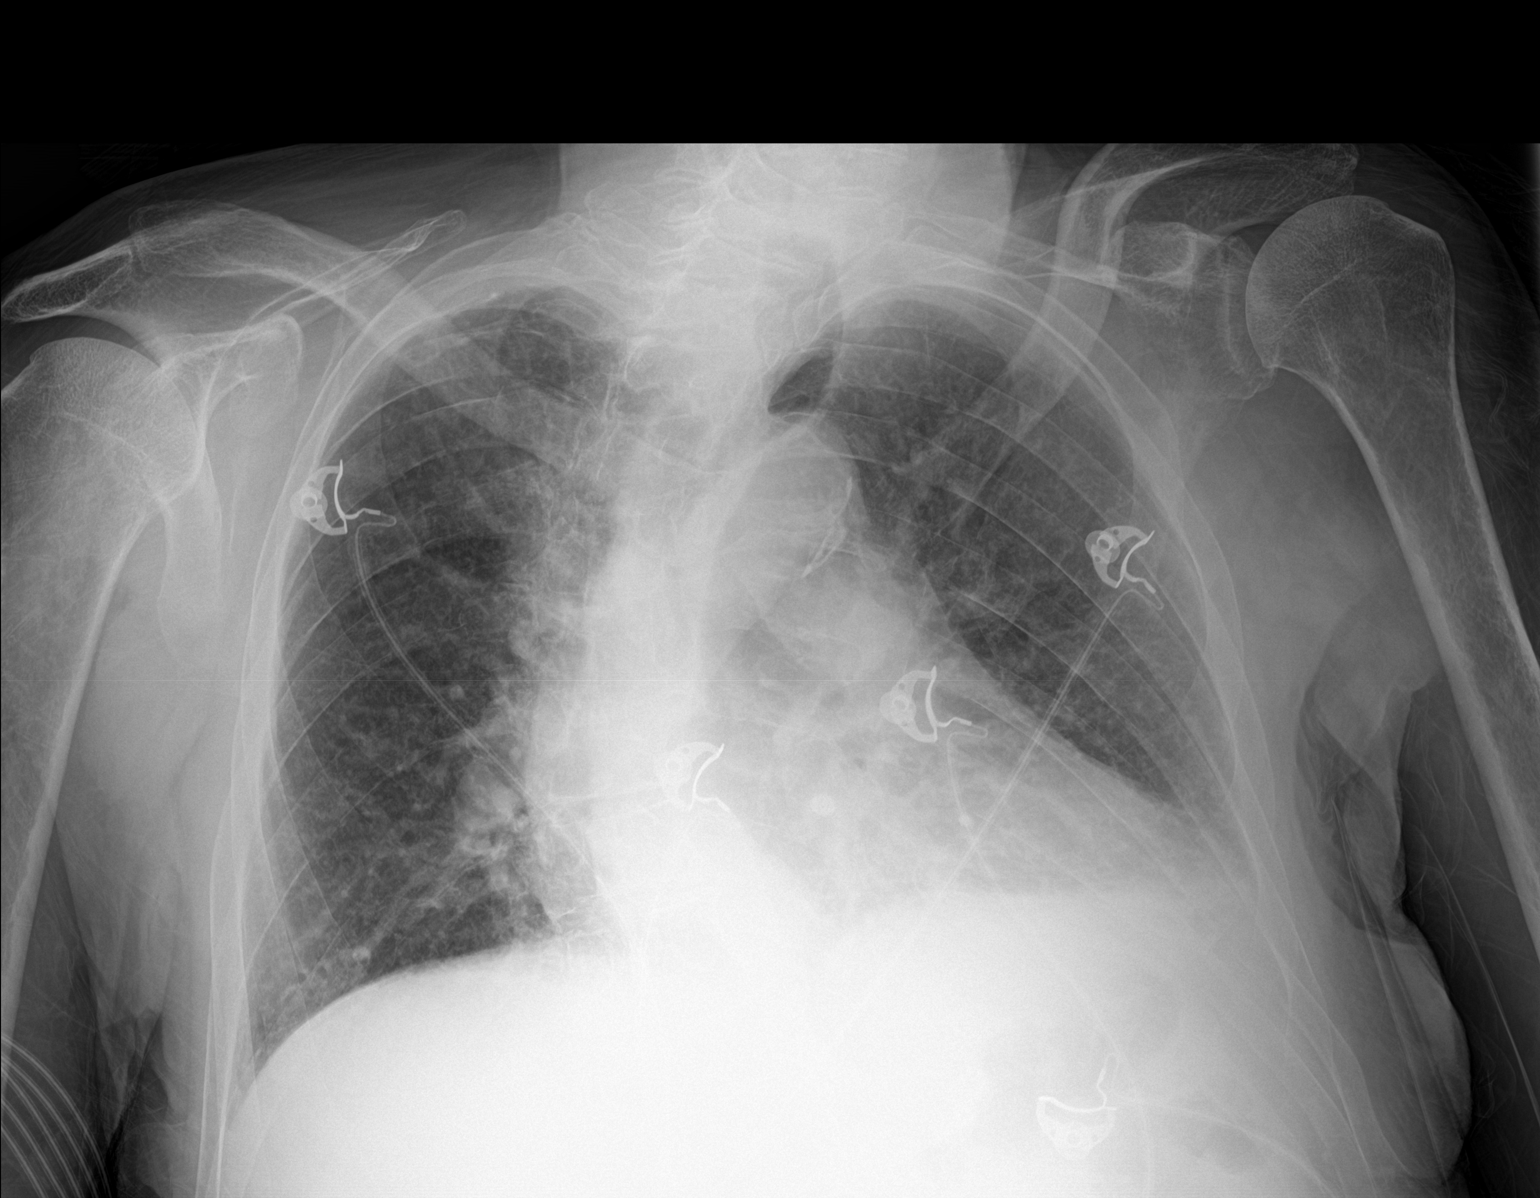

[1 of 1 positions shown; findings below may reference images not displayed]

FINDINGS: Scoliotic curvature of the thoracic spine. Atelectasis in the left
base. The heart, hila, mediastinum, lungs, and pleura are otherwise
unchanged.
IMPRESSION: No active disease.

## 2019-12-08 IMAGING — CT CT HEAD W/O CM
3 series · 15 of 47 positions shown, 18 images · non-contrast
Comparison: MRI brain 11/29/2013, CT brain 11/28/2013

CLINICAL DATA: Altered LOC

EXAM:
CT HEAD WITHOUT CONTRAST
TECHNIQUE: Contiguous axial images were obtained from the base of the skull
through the vertex without intravenous contrast.

[Series 2: head wo · axial · 0.47mm/px · z∈[-141,-16]mm · 9 of 31 slices shown, 12 images]
[im 3/31  brain]
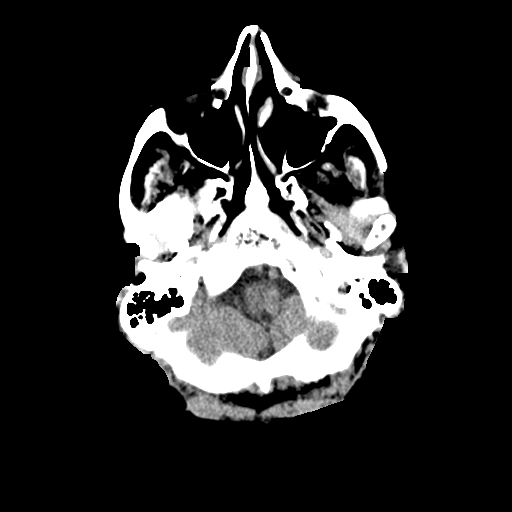
[im 3/31  bone]
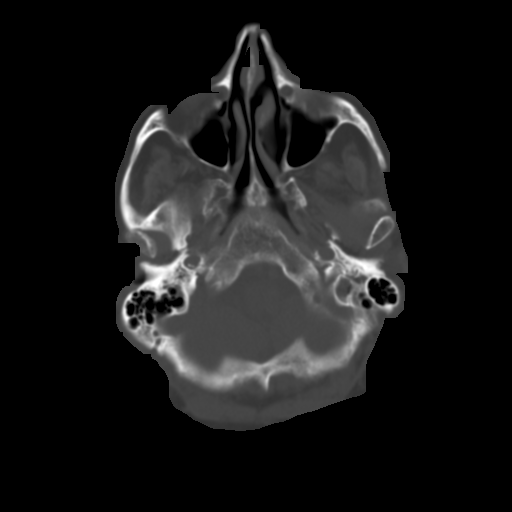
[im 6/31  brain]
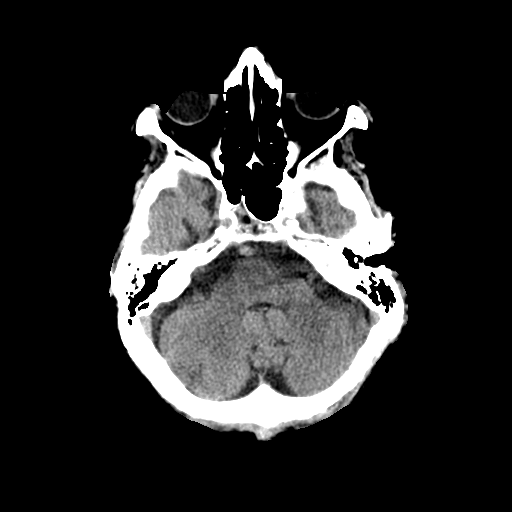
[im 9/31  brain]
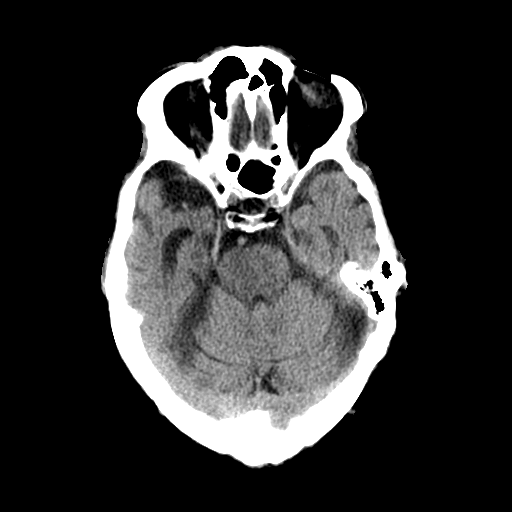
[im 12/31  brain]
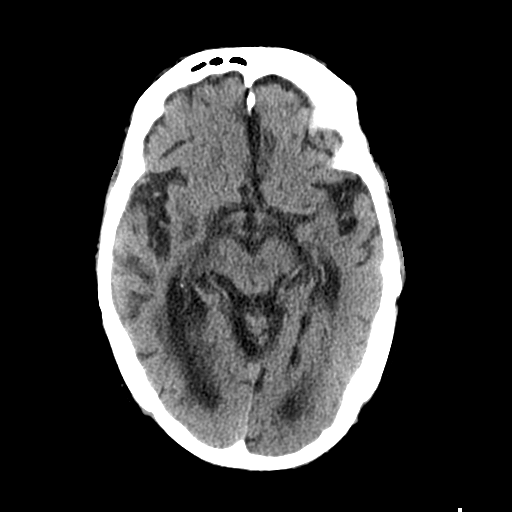
[im 16/31  brain]
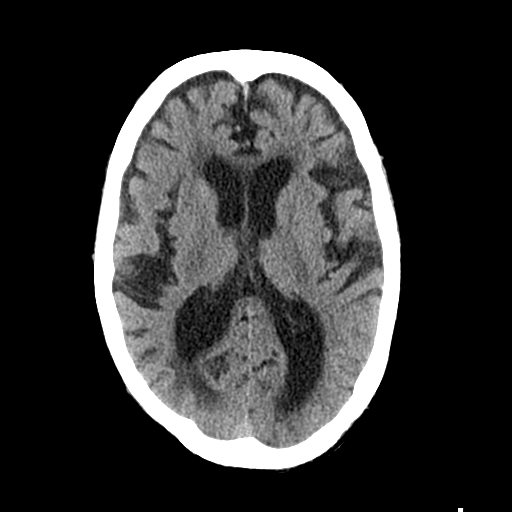
[im 16/31  bone]
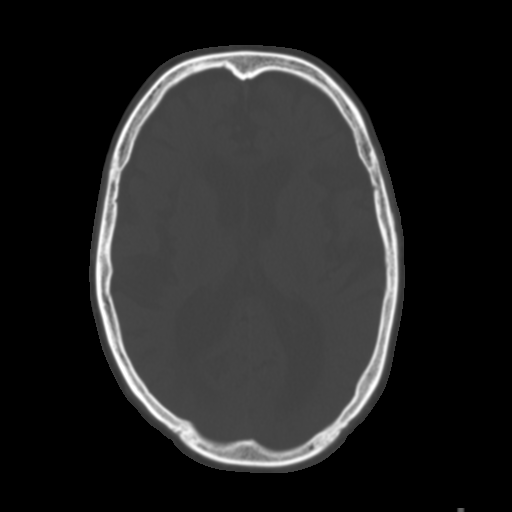
[im 19/31  brain]
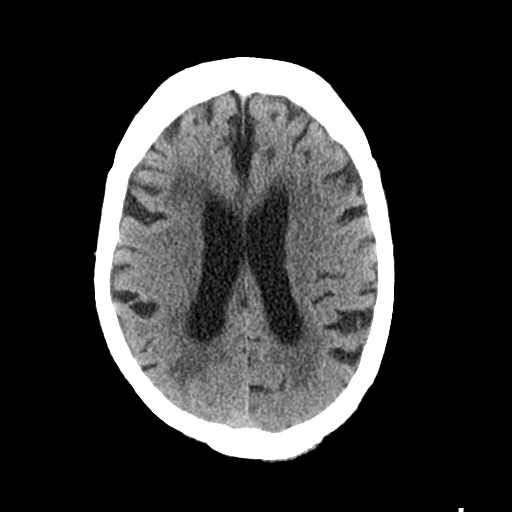
[im 22/31  brain]
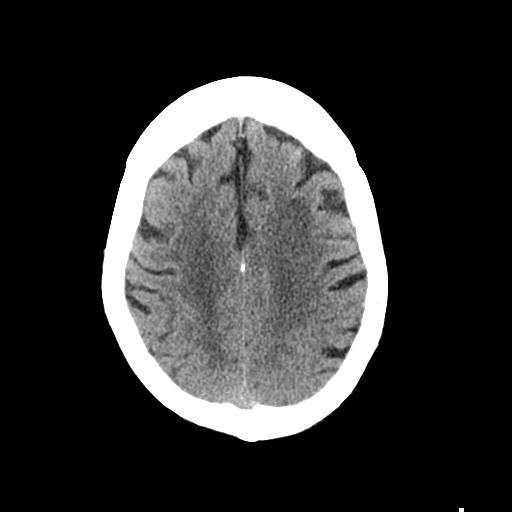
[im 25/31  brain]
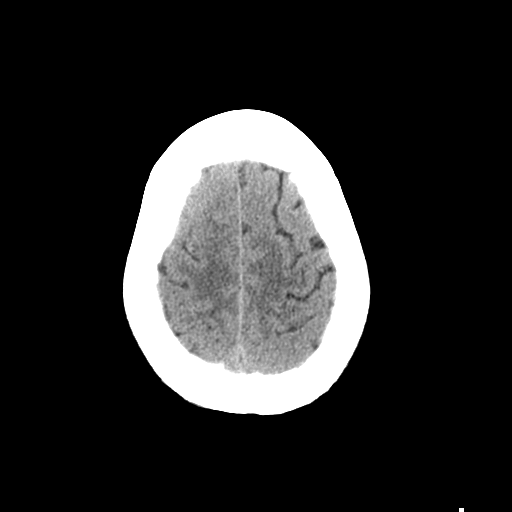
[im 28/31  brain]
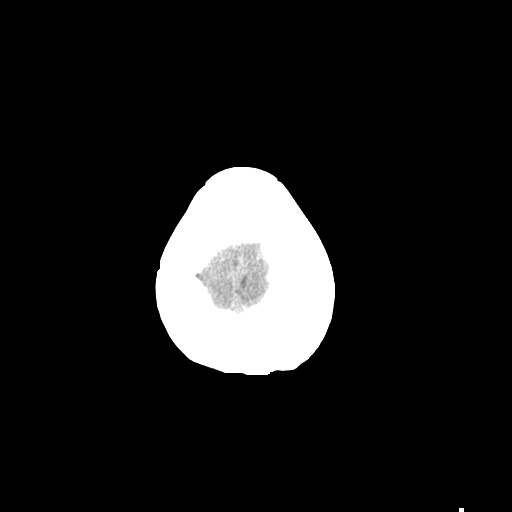
[im 28/31  bone]
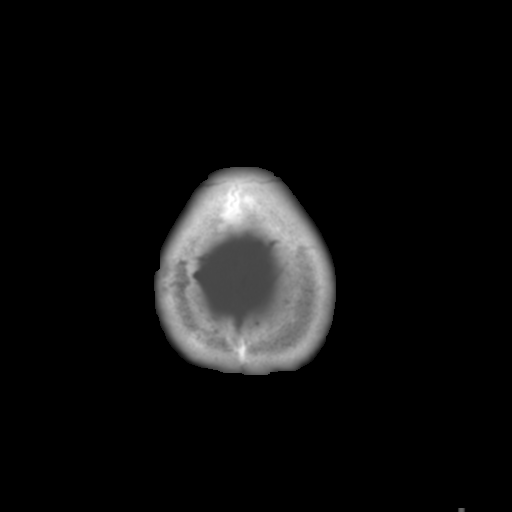

[Series 4: coronal soft tissue · coronal · 0.29mm/px · 3 of 73 slices shown]
[im 25/73  brain]
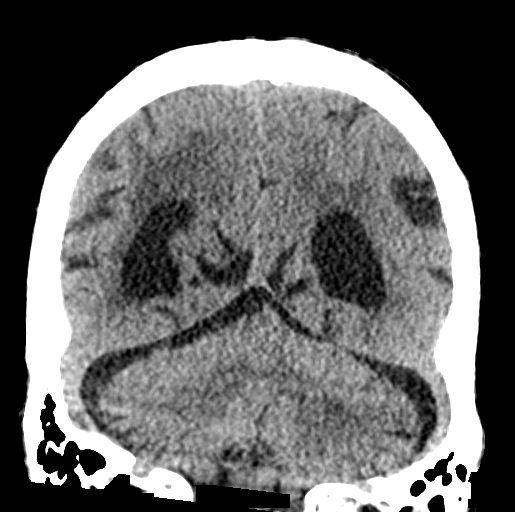
[im 33/73  brain]
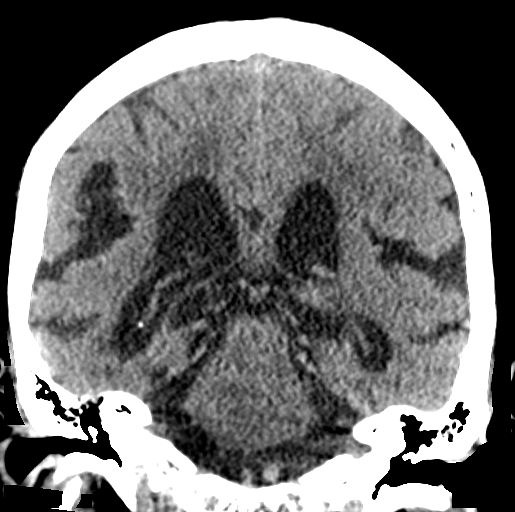
[im 41/73  brain]
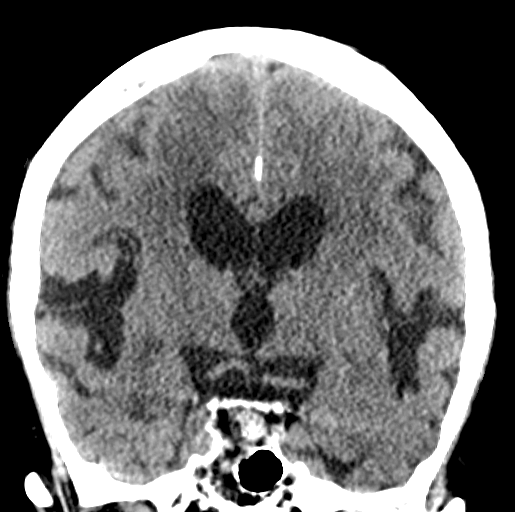

[Series 5: sagittal soft tissue · sagittal · 0.28mm/px · 3 of 51 slices shown]
[im 17/51  brain]
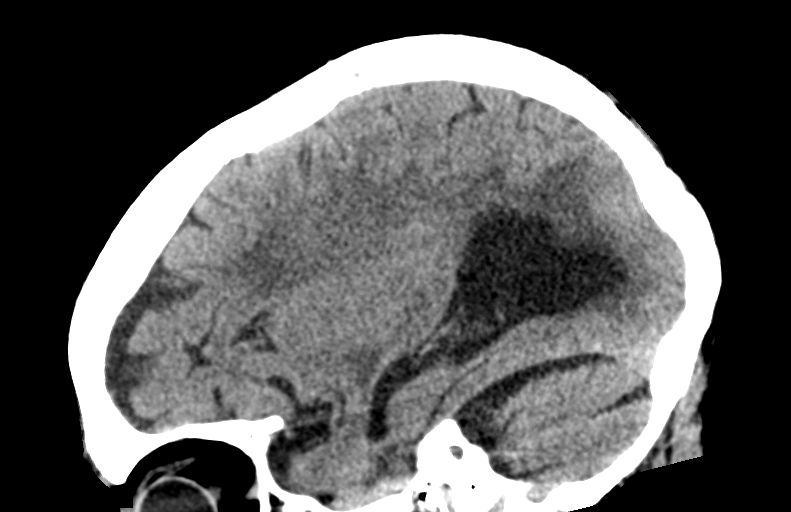
[im 26/51  brain]
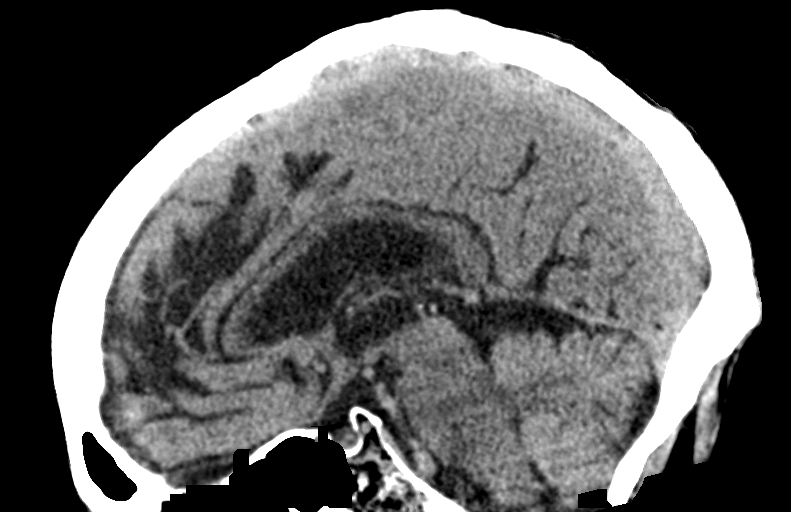
[im 34/51  brain]
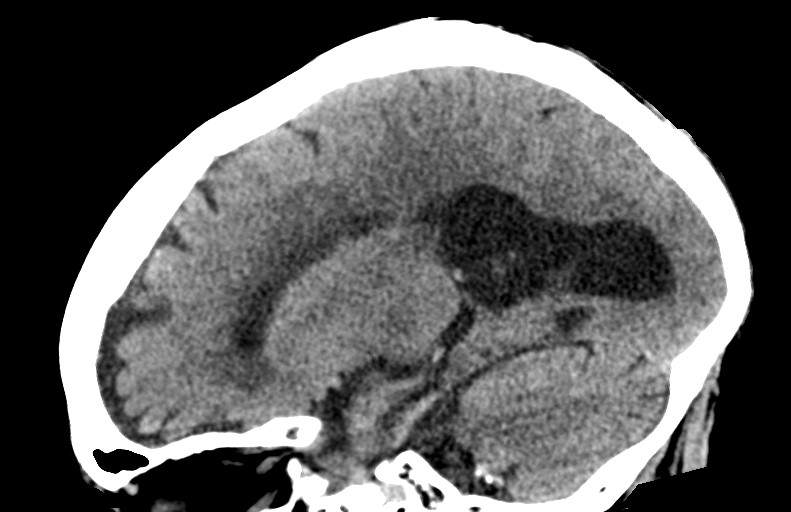

[15 of 47 positions shown; findings below may reference images not displayed]

FINDINGS: Brain: No acute territorial infarction, hemorrhage or intracranial
mass is visualized. Moderate-to-marked atrophy. Moderate small
vessel ischemic changes of the white matter. Stable ventricle size.

Vascular: No hyperdense vessel.  Carotid vascular calcification

Skull: No fracture. Stable heterogenous lucency in the right
parietal bone.

Sinuses/Orbits: No acute finding.

Other: None
IMPRESSION: 1. No CT evidence for acute intracranial abnormality.
2. Atrophy with small vessel ischemic changes of the white matter.

## 2019-12-10 DIAGNOSIS — A419 Sepsis, unspecified organism: Secondary | ICD-10-CM | POA: Diagnosis not present

## 2019-12-29 DEATH — deceased
